# Patient Record
Sex: Female | Born: 1960 | Race: White | Hispanic: No | Marital: Married | Smoking: Former smoker
Health system: Southern US, Community
[De-identification: ages and names within clinical notes are randomized; demographics above are authoritative.]

## PROBLEM LIST (undated history)

## (undated) DIAGNOSIS — K219 Gastro-esophageal reflux disease without esophagitis: Secondary | ICD-10-CM

## (undated) DIAGNOSIS — E785 Hyperlipidemia, unspecified: Secondary | ICD-10-CM

## (undated) DIAGNOSIS — F419 Anxiety disorder, unspecified: Secondary | ICD-10-CM

## (undated) DIAGNOSIS — I10 Essential (primary) hypertension: Secondary | ICD-10-CM

## (undated) DIAGNOSIS — I493 Ventricular premature depolarization: Secondary | ICD-10-CM

## (undated) DIAGNOSIS — Z98891 History of uterine scar from previous surgery: Secondary | ICD-10-CM

## (undated) DIAGNOSIS — R002 Palpitations: Secondary | ICD-10-CM

## (undated) DIAGNOSIS — Z9889 Other specified postprocedural states: Secondary | ICD-10-CM

## (undated) DIAGNOSIS — R112 Nausea with vomiting, unspecified: Secondary | ICD-10-CM

## (undated) DIAGNOSIS — Z8719 Personal history of other diseases of the digestive system: Secondary | ICD-10-CM

## (undated) DIAGNOSIS — I491 Atrial premature depolarization: Secondary | ICD-10-CM

## (undated) DIAGNOSIS — Z9049 Acquired absence of other specified parts of digestive tract: Secondary | ICD-10-CM

## (undated) DIAGNOSIS — M47819 Spondylosis without myelopathy or radiculopathy, site unspecified: Secondary | ICD-10-CM

## (undated) DIAGNOSIS — B1920 Unspecified viral hepatitis C without hepatic coma: Secondary | ICD-10-CM

## (undated) HISTORY — DX: Gastro-esophageal reflux disease without esophagitis: K21.9

## (undated) HISTORY — DX: Spondylosis without myelopathy or radiculopathy, site unspecified: M47.819

## (undated) HISTORY — DX: Hyperlipidemia, unspecified: E78.5

## (undated) HISTORY — DX: Acquired absence of other specified parts of digestive tract: Z90.49

## (undated) HISTORY — DX: Atrial premature depolarization: I49.1

## (undated) HISTORY — DX: Ventricular premature depolarization: I49.3

## (undated) HISTORY — DX: History of uterine scar from previous surgery: Z98.891

## (undated) HISTORY — PX: CHOLECYSTECTOMY: SHX55

---

## 1997-08-06 ENCOUNTER — Other Ambulatory Visit: Admission: RE | Admit: 1997-08-06 | Discharge: 1997-08-06 | Payer: Self-pay | Admitting: Obstetrics and Gynecology

## 1998-08-26 ENCOUNTER — Other Ambulatory Visit: Admission: RE | Admit: 1998-08-26 | Discharge: 1998-08-26 | Payer: Self-pay | Admitting: Obstetrics and Gynecology

## 1998-09-03 ENCOUNTER — Encounter: Payer: Self-pay | Admitting: Emergency Medicine

## 1998-09-03 ENCOUNTER — Emergency Department (HOSPITAL_COMMUNITY): Admission: EM | Admit: 1998-09-03 | Discharge: 1998-09-03 | Payer: Self-pay | Admitting: Emergency Medicine

## 1999-02-17 ENCOUNTER — Encounter: Payer: Self-pay | Admitting: Emergency Medicine

## 1999-02-17 ENCOUNTER — Emergency Department (HOSPITAL_COMMUNITY): Admission: EM | Admit: 1999-02-17 | Discharge: 1999-02-17 | Payer: Self-pay | Admitting: Emergency Medicine

## 1999-11-06 ENCOUNTER — Other Ambulatory Visit: Admission: RE | Admit: 1999-11-06 | Discharge: 1999-11-06 | Payer: Self-pay | Admitting: Obstetrics and Gynecology

## 2000-12-18 ENCOUNTER — Other Ambulatory Visit: Admission: RE | Admit: 2000-12-18 | Discharge: 2000-12-18 | Payer: Self-pay | Admitting: Obstetrics and Gynecology

## 2002-01-06 ENCOUNTER — Other Ambulatory Visit: Admission: RE | Admit: 2002-01-06 | Discharge: 2002-01-06 | Payer: Self-pay | Admitting: Obstetrics and Gynecology

## 2002-04-28 ENCOUNTER — Ambulatory Visit (HOSPITAL_COMMUNITY): Admission: RE | Admit: 2002-04-28 | Discharge: 2002-04-28 | Payer: Self-pay | Admitting: Gastroenterology

## 2002-05-13 ENCOUNTER — Encounter: Payer: Self-pay | Admitting: Gastroenterology

## 2002-05-13 ENCOUNTER — Ambulatory Visit (HOSPITAL_COMMUNITY): Admission: RE | Admit: 2002-05-13 | Discharge: 2002-05-13 | Payer: Self-pay | Admitting: Gastroenterology

## 2002-06-16 ENCOUNTER — Encounter: Payer: Self-pay | Admitting: General Surgery

## 2002-06-18 ENCOUNTER — Encounter (INDEPENDENT_AMBULATORY_CARE_PROVIDER_SITE_OTHER): Payer: Self-pay | Admitting: Specialist

## 2002-06-18 ENCOUNTER — Ambulatory Visit (HOSPITAL_COMMUNITY): Admission: RE | Admit: 2002-06-18 | Discharge: 2002-06-19 | Payer: Self-pay | Admitting: General Surgery

## 2002-06-18 ENCOUNTER — Encounter: Payer: Self-pay | Admitting: General Surgery

## 2002-09-21 ENCOUNTER — Emergency Department (HOSPITAL_COMMUNITY): Admission: EM | Admit: 2002-09-21 | Discharge: 2002-09-22 | Payer: Self-pay | Admitting: Emergency Medicine

## 2002-12-17 ENCOUNTER — Emergency Department (HOSPITAL_COMMUNITY): Admission: EM | Admit: 2002-12-17 | Discharge: 2002-12-18 | Payer: Self-pay | Admitting: *Deleted

## 2002-12-18 ENCOUNTER — Encounter: Payer: Self-pay | Admitting: Emergency Medicine

## 2003-03-30 ENCOUNTER — Other Ambulatory Visit: Admission: RE | Admit: 2003-03-30 | Discharge: 2003-03-30 | Payer: Self-pay | Admitting: Obstetrics and Gynecology

## 2003-08-16 ENCOUNTER — Encounter: Admission: RE | Admit: 2003-08-16 | Discharge: 2003-08-16 | Payer: Self-pay | Admitting: Gastroenterology

## 2004-06-13 ENCOUNTER — Other Ambulatory Visit: Admission: RE | Admit: 2004-06-13 | Discharge: 2004-06-13 | Payer: Self-pay | Admitting: Obstetrics and Gynecology

## 2005-05-29 ENCOUNTER — Encounter: Admission: RE | Admit: 2005-05-29 | Discharge: 2005-05-29 | Payer: Self-pay | Admitting: Gastroenterology

## 2006-06-16 ENCOUNTER — Emergency Department (HOSPITAL_COMMUNITY): Admission: EM | Admit: 2006-06-16 | Discharge: 2006-06-17 | Payer: Self-pay | Admitting: Emergency Medicine

## 2008-03-01 ENCOUNTER — Encounter: Admission: RE | Admit: 2008-03-01 | Discharge: 2008-03-01 | Payer: Self-pay | Admitting: Obstetrics and Gynecology

## 2009-04-14 ENCOUNTER — Encounter: Admission: RE | Admit: 2009-04-14 | Discharge: 2009-05-11 | Payer: Self-pay | Admitting: Orthopaedic Surgery

## 2010-09-01 NOTE — Op Note (Signed)
   NAME:  Caroline Keller, Caroline Keller                            ACCOUNT NO.:  [id number]   MEDICAL RECORD NO.:  [id number]                   PATIENT TYPE:  AMB   LOCATION:  ENDO                                 FACILITY:  [facility]   PHYSICIAN:  Danise Edge, M.D.                DATE OF BIRTH:  [birth year]   DATE OF PROCEDURE:  04/28/2002  DATE OF DISCHARGE:                                 OPERATIVE REPORT   PROCEDURE INDICATION:  The patient is a 50 year old female born December 07, 1960.  The patient has unexplained right upper quadrant abdominal pressure  discomfort.  Her abdominal ultrasound was normal.   ENDOSCOPIST:  Danise Edge, M.D.   PREMEDICATION:  Versed 10 mg, Demerol 50 mg.   ENDOSCOPE:  Olympus gastroscope.   DESCRIPTION OF PROCEDURE:  After obtaining informed consent, the patient was  placed in the left lateral decubitus position.  I administered intravenous  Demerol and intravenous Versed to achieve conscious sedation for the  procedure.  The patient's blood pressure, oxygen saturation and cardiac  rhythm were monitored throughout the procedure and documented in the medical  record.   The Olympus gastroscope was passed through the posterior hypopharynx into  the proximal esophagus without difficulty.  The hypopharynx, larynx and  vocal cords appeared normal.   Esophagoscopy:  The proximal, mid and lower segments of the esophagus appear  normal.   Gastroscopy:  Retroflexed view of the gastric cardia and fundus was normal.  The gastric body, antrum and pylorus appeared normal.   Duodenoscopy:  The duodenal bulb and descending duodenum appeared normal.   Biopsy:  A biopsy was taken from the distal gastric antrum for CLOtest to  rule out Helicobacter pylori antral gastritis.    ASSESSMENT:  Normal esophagogastroduodenoscopy.  CLOtest to rule out  Helicobacter pylori antral gastritis pending.                                                Danise Edge, M.D.    MJ/MEDQ   D:  04/28/2002  T:  04/28/2002  Job:  [id number]   cc:   Dellis Anes. Idell Pickles, M.D.  [address]  [place]  [place] [address]  Fax: [phone]

## 2010-09-01 NOTE — Op Note (Signed)
NAME:  Caroline Keller, Caroline Keller                            ACCOUNT NO.:  [id number]   MEDICAL RECORD NO.:  [id number]                   PATIENT TYPE:  OIB   LOCATION:  5707                                 FACILITY:  [facility]   PHYSICIAN:  Ollen Gross. Vernell Morgans, M.D.              DATE OF BIRTH:  July 22, 1960   DATE OF PROCEDURE:  06/18/2002  DATE OF DISCHARGE:  06/19/2002                                 OPERATIVE REPORT   PREOPERATIVE DIAGNOSIS:  Cholecystitis with cholelithiasis and hepatitis.   POSTOPERATIVE DIAGNOSIS:  Cholecystitis with cholelithiasis and hepatitis.   PROCEDURE:  Laparoscopic cholecystectomy with intraoperative cholangiogram  and liver biopsy.   SURGEON:  Ollen Gross. Carolynne Edouard, M.D.   ASSISTANT:  Velora Heckler, M.D.   ANESTHESIA:  General endotracheal.   DESCRIPTION OF PROCEDURE:  After informed consent was obtained, the patient  was brought to the operating room and placed in the supine position on the  operating room table.  After adequate induction of general anesthesia, the  patient's abdomen was prepped with Betadine and draped in the usual sterile  manner.  The area below the umbilicus was infiltrated with 0.25% Marcaine  and a small incision was made with a 15 blade knife.  This incision was  carried down through the subcutaneous tissue bluntly using the Kelly clamp  and [employer] retractors until the linea alba was identified.  The linea  alba was incised with a 15 blade knife and each side was grasped with Kocher  clamps and elevated anteriorly.  The preperitoneal space was entered bluntly  with a hemostat until the peritoneum was opened and access was gained to the  abdominal cavity.  A 0 Vicryl pursestring stitch was then placed in the  fascia surrounding the opening.  A Hasson cannula was placed through the  opening and anchored in place with the previously-placed Vicryl pursestring  stitch.  The abdomen was then insufflated with carbon dioxide without  difficulty.  The  patient was placed in the head-up position, the laparoscope  was placed through the Hasson cannula, and the dome of the gallbladder and  liver were readily identifiable.  The liver had a normal appearance.  The  epigastric region was then infiltrated with 0.25% Marcaine and a small  incision was made with a 15 blade knife.  A 10 mm port bluntly through this  incision into the abdominal cavity under direct vision.  Sites were then  chosen laterally on the right side of the abdomen with placement of 5 mm  ports.  Each of these areas was infiltrated with 0.25% Marcaine and small  stab incisions were made with a 15 blade knife, and the 5 mm ports were  placed bluntly through these incisions into the abdominal cavity under  direct vision.  A stone-grabbing device was placed through the epigastric  port and grasped the edge of the liver next to the dome  of the gallbladder,  and a small chunk of liver was removed for liver biopsy.  This area was then  coagulated with the electrocautery until it was hemostatic.  A 5 mm port was  then placed bluntly through the most lateral 5 mm port and used to grasp the  dome of the gallbladder and elevate it anteriorly and superiorly.  Another  blunt grasper was placed through the other 5 mm port and used to retract on  the body and neck of the gallbladder.  A dissector was placed through the  epigastric port and using the electrocautery, the peritoneal reflection of  the gallbladder neck was opened and blunt dissection then carried out in  this area until the gallbladder neck-cystic duct junction was readily  identified and a good window was created.  A single clip was placed on the  gallbladder neck.  A 14-gauge Angiocath was placed percutaneously through  the anterior abdominal wall under direct vision.  A Reddick cholangiogram  catheter was then placed through the Angiocath and flushed.  A small  ductotomy was made with the laparoscopic scissors just proximal  to the clip.  The Reddick catheter was placed within the cystic duct and anchored in place  with the clip.  A cholangiogram was obtained that showed no filling defects,  good length on the cystic duct, and rapid emptying into the duodenum.  The  anchoring clip and catheters were then removed from the patient and three  clips were placed proximally in the cystic duct and the duct was divided  between the two sets of clips.  Posterior to this the cystic artery was  identified and again dissected bluntly in a circumferential manner until a  good window was created.  Two clips were placed proximally and one distally  in the artery and the artery was divided between the two.  Next the  laparoscopic hook cautery device was used to separate the gallbladder from  the liver bed prior to completely detaching the gallbladder from the liver  bed.  The liver bed was inspected and several small bleeding points were  coagulated with the electrocautery.  The gallbladder was then detached the  rest of the way from the liver bed with the electrocautery without  difficulty.  An endoscopic bag was placed through the epigastric port and  the gallbladder was placed within the bag, and the bag was sealed.  The  abdomen was then irrigated with copious amounts of saline until the effluent  was clear, and the laparoscope was then moved to the upper midline port and  the gallbladder grasper was placed through the Hasson cannula and grasped  the opening of the bag.  The bag with the gallbladder was removed through  the infraumbilical port without difficulty.  The fascial defect was closed  with the previously-placed Vicryl pursestring stitch.  The rest of the ports  were removed under direct vision.  The gas was allowed to escape.  The skin  incisions were then all closed with interrupted 4-0 Monocryl subcuticular  stitches.  Benzoin and Steri-Strips were applied.  The patient tolerated the procedure well.  At the end  of the case all needle, sponge, and instrument  counts were correct.  The patient was awakened and taken to the recovery  room in stable condition.  Ollen Gross. Vernell Morgans, M.D.    PST/MEDQ  D:  07/01/2002  T:  07/01/2002  Job:  [id number]

## 2011-03-30 ENCOUNTER — Other Ambulatory Visit: Payer: Self-pay | Admitting: Family Medicine

## 2011-03-30 DIAGNOSIS — R1011 Right upper quadrant pain: Secondary | ICD-10-CM

## 2011-04-02 ENCOUNTER — Ambulatory Visit
Admission: RE | Admit: 2011-04-02 | Discharge: 2011-04-02 | Disposition: A | Discharge status: Home / Self Care | Payer: BC Managed Care – PPO | Source: Ambulatory Visit | Attending: Family Medicine | Admitting: Family Medicine

## 2011-04-02 DIAGNOSIS — R1011 Right upper quadrant pain: Secondary | ICD-10-CM

## 2011-04-18 ENCOUNTER — Encounter: Payer: Self-pay | Admitting: *Deleted

## 2011-04-18 ENCOUNTER — Emergency Department (HOSPITAL_COMMUNITY): Payer: BC Managed Care – PPO

## 2011-04-18 ENCOUNTER — Emergency Department (HOSPITAL_COMMUNITY)
Admission: EM | Admit: 2011-04-18 | Discharge: 2011-04-18 | Disposition: A | Discharge status: Home / Self Care | Payer: BC Managed Care – PPO | Attending: Emergency Medicine | Admitting: Emergency Medicine

## 2011-04-18 DIAGNOSIS — I10 Essential (primary) hypertension: Secondary | ICD-10-CM | POA: Insufficient documentation

## 2011-04-18 DIAGNOSIS — R109 Unspecified abdominal pain: Secondary | ICD-10-CM | POA: Insufficient documentation

## 2011-04-18 DIAGNOSIS — Z9089 Acquired absence of other organs: Secondary | ICD-10-CM | POA: Insufficient documentation

## 2011-04-18 DIAGNOSIS — Z8619 Personal history of other infectious and parasitic diseases: Secondary | ICD-10-CM | POA: Insufficient documentation

## 2011-04-18 HISTORY — DX: Essential (primary) hypertension: I10

## 2011-04-18 HISTORY — DX: Unspecified viral hepatitis C without hepatic coma: B19.20

## 2011-04-18 LAB — HEPATIC FUNCTION PANEL
ALT: 24 U/L (ref 0–35)
AST: 21 U/L (ref 0–37)
Albumin: 4.6 g/dL (ref 3.5–5.2)
Total Bilirubin: 0.3 mg/dL (ref 0.3–1.2)

## 2011-04-18 LAB — BASIC METABOLIC PANEL
Calcium: 10.3 mg/dL (ref 8.4–10.5)
Creatinine, Ser: 0.89 mg/dL (ref 0.50–1.10)
GFR calc non Af Amer: 73 mL/min — ABNORMAL LOW (ref >90)
Sodium: 136 mEq/L (ref 135–145)

## 2011-04-18 LAB — CBC
Hemoglobin: 13.6 g/dL (ref 12.0–15.0)
MCHC: 35.2 g/dL (ref 30.0–36.0)
MCV: 87.1 fL (ref 78.0–100.0)
Platelets: 208 10*3/uL (ref 150–400)
RDW: 11.9 % (ref 11.5–15.5)
WBC: 6.4 10*3/uL (ref 4.0–10.5)

## 2011-04-18 LAB — URINALYSIS, ROUTINE W REFLEX MICROSCOPIC
Bilirubin Urine: NEGATIVE
Hgb urine dipstick: NEGATIVE
Leukocytes, UA: NEGATIVE
Nitrite: NEGATIVE
Specific Gravity, Urine: 1.018 (ref 1.005–1.030)

## 2011-04-18 LAB — LIPASE, BLOOD: Lipase: 47 U/L (ref 11–59)

## 2011-04-18 LAB — DIFFERENTIAL
Eosinophils Relative: 0 % (ref 0–5)
Lymphocytes Relative: 21 % (ref 12–46)
Monocytes Relative: 6 % (ref 3–12)
Neutro Abs: 4.6 10*3/uL (ref 1.7–7.7)
Neutrophils Relative %: 73 % (ref 43–77)

## 2011-04-18 MED ORDER — MORPHINE SULFATE 4 MG/ML IJ SOLN
4.0000 mg | Freq: Once | INTRAMUSCULAR | Status: AC
  Administered 2011-04-18: 4 mg via INTRAVENOUS
  Filled 2011-04-18: qty 1

## 2011-04-18 MED ORDER — SODIUM CHLORIDE 0.9 % IV SOLN
Freq: Once | INTRAVENOUS | Status: AC
  Administered 2011-04-18: 15:00:00 via INTRAVENOUS

## 2011-04-18 MED ORDER — ONDANSETRON HCL 4 MG/2ML IJ SOLN
4.0000 mg | Freq: Once | INTRAMUSCULAR | Status: AC
  Administered 2011-04-18: 4 mg via INTRAVENOUS
  Filled 2011-04-18: qty 2

## 2011-04-18 MED ORDER — OXYCODONE-ACETAMINOPHEN 5-325 MG PO TABS: 1.0000 | ORAL_TABLET | Freq: Four times a day (QID) | ORAL | Status: AC | PRN

## 2011-04-18 NOTE — ED Notes (Signed)
Pt states "approx 4 wks ago I saw my doctor & told her my stomach was hurting, I've already had my gallbladder out, she ran blood tests, ordered an [place], , my numbers for my Hep C are up a little bit, I have an appt with Dr. Randa Evens 1/23 but the pain on the right flank is getting worse"

## 2011-04-18 NOTE — ED Notes (Signed)
Pt states has a hx Hep C, does not want any information shared with children if they happen to be in the room.

## 2011-04-18 NOTE — ED Provider Notes (Addendum)
History     CSN: [id number]  Arrival date & time 04/18/11  1137   First MD Initiated Contact with Patient 04/18/11 1359      Chief Complaint  Patient presents with  . Abdominal Pain    (Consider location/radiation/quality/duration/timing/severity/associated sxs/prior treatment) HPI Comments: Four week history of ruq abd pain.  She has a history of lap chole in the past.  No n/v/d.  No fevers or chills.    Patient is a 51 y.o. female presenting with abdominal pain. The history is provided by the patient.  Abdominal Pain The primary symptoms of the illness include abdominal pain. The primary symptoms of the illness do not include fever, fatigue, nausea, vomiting, diarrhea or dysuria. Episode onset: one month ago. The onset of the illness was gradual. The problem has been gradually worsening.  The patient has not had a change in bowel habit. Symptoms associated with the illness do not include chills or constipation.    Past Medical History  Diagnosis Date  . Hepatitis C   . Hypertension     Past Surgical History  Procedure Date  . Cholecystectomy   . Cesarean section     No family history on file.  History  Substance Use Topics  . Smoking status: Never Smoker   . Smokeless tobacco: Not on file  . Alcohol Use: No    OB History    Grav Para Term Preterm Abortions TAB SAB Ect Mult Living                  Review of Systems  Constitutional: Negative for fever, chills and fatigue.  Gastrointestinal: Positive for abdominal pain. Negative for nausea, vomiting, diarrhea and constipation.  Genitourinary: Negative for dysuria.  All other systems reviewed and are negative.    Allergies  Review of patient's allergies indicates no known allergies.  Home Medications   Current Outpatient Rx  Name Route Sig Dispense Refill  . DILTIAZEM HCL ER COATED BEADS 360 MG PO CP24 Oral Take 360 mg by mouth every morning.      Marland Kitchen MICARDIS HCT PO Oral Take 1 tablet by mouth daily. Pt  takes micardis 80mg  /hctz combo medication. Not sure  of dosage for hctz       BP 106/69  Pulse 83  Temp(Src) 97.5 F (36.4 C) (Oral)  Resp 20  Wt 150 lb (68.04 kg)  SpO2 100%  Physical Exam  Nursing note and vitals reviewed. Constitutional: She is oriented to person, place, and time. She appears well-developed and well-nourished. No distress.  HENT:  Head: Normocephalic and atraumatic.  Neck: Normal range of motion. Neck supple.  Cardiovascular: Normal rate and regular rhythm.  Exam reveals no gallop and no friction rub.   No murmur heard. Pulmonary/Chest: Effort normal and breath sounds normal. No respiratory distress. She has no wheezes.  Abdominal: Soft. Bowel sounds are normal. She exhibits no distension.       There is ttp in the ruq and right flank.    Musculoskeletal: Normal range of motion.  Neurological: She is alert and oriented to person, place, and time.  Skin: Skin is warm and dry. She is not diaphoretic.    ED Course  Procedures (including critical care time)  Labs Reviewed  BASIC METABOLIC PANEL - Abnormal; Notable for the following:    GFR calc non Af Amer 73 (*)    GFR calc Af Amer 85 (*)    All other components within normal limits  URINALYSIS, ROUTINE W REFLEX  MICROSCOPIC - Abnormal; Notable for the following:    APPearance CLOUDY (*)    All other components within normal limits  CBC  DIFFERENTIAL  HEPATIC FUNCTION PANEL  LIPASE, BLOOD   Ct Abdomen Pelvis Wo Contrast  04/18/2011  *RADIOLOGY REPORT*  Clinical Data: Right flank pain  CT ABDOMEN AND PELVIS WITHOUT CONTRAST  Technique:  Multidetector CT imaging of the abdomen and pelvis was performed following the standard protocol without intravenous contrast.  Comparison: Ultrasound 04/02/2011  Findings: Lung bases are clear.  No pleural or pericardial fluid. The liver appears normal without contrast.  There is been previous cholecystectomy.  The spleen is normal.  The pancreas is normal. The adrenal  glands are normal.  No evidence of renal stone disease or obstruction.  1 cm cyst in the midportion of the right kidney as seen at the previous ultrasound.  The aorta and IVC are normal.  No retroperitoneal mass or adenopathy.  No free intraperitoneal fluid or air.  Normal appearing appendix.  Uterus and adnexal regions appear unremarkable.  No significant osseous finding.  IMPRESSION: No evidence of urinary tract stone disease.  No cause right-sided pain is identified.  Normal appearing appendix.  Original Report Authenticated By: Thomasenia Sales, M.D.     No diagnosis found.    MDM  The ct does not show an obvious cause of the pain.  The labs look fine and the patient is feeling better with meds.  Will discharge to home with pain meds.  While she was here, the GI clinic called with an available appointment.  She was advised to make and keep this appointment.        Geoffery Lyons, MD 04/18/11 1615  Geoffery Lyons, MD 05/16/11 [phone]

## 2011-04-18 NOTE — ED Notes (Signed)
Morphine 1 mg given at a time, Patient states pain meds make her vomit.

## 2011-04-18 NOTE — ED Notes (Signed)
Patient transported to CT 

## 2011-04-18 NOTE — ED Notes (Signed)
Returned from CT.

## 2011-04-18 NOTE — ED Notes (Signed)
Morphine 2 mg (last 2 of 4) given iv

## 2011-04-19 ENCOUNTER — Other Ambulatory Visit: Payer: Self-pay | Admitting: Gastroenterology

## 2011-04-19 DIAGNOSIS — R1013 Epigastric pain: Secondary | ICD-10-CM

## 2011-04-24 ENCOUNTER — Ambulatory Visit
Admission: RE | Admit: 2011-04-24 | Discharge: 2011-04-24 | Disposition: A | Discharge status: Home / Self Care | Payer: BC Managed Care – PPO | Source: Ambulatory Visit | Attending: Gastroenterology | Admitting: Gastroenterology

## 2011-04-24 DIAGNOSIS — R1013 Epigastric pain: Secondary | ICD-10-CM

## 2011-04-24 MED ORDER — GADOBENATE DIMEGLUMINE 529 MG/ML IV SOLN
15.0000 mL | Freq: Once | INTRAVENOUS | Status: AC | PRN
  Administered 2011-04-24: 15 mL via INTRAVENOUS

## 2011-06-26 ENCOUNTER — Other Ambulatory Visit (HOSPITAL_COMMUNITY): Payer: Self-pay | Admitting: Gastroenterology

## 2011-06-26 DIAGNOSIS — B182 Chronic viral hepatitis C: Secondary | ICD-10-CM

## 2011-07-02 ENCOUNTER — Encounter (HOSPITAL_COMMUNITY): Payer: Self-pay | Admitting: Pharmacy Technician

## 2011-07-02 ENCOUNTER — Other Ambulatory Visit: Payer: Self-pay | Admitting: Radiology

## 2011-07-04 ENCOUNTER — Other Ambulatory Visit: Payer: Self-pay | Admitting: Radiology

## 2011-07-05 ENCOUNTER — Ambulatory Visit (HOSPITAL_COMMUNITY)
Admission: RE | Admit: 2011-07-05 | Discharge: 2011-07-05 | Disposition: A | Discharge status: Home / Self Care | Payer: BC Managed Care – PPO | Source: Ambulatory Visit | Attending: Gastroenterology | Admitting: Gastroenterology

## 2011-07-05 ENCOUNTER — Encounter (HOSPITAL_COMMUNITY): Payer: Self-pay

## 2011-07-05 DIAGNOSIS — B182 Chronic viral hepatitis C: Secondary | ICD-10-CM | POA: Insufficient documentation

## 2011-07-05 DIAGNOSIS — I10 Essential (primary) hypertension: Secondary | ICD-10-CM | POA: Insufficient documentation

## 2011-07-05 LAB — CBC
Hemoglobin: 12.9 g/dL (ref 12.0–15.0)
MCH: 30.7 pg (ref 26.0–34.0)
MCHC: 34.4 g/dL (ref 30.0–36.0)
MCV: 89.3 fL (ref 78.0–100.0)
RBC: 4.2 MIL/uL (ref 3.87–5.11)

## 2011-07-05 MED ORDER — SODIUM CHLORIDE 0.9 % IV SOLN
INTRAVENOUS | Status: AC | PRN
  Administered 2011-07-05: 75 mL/h via INTRAVENOUS

## 2011-07-05 MED ORDER — HYDROCODONE-ACETAMINOPHEN 5-325 MG PO TABS
1.0000 | ORAL_TABLET | Freq: Four times a day (QID) | ORAL | Status: DC | PRN
  Administered 2011-07-05: 1 via ORAL

## 2011-07-05 MED ORDER — FENTANYL CITRATE 0.05 MG/ML IJ SOLN
INTRAMUSCULAR | Status: AC | PRN
  Administered 2011-07-05: 25 ug via INTRAVENOUS
  Administered 2011-07-05 (×2): 50 ug via INTRAVENOUS

## 2011-07-05 MED ORDER — MIDAZOLAM HCL 5 MG/5ML IJ SOLN
INTRAMUSCULAR | Status: AC | PRN
Start: 2011-07-05 — End: 2011-07-05
  Administered 2011-07-05 (×2): 1 mg via INTRAVENOUS
  Administered 2011-07-05: 0.5 mg via INTRAVENOUS

## 2011-07-05 MED ORDER — SODIUM CHLORIDE 0.9 % IV SOLN
Freq: Once | INTRAVENOUS | Status: AC
  Administered 2011-07-05: 09:00:00 via INTRAVENOUS

## 2011-07-05 MED ORDER — MIDAZOLAM HCL 2 MG/2ML IJ SOLN
INTRAMUSCULAR | Status: AC
  Filled 2011-07-05: qty 4

## 2011-07-05 MED ORDER — FENTANYL CITRATE 0.05 MG/ML IJ SOLN
INTRAMUSCULAR | Status: AC
  Filled 2011-07-05: qty 4

## 2011-07-05 MED ORDER — SODIUM CHLORIDE 0.9 % IV SOLN: INTRAVENOUS | Status: DC

## 2011-07-05 NOTE — H&P (Signed)
Caroline Keller is an 51 y.o. female.   Chief Complaint: Hepatitis HPI: Pt with known hx of Hepatitis C scheduled for US liver biopsy.  No new c/o this am.  Past Medical History  Diagnosis Date  . Hepatitis C   . Hypertension     Past Surgical History  Procedure Date  . Cholecystectomy   . Cesarean section     No family history on file. Social History:  reports that she has never smoked. She does not have any smokeless tobacco history on file. She reports that she does not drink alcohol or use illicit drugs.  Allergies: No Known Allergies  Medications Prior to Admission  Medication Sig Dispense Refill  . diltiazem (CARDIZEM CD) 360 MG 24 hr capsule Take 360 mg by mouth every morning.        Marland Kitchen telmisartan-hydrochlorothiazide (MICARDIS HCT) 80-12.5 MG per tablet Take 1 tablet by mouth daily.       Medications Prior to Admission  Medication Dose Route Frequency Provider Last Rate Last Dose  . 0.9 %  sodium chloride infusion   Intravenous Once Robet Leu, PA 20 mL/hr at 07/05/11 0908    . fentaNYL (SUBLIMAZE) 0.05 MG/ML injection           . midazolam (VERSED) 2 MG/2ML injection             Results for orders placed during the hospital encounter of 07/05/11 (from the past 48 hour(s))  APTT     Status: Normal   Collection Time   07/05/11  8:56 AM      Component Value Range Comment   aPTT 27  24 - 37 (seconds)   CBC     Status: Normal   Collection Time   07/05/11  8:56 AM      Component Value Range Comment   WBC 4.1  4.0 - 10.5 (K/uL)    RBC 4.20  3.87 - 5.11 (MIL/uL)    Hemoglobin 12.9  12.0 - 15.0 (g/dL)    HCT 16.1  09.6 - 04.5 (%)    MCV 89.3  78.0 - 100.0 (fL)    MCH 30.7  26.0 - 34.0 (pg)    MCHC 34.4  30.0 - 36.0 (g/dL)    RDW 40.9  81.1 - 91.4 (%)    Platelets 197  150 - 400 (K/uL)   PROTIME-INR     Status: Normal   Collection Time   07/05/11  8:56 AM      Component Value Range Comment   Prothrombin Time 13.6  11.6 - 15.2 (seconds)    INR 1.02  0.00 - 1.49       No results found.  Review of Systems  Constitutional: Negative for fever and chills.  Eyes: Negative for blurred vision.  Respiratory: Negative for cough and shortness of breath.   Cardiovascular: Negative for chest pain and palpitations.  Gastrointestinal: Negative for nausea and abdominal pain.  Neurological: Negative.  Negative for headaches.    Blood pressure 101/66, pulse 64, temperature 97 F (36.1 C), temperature source Oral, resp. rate 20, height 5' 5.5" (1.664 m), weight 160 lb (72.576 kg), SpO2 98.00%. Physical Exam  Constitutional: She is oriented to person, place, and time. She appears well-developed and well-nourished. No distress.  HENT:  Head: Normocephalic and atraumatic.  Cardiovascular: Normal rate, regular rhythm and normal heart sounds.   No murmur heard. Respiratory: Effort normal and breath sounds normal. She has no wheezes.  GI: Soft. Bowel sounds are normal.  She exhibits no distension. There is no tenderness.  Neurological: She is alert and oriented to person, place, and time.     Assessment/Plan Hepatitis C Proceed with US guided liver biopsy. Procedure, including risks, complications has been reviewed. Questions answered. Consent signed and in chart.  Brayton El 07/05/2011, 9:45 AM

## 2011-07-05 NOTE — Discharge Instructions (Addendum)
Hepatitis C Hepatitis C is a viral infection of the liver. Infection may go undetected for months or years because symptoms may be absent or very mild. Chronic liver disease is the main danger of hepatitis C. This may lead to scarring of the liver (cirrhosis), liver failure, and liver cancer. CAUSES  Hepatitis C is caused by the hepatitis C virus (HCV). Formerly, hepatitis C infections were most commonly transmitted through blood transfusions. In the early 1990s, routine testing of donated blood for hepatitis C and exclusion of blood that tests positive for HCV began. Now, HCV is most commonly transmitted from person to person through injection drug use, sharing needles, or sex with an infected person. A caregiver may also get the infection from exposure to the blood of an infected patient by way of a cut or needle stick.  SYMPTOMS  Acute Phase Many cases of acute HCV infection are mild and cause few problems.Some people may not even realize they are sick.Symptoms in others may last a few weeks to several months and include:  Feeling very tired.   Loss of appetite.   Nausea.   Vomiting.   Abdominal pain.   Dark yellow urine.   Yellow skin and eyes (jaundice).   Itching of the skin.  Chronic Phase  Between 50% to 85% of people who get HCV infection become "chronic carriers." They often have no symptoms, but the virus stays in their body.They may spread the virus to others and can get long-term liver disease.   Many people with chronic HCV infection remain healthy for many years. However, up to 1 in 5 chronically infected people may develop severe liver diseases including scarring of the liver (cirrhosis), liver failure, or liver cancer.  DIAGNOSIS  Diagnosis of hepatitis C infection is made by testing blood for the presence of hepatitis C viral particles called RNA. Other tests may also be done to measure the status of current liver function, exclude other liver problems, or assess  liver damage. TREATMENT  Treatment with many antiviral drugs is available and recommended for some patients with chronic HCV infection. Drug treatment is generally considered appropriate for patients who:  Are 53 years of age or older.   Have a positive test for HCV particles in the blood.   Have a liver tissue sample (biopsy) that shows chronic hepatitis and significant scarring (fibrosis).   Do not have signs of liver failure.   Have acceptable blood test results that confirm the wellness of other body organs.   Are willing to be treated and conform to treatment requirements.   Have no other circumstances that would prevent treatment from being recommended (contraindications).  All people who are offered and choose to receive drug treatment must understand that careful medical follow up for many months and even years is crucial in order to make successful care possible. The goal of drug treatment is to eliminate any evidence of HCV in the blood on a long-term basis. This is called a "sustained virologic response" or SVR. Achieving a SVR is associated with a decrease in the chance of life-threatening liver problems, need for a liver transplant, liver cancer rates, and liver-related complications. Successful treatment currently requires taking treatment drugs for at least 24 weeks and up to 72 weeks. An injected drug (interferon) given weekly and an oral antiviral medicine taken daily are usually prescribed. Side effects from these drugs are common and some may be very serious. Your response to treatment must be carefully monitored by both you and  your caregiver throughout the entire treatment period. PREVENTION There is no vaccine for hepatitis C. The only way to prevent the disease is to reduce the risk of exposure to the virus.   Avoid sharing drug needles or personal items like toothbrushes, razors, and nail clippers with an infected person.   Healthcare workers need to avoid injuries and  wear appropriate protective equipment such as gloves, gowns, and face masks when performing invasive medical or nursing procedures.  HOME CARE INSTRUCTIONS  To avoid making your liver disease worse:  Strictly avoid drinking alcohol.   Carefully review all new prescriptions of medicines with your caregiver. Ask your caregiver which drugs you should avoid. The following drugs are toxic to the liver, and your caregiver may tell you to avoid them:   Isoniazid.   Methyldopa.   Acetaminophen.   Anabolic steroids (muscle-building drugs).   Erythromycin.   Oral contraceptives (birth control pills).   Check with your caregiver to make sure medicine you are currently taking will not be harmful.   Periodic blood tests may be required. Follow your caregiver's advice about when you should have blood tests.   Avoid a sexual relationship until advised otherwise by your caregiver.   Avoid activities that could expose other people to your blood. Examples include sharing a toothbrush, nail clippers, razors, and needles.   Bed rest is not necessary, but it may make you feel better. Recovery time is not related to the amount of rest you receive.   This infection is contagious. Follow your caregiver's instructions in order to avoid spread of the infection.  SEEK IMMEDIATE MEDICAL CARE IF:  You have increasing fatigue or weakness.   You have an oral temperature above 102 F (38.9 C), not controlled by medicine.   You develop loss of appetite, nausea, or vomiting.   You develop jaundice.   You develop easy bruising or bleeding.   You develop any severe problems as a result of your treatment.  MAKE SURE YOU:   Understand these instructions.   Will watch your condition.   Will get help right away if you are not doing well or get worse.  Document Released: 03/30/2000 Document Revised: 03/22/2011 Document Reviewed: 08/02/2010 [facility] Patient Information 2012 [place], [place].Biopsy A  biopsy is a procedure in which small samples of tissue are removed from the body. The tissue is examined under a microscope. A biopsy may be done to determine the cause (diagnosis) of a condition or mass (tumor). A biopsy may also be done to determine the best treatment for you. In some instances, a biopsy may be performed on normal tissue to determine if cancer has spread or if a transplanted organ is being rejected. There are 2 ways to obtain samples:  Fine needle biopsy. Samples are removed using a thin needle inserted through the skin.   Open biopsy. Samples are removed after a cut (incision) is made through the skin.  LET YOUR CAREGIVER KNOW ABOUT:  Allergies to food or medicine.   Medicines taken, including vitamins, herbs, eyedrops, over-the-counter medicines, and creams.   Use of steroids (by mouth or creams).   Previous problems with anesthetics or numbing medicines.   History of bleeding problems or blood clots.   Previous surgery.   Other health problems, including diabetes and kidney problems.   Possibility of pregnancy, if this applies.  RISKS AND COMPLICATIONS  Bleeding from the biopsy site. The risk of bleeding is higher if you have a bleeding disorder or are taking any  blood thinning medicines (anticoagulants).   Infection.   Injury to organs or structures near the biopsy site.   Chronic pain at the biopsy site. This is defined as pain that lasts for more than 3 months.   Very rarely, a second biopsy may be required if not enough tissue was collected during the first biopsy.  BEFORE THE PROCEDURE Ask your caregiver what time you need to arrive for your procedure. Ask your caregiver whether you need to stop eating or drinking (fast) before your procedure. Ask your caregiver about changing or stopping your regular medicines. A blood sample may be done to determine your blood clotting time. Medicine may be given to help you relax (sedative). PROCEDURE During a fine  needle biopsy, you will be awake during the procedure. You will be positioned to allow the best possible access to the biopsy site. Let your caregiver know if the position is not comfortable. The biopsy site will be cleaned. A needle is inserted through your skin. You may feel mild discomfort during this procedure. The needle is withdrawn once tissue samples have been removed. Pressure may be applied to the biopsy site to reduce swelling and to ensure that bleeding has stopped. The samples will be sent to be examined. During an open biopsy, you may be given medicine that numbs the area (local anesthetic) or medicine that makes you sleep (general anesthetic). An incision is made through the skin. A tissue sample or the entire mass is removed. The sample or mass will be sent to be examined. Sometimes, the sample or mass may be examined during the procedure. If the sample or mass contains cancer cells, further tissue or structures may be removed. The incision is then closed with stitches (sutures) or skin glue (adhesive). AFTER THE PROCEDURE Your recovery will be assessed and monitored. If there are no problems, you should be able to go home shortly after the procedure (outpatient). You will need to arrange for someone to drive you home if you received a sedative or pain relieving medicine during the procedure. Ask when your test results will be ready. Make sure you get your test results. Document Released: 03/30/2000 Document Revised: 03/22/2011 Document Reviewed: 09/28/2010 [facility] Patient Information 2012 [place], [place].Biopsy Care After Refer to this sheet in the next few weeks. These instructions provide you with information on caring for yourself after your procedure. Your caregiver may also give you more specific instructions. Your treatment has been planned according to current medical practices, but problems sometimes occur. Call your caregiver if you have any problems or questions after your  procedure. If you had a fine needle biopsy, you may have soreness at the biopsy site for 1 to 2 days. If you had an open biopsy, you may have soreness at the biopsy site for 3 to 4 days. HOME CARE INSTRUCTIONS   You may resume normal diet and activities as directed.   Change bandages (dressings) as directed. If your wound was closed with a skin glue (adhesive), it will wear off and begin to peel in 7 days.   Only take over-the-counter or prescription medicines for pain, discomfort, or fever as directed by your caregiver.   Ask your caregiver when you can bathe and get your wound wet.  SEEK IMMEDIATE MEDICAL CARE IF:   You have increased bleeding (more than a small spot) from the biopsy site.   You notice redness, swelling, or increasing pain at the biopsy site.   You have pus coming from the biopsy site.  You have a fever.   You notice a bad smell coming from the biopsy site or dressing.   You have a rash, have difficulty breathing, or have any allergic problems.  MAKE SURE YOU:   Understand these instructions.   Will watch your condition.   Will get help right away if you are not doing well or get worse.  Document Released: 10/20/2004 Document Revised: 03/22/2011 Document Reviewed: 09/28/2010 [employer] Patient Information 2012 [place], [facility]   Liver Biopsy A liver biopsy is done to confirm or prove a suspected problem. The liver is a large organ in the upper right hand of your abdomen. To do the test, the doctor puts a small needle into the right side of your abdomen. A tiny piece of liver tissue is taken and sent for testing. This should not be painful as the skin is injected with a local anesthetic that numbs the area.  HOW A BIOPSY IS PERFORMED This is often performed as a same day surgery. This can be done in a hospital or clinic. Biopsies are often done under local anesthesia which makes the area of biopsy numb. Sometimes sedation is given to help patients relax. If you  are taking blood thinning medications or medications containing aspirin, this must be discussed with your caregiver before the test. This medication may need to be stopped for up to 7 days before the procedure, or the dose may need to be changed. You should review all of your other medications with your caregiver before the test. You must remain in bed for 1 to 2 hours after the test. Having something to read may help pass the time.  LET YOUR CAREGIVERS KNOW ABOUT THE FOLLOWING:  Allergies.   Medications taken including herbs, eye drops, over -the- counter medications, and creams.   Use of steroids (by mouth or creams).   Previous problems with anesthetics or novocaine.   Possibility of pregnancy, if this applies.   History of blood clots (thrombophlebitis).   History of bleeding or blood problems.   Previous surgery.   Other health problems.  BEFORE THE PROCEDURE You should be present 60 minutes prior to your procedure or as directed. Check in at the admissions desk for filling out necessary forms if not pre-registered. There will be consent forms to sign prior to the procedure. There is a waiting area for your family while you are having your biopsy. AFTER THE PROCEDURE  After your biopsy, you will be taken to the recovery area where a nurse will watch and check your progress.   You may have to lie on your right side for 1 to 2 hours.   Your blood pressure and pulse will be checked often.   If you are having pain or feel sick, tell your nurse.   After 1 to 2 hours, if you are going home, you may sit in a chair and get dressed. The nurse will let you know when you can get up.   Once you are doing well, barring other problems, you will be allowed to go home. Once at home, putting an ice pack on your operative site may help with discomfort and keep swelling down.   You may resume a normal diet and activities as directed.   Change dressings as directed.   Only take  over-the-counter or prescription medicines for pain, discomfort, or fever as directed by your caregiver.   Call for your results as instructed by your caregiver. Remember it is your job to be sure  you get the results of your biopsy and any additional tests performed on the sample taken. Do not assume everything is fine if you do not hear from your caregiver.  HOME CARE INSTRUCTIONS   You should rest for one to two days or as instructed.   You will need to have a responsible adult take you home and stay with you overnight.   Do not lift over 5 lbs. or play contact sports for two weeks.   Do not drive for 24 hours.   Do not take medication containing aspirin or drink alcohol for one week after this test.  SEEK MEDICAL CARE IF:   There is increased bleeding (more than a small spot) from the biopsy site.   You have redness, swelling, or increasing pain in the biopsy site.   You develop swelling or pain in the abdomen.   You have an unexplained oral temperature over 102 F (38.9 C).   You notice a foul smell coming from the wound or dressing.  SEEK IMMEDIATE MEDICAL CARE IF:   You develop a rash.   You have difficulty breathing.   You have allergic problems such as itching or swelling or shortness of breath.  Document Released: 06/23/2003 Document Revised: 03/22/2011 Document Reviewed: 11/11/2007 [facility] Patient Information 2012 [place], [place].Marland Kitchen

## 2011-07-05 NOTE — ED Notes (Signed)
O2 d/c'd 

## 2011-07-05 NOTE — ED Notes (Signed)
Bandaid CDI.  Scant blood noted on bandaid that has been present since place post procedure.

## 2011-07-05 NOTE — Procedures (Signed)
Procedure : random liver core biopsy Specimen :  18 gauge x 3 specimens Complications : none immediate  Pathology sent samples.  Patient tolerated well.

## 2011-07-05 NOTE — ED Notes (Signed)
O2 2L/[place] started 

## 2011-07-09 ENCOUNTER — Telehealth (HOSPITAL_COMMUNITY): Payer: Self-pay

## 2013-01-27 DIAGNOSIS — R002 Palpitations: Secondary | ICD-10-CM

## 2013-01-29 ENCOUNTER — Telehealth: Payer: Self-pay | Admitting: *Deleted

## 2013-01-29 MED ORDER — DILTIAZEM HCL ER COATED BEADS 360 MG PO CP24: 360.0000 mg | ORAL_CAPSULE | ORAL | Status: DC

## 2013-01-29 MED ORDER — TELMISARTAN-HCTZ 80-12.5 MG PO TABS: 1.0000 | ORAL_TABLET | Freq: Every day | ORAL | Status: DC

## 2013-01-29 NOTE — Telephone Encounter (Signed)
Refilled

## 2013-02-18 ENCOUNTER — Encounter: Payer: Self-pay | Admitting: Cardiology

## 2013-06-11 ENCOUNTER — Emergency Department (HOSPITAL_BASED_OUTPATIENT_CLINIC_OR_DEPARTMENT_OTHER)
Admission: EM | Admit: 2013-06-11 | Discharge: 2013-06-11 | Disposition: A | Discharge status: Home / Self Care | Payer: BC Managed Care – PPO | Attending: Emergency Medicine | Admitting: Emergency Medicine

## 2013-06-11 ENCOUNTER — Encounter (HOSPITAL_BASED_OUTPATIENT_CLINIC_OR_DEPARTMENT_OTHER): Payer: Self-pay | Admitting: Emergency Medicine

## 2013-06-11 DIAGNOSIS — R109 Unspecified abdominal pain: Secondary | ICD-10-CM | POA: Insufficient documentation

## 2013-06-11 DIAGNOSIS — I10 Essential (primary) hypertension: Secondary | ICD-10-CM | POA: Insufficient documentation

## 2013-06-11 DIAGNOSIS — Z79899 Other long term (current) drug therapy: Secondary | ICD-10-CM | POA: Insufficient documentation

## 2013-06-11 DIAGNOSIS — Z8619 Personal history of other infectious and parasitic diseases: Secondary | ICD-10-CM | POA: Insufficient documentation

## 2013-06-11 DIAGNOSIS — N390 Urinary tract infection, site not specified: Secondary | ICD-10-CM | POA: Insufficient documentation

## 2013-06-11 LAB — URINALYSIS, ROUTINE W REFLEX MICROSCOPIC
Bilirubin Urine: NEGATIVE
Glucose, UA: NEGATIVE mg/dL
KETONES UR: NEGATIVE mg/dL
NITRITE: NEGATIVE
PH: 5 (ref 5.0–8.0)
PROTEIN: NEGATIVE mg/dL
Specific Gravity, Urine: 1.026 (ref 1.005–1.030)
UROBILINOGEN UA: 0.2 mg/dL (ref 0.0–1.0)

## 2013-06-11 LAB — URINE MICROSCOPIC-ADD ON

## 2013-06-11 MED ORDER — PHENAZOPYRIDINE HCL 100 MG PO TABS
95.0000 mg | ORAL_TABLET | Freq: Once | ORAL | Status: AC
  Administered 2013-06-11: 100 mg via ORAL
  Filled 2013-06-11: qty 1

## 2013-06-11 MED ORDER — SULFAMETHOXAZOLE-TMP DS 800-160 MG PO TABS
1.0000 | ORAL_TABLET | Freq: Once | ORAL | Status: AC
  Administered 2013-06-11: 1 via ORAL
  Filled 2013-06-11: qty 1

## 2013-06-11 MED ORDER — SULFAMETHOXAZOLE-TRIMETHOPRIM 800-160 MG PO TABS: 1.0000 | ORAL_TABLET | Freq: Two times a day (BID) | ORAL | Status: DC

## 2013-06-11 MED ORDER — PHENAZOPYRIDINE HCL 100 MG PO TABS: 100.0000 mg | ORAL_TABLET | Freq: Three times a day (TID) | ORAL | Status: DC

## 2013-06-11 NOTE — ED Notes (Signed)
I explained Pt why she needed to be in a gown, however Pt refused to get in the gown.  I informed the Pts RN Fannie KneeSue.

## 2013-06-11 NOTE — Discharge Instructions (Signed)
Urinary Tract Infection  Urinary tract infections (UTIs) can develop anywhere along your urinary tract. Your urinary tract is your body's drainage system for removing wastes and extra water. Your urinary tract includes two kidneys, two ureters, a bladder, and a urethra. Your kidneys are a pair of bean-shaped organs. Each kidney is about the size of your fist. They are located below your ribs, one on each side of your spine.  CAUSES  Infections are caused by microbes, which are microscopic organisms, including fungi, viruses, and bacteria. These organisms are so small that they can only be seen through a microscope. Bacteria are the microbes that most commonly cause UTIs.  SYMPTOMS   Symptoms of UTIs may vary by age and gender of the patient and by the location of the infection. Symptoms in young women typically include a frequent and intense urge to urinate and a painful, burning feeling in the bladder or urethra during urination. Older women and men are more likely to be tired, shaky, and weak and have muscle aches and abdominal pain. A fever may mean the infection is in your kidneys. Other symptoms of a kidney infection include pain in your back or sides below the ribs, nausea, and vomiting.  DIAGNOSIS  To diagnose a UTI, your caregiver will ask you about your symptoms. Your caregiver also will ask to provide a urine sample. The urine sample will be tested for bacteria and white blood cells. White blood cells are made by your body to help fight infection.  TREATMENT   Typically, UTIs can be treated with medication. Because most UTIs are caused by a bacterial infection, they usually can be treated with the use of antibiotics. The choice of antibiotic and length of treatment depend on your symptoms and the type of bacteria causing your infection.  HOME CARE INSTRUCTIONS   If you were prescribed antibiotics, take them exactly as your caregiver instructs you. Finish the medication even if you feel better after you  have only taken some of the medication.   Drink enough water and fluids to keep your urine clear or pale yellow.   Avoid caffeine, tea, and carbonated beverages. They tend to irritate your bladder.   Empty your bladder often. Avoid holding urine for long periods of time.   Empty your bladder before and after sexual intercourse.   After a bowel movement, women should cleanse from front to back. Use each tissue only once.  SEEK MEDICAL CARE IF:    You have back pain.   You develop a fever.   Your symptoms do not begin to resolve within 3 days.  SEEK IMMEDIATE MEDICAL CARE IF:    You have severe back pain or lower abdominal pain.   You develop chills.   You have nausea or vomiting.   You have continued burning or discomfort with urination.  MAKE SURE YOU:    Understand these instructions.   Will watch your condition.   Will get help right away if you are not doing well or get worse.  Document Released: 01/10/2005 Document Revised: 10/02/2011 Document Reviewed: 05/11/2011  [employer] Patient Information 2014 [employer].

## 2013-06-11 NOTE — ED Provider Notes (Signed)
Medical screening examination/treatment/procedure(s) were performed by non-physician practitioner and as supervising physician I was immediately available for consultation/collaboration.  EKG Interpretation   None         David H Yao, MD 06/11/13 2323 

## 2013-06-11 NOTE — ED Provider Notes (Signed)
CSN: [id number]     Arrival date & time 06/11/13  1927 History   First MD Initiated Contact with Patient 06/11/13 2018     Chief Complaint  Patient presents with  . Urinary Tract Infection     (Consider location/radiation/quality/duration/timing/severity/associated sxs/prior Treatment) HPI Comments: Having urgency, frequency and hesitancy  Patient is a 53 y.o. female presenting with urinary tract infection. The history is provided by the patient. No language interpreter was used.  Urinary Tract Infection This is a new problem. The current episode started today. The problem occurs constantly. The problem has been unchanged. Associated symptoms include abdominal pain. Pertinent negatives include no fever. Nothing aggravates the symptoms. She has tried nothing for the symptoms.    Past Medical History  Diagnosis Date  . Hepatitis C   . Hypertension    Past Surgical History  Procedure Laterality Date  . Cholecystectomy    . Cesarean section     History reviewed. No pertinent family history. History  Substance Use Topics  . Smoking status: Never Smoker   . Smokeless tobacco: Not on file  . Alcohol Use: No   OB History   Grav Para Term Preterm Abortions TAB SAB Ect Mult Living                 Review of Systems  Constitutional: Negative for fever.  Respiratory: Negative.   Cardiovascular: Negative.   Gastrointestinal: Positive for abdominal pain.      Allergies  Review of patient's allergies indicates no known allergies.  Home Medications   Current Outpatient Rx  Name  Route  Sig  Dispense  Refill  . calcium carbonate 1250 MG capsule   Oral   Take 1,250 mg by mouth 2 (two) times daily with a meal.         . diltiazem (CARDIZEM CD) 360 MG 24 hr capsule   Oral   Take 1 capsule (360 mg total) by mouth every morning.   90 capsule   3   . omeprazole (PRILOSEC) 10 MG capsule   Oral   Take 10 mg by mouth daily.         Marland Kitchen telmisartan-hydrochlorothiazide  (MICARDIS HCT) 80-12.5 MG per tablet   Oral   Take 1 tablet by mouth daily.   90 tablet   3    BP 128/76  Pulse 76  Temp(Src) 98 F (36.7 C) (Oral)  Resp 18  Ht 5\' 5"  (1.651 m)  Wt 160 lb (72.576 kg)  BMI 26.63 kg/m2  SpO2 100% Physical Exam  Nursing note and vitals reviewed. Constitutional: She is oriented to person, place, and time. She appears well-developed and well-nourished.  Cardiovascular: Normal rate and regular rhythm.   Pulmonary/Chest: Effort normal and breath sounds normal.  Abdominal: Soft. Bowel sounds are normal.  Suprapubic tenderness  Musculoskeletal: Normal range of motion.  Neurological: She is alert and oriented to person, place, and time.    ED Course  Procedures (including critical care time) Labs Review Labs Reviewed  URINALYSIS, ROUTINE W REFLEX MICROSCOPIC - Abnormal; Notable for the following:    APPearance CLOUDY (*)    Hgb urine dipstick LARGE (*)    Leukocytes, UA MODERATE (*)    All other components within normal limits  URINE MICROSCOPIC-ADD ON - Abnormal; Notable for the following:    Squamous Epithelial / LPF FEW (*)    Bacteria, UA FEW (*)    All other components within normal limits  URINE CULTURE   Imaging Review No results  found.  EKG Interpretation   None       MDM   Final diagnoses:  UTI (lower urinary tract infection)    Will treat with bactrim and pyridium. For uncomplicated uti    Teressa LowerVrinda Joan Herschberger, NP 06/11/13 2059

## 2013-06-11 NOTE — ED Notes (Signed)
Refuses to put on gown  States she has a UTI and there is no reason to put a gown on

## 2013-06-11 NOTE — ED Notes (Signed)
Pt reports difficulty voiding, when she does void it is in small amounts, denies odor to urine

## 2013-06-13 LAB — URINE CULTURE: Colony Count: 2000

## 2013-09-09 ENCOUNTER — Other Ambulatory Visit: Payer: BC Managed Care – PPO

## 2013-09-13 ENCOUNTER — Emergency Department (HOSPITAL_COMMUNITY)
Admission: EM | Admit: 2013-09-13 | Discharge: 2013-09-13 | Disposition: A | Discharge status: Home / Self Care | Payer: BC Managed Care – PPO | Attending: Emergency Medicine | Admitting: Emergency Medicine

## 2013-09-13 ENCOUNTER — Encounter (HOSPITAL_COMMUNITY): Payer: Self-pay | Admitting: Emergency Medicine

## 2013-09-13 DIAGNOSIS — H9190 Unspecified hearing loss, unspecified ear: Secondary | ICD-10-CM | POA: Insufficient documentation

## 2013-09-13 DIAGNOSIS — Z79899 Other long term (current) drug therapy: Secondary | ICD-10-CM | POA: Insufficient documentation

## 2013-09-13 DIAGNOSIS — IMO0002 Reserved for concepts with insufficient information to code with codable children: Secondary | ICD-10-CM | POA: Insufficient documentation

## 2013-09-13 DIAGNOSIS — I10 Essential (primary) hypertension: Secondary | ICD-10-CM | POA: Insufficient documentation

## 2013-09-13 DIAGNOSIS — H6590 Unspecified nonsuppurative otitis media, unspecified ear: Secondary | ICD-10-CM | POA: Insufficient documentation

## 2013-09-13 LAB — RAPID STREP SCREEN (MED CTR MEBANE ONLY): STREPTOCOCCUS, GROUP A SCREEN (DIRECT): NEGATIVE

## 2013-09-13 MED ORDER — OXYCODONE HCL 5 MG PO TABS
5.0000 mg | ORAL_TABLET | Freq: Once | ORAL | Status: AC
  Administered 2013-09-13: 5 mg via ORAL
  Filled 2013-09-13: qty 1

## 2013-09-13 MED ORDER — AMOXICILLIN-POT CLAVULANATE 875-125 MG PO TABS
1.0000 | ORAL_TABLET | Freq: Once | ORAL | Status: AC
  Administered 2013-09-13: 1 via ORAL
  Filled 2013-09-13: qty 1

## 2013-09-13 MED ORDER — PREDNISONE 20 MG PO TABS: 40.0000 mg | ORAL_TABLET | Freq: Every day | ORAL | Status: DC

## 2013-09-13 MED ORDER — OXYCODONE HCL 5 MG PO TABS: 5.0000 mg | ORAL_TABLET | Freq: Four times a day (QID) | ORAL | Status: DC | PRN

## 2013-09-13 MED ORDER — PREDNISONE 20 MG PO TABS
60.0000 mg | ORAL_TABLET | Freq: Once | ORAL | Status: AC
  Administered 2013-09-13: 60 mg via ORAL
  Filled 2013-09-13: qty 3

## 2013-09-13 MED ORDER — AMOXICILLIN-POT CLAVULANATE 875-125 MG PO TABS: 1.0000 | ORAL_TABLET | Freq: Two times a day (BID) | ORAL | Status: DC

## 2013-09-13 NOTE — ED Provider Notes (Signed)
CSN: [id number]     Arrival date & time 09/13/13  2052 History   None    This chart was scribed for non-physician practitioner, Earley Favor, FNP working with Dagmar Hait, MD by Arlan Organ, ED Scribe. This patient was seen in room TR09C/TR09C and the patient's care was started at 9:23 PM.   Chief Complaint  Patient presents with  . Otalgia   The history is provided by the patient. No language interpreter was used.    HPI Comments: Caroline Keller is a 53 y.o. female with a PMHx of Hepatitis C and HTN who presents to the Emergency Department complaining of constant, severe L sided otalgia onset 4 days that is progressively worsening. Pt states she is unable to hear out of her L ear at this time. She states she had an ear infection at the beginning of the year and was treated with antibiotics. However, she states she feels the fluid never completely resolved from her ear. At this time she denies any fever or chills. She admits to known allergies to Vicodin but is able to tolerate small amounts of Percocet. She has no other pertinent past medical history. No other concerns this visit.   Past Medical History  Diagnosis Date  . Hepatitis C   . Hypertension    Past Surgical History  Procedure Laterality Date  . Cholecystectomy    . Cesarean section     No family history on file. History  Substance Use Topics  . Smoking status: Never Smoker   . Smokeless tobacco: Not on file  . Alcohol Use: No   OB History   Grav Para Term Preterm Abortions TAB SAB Ect Mult Living                 Review of Systems  Constitutional: Negative for fever and chills.  HENT: Positive for ear pain (L ear) and hearing loss Huel Cote). Negative for congestion, dental problem, ear discharge, facial swelling, sore throat and trouble swallowing.   Skin: Negative for rash.  Psychiatric/Behavioral: Negative for confusion.      Allergies  Review of patient's allergies indicates no known allergies.  Home  Medications   Prior to Admission medications   Medication Sig Start Date End Date Taking? Authorizing Provider  amoxicillin-clavulanate (AUGMENTIN) 875-125 MG per tablet Take 1 tablet by mouth 2 (two) times daily. 09/13/13   Arman Filter, NP  calcium carbonate 1250 MG capsule Take 1,250 mg by mouth 2 (two) times daily with a meal.    Historical Provider, MD  diltiazem (CARDIZEM CD) 360 MG 24 hr capsule Take 1 capsule (360 mg total) by mouth every morning. 01/29/13   Quintella Reichert, MD  omeprazole (PRILOSEC) 10 MG capsule Take 10 mg by mouth daily.    Historical Provider, MD  oxyCODONE (OXY IR/ROXICODONE) 5 MG immediate release tablet Take 1 tablet (5 mg total) by mouth every 6 (six) hours as needed for severe pain. 09/13/13   Arman Filter, NP  phenazopyridine (PYRIDIUM) 100 MG tablet Take 1 tablet (100 mg total) by mouth 3 (three) times daily. 06/11/13   Teressa Lower, NP  predniSONE (DELTASONE) 20 MG tablet Take 2 tablets (40 mg total) by mouth daily with breakfast. 09/13/13   Arman Filter, NP  sulfamethoxazole-trimethoprim (SEPTRA DS) 800-160 MG per tablet Take 1 tablet by mouth every 12 (twelve) hours. 06/11/13   Teressa Lower, NP  telmisartan-hydrochlorothiazide (MICARDIS HCT) 80-12.5 MG per tablet Take 1 tablet by mouth daily. 01/29/13  Quintella Reichertraci R Turner, MD   Triage Vitals: BP 135/77  Pulse 100  Temp(Src) 98.7 F (37.1 C) (Oral)  Resp 18  SpO2 97%   Physical Exam  Nursing note and vitals reviewed. Constitutional: She is oriented to person, place, and time. She appears well-developed and well-nourished.  HENT:  Head: Normocephalic.  Left Ear: No lacerations. There is tenderness. No drainage or swelling. No mastoid tenderness. Tympanic membrane is bulging. A middle ear effusion is present. Decreased hearing is noted.  Eyes: EOM are normal.  Neck: Normal range of motion.  Pulmonary/Chest: Effort normal.  Abdominal: She exhibits no distension.  Musculoskeletal: Normal range of  motion.  Lymphadenopathy:    She has no cervical adenopathy.  Neurological: She is alert and oriented to person, place, and time.  Psychiatric: She has a normal mood and affect.    ED Course  Procedures (including critical care time)  DIAGNOSTIC STUDIES: Oxygen Saturation is 97% on RA, Normal by my interpretation.    COORDINATION OF CARE: 9:48 PM-Discussed treatment plan with pt at bedside and pt agreed to plan.     Labs Review Labs Reviewed  RAPID STREP SCREEN  CULTURE, GROUP A STREP    Imaging Review No results found.   EKG Interpretation None      MDM  Will start patient on prednisone, Augmentin, and Percocet for pain control.  I will refer her to Dr. Jearld FentonByers, ENT to be seen in the next one to 2 days Final diagnoses:  Serous otitis media  Hearing loss       I personally performed the services described in this documentation, which was scribed in my presence. The recorded information has been reviewed and is accurate.    Arman FilterGail K Kaelin Holford, NP 09/13/13 [phone]

## 2013-09-13 NOTE — ED Notes (Signed)
Pt c/o left ear pain onset today approx 5pm.  St's can't hear out of her left ear, also c/o sore throat

## 2013-09-15 LAB — CULTURE, GROUP A STREP

## 2013-09-15 NOTE — ED Provider Notes (Signed)
Medical screening examination/treatment/procedure(s) were performed by non-physician practitioner and as supervising physician I was immediately available for consultation/collaboration.   EKG Interpretation None        William Panayiotis Rainville, MD 09/15/13 0724 

## 2013-09-17 ENCOUNTER — Other Ambulatory Visit (INDEPENDENT_AMBULATORY_CARE_PROVIDER_SITE_OTHER): Payer: BC Managed Care – PPO

## 2013-09-17 DIAGNOSIS — B182 Chronic viral hepatitis C: Secondary | ICD-10-CM

## 2013-09-18 LAB — HCV RNA QUANT RFLX ULTRA OR GENOTYP
HCV Quantitative Log: 6.3 {Log} — ABNORMAL HIGH (ref <1.18)
HCV Quantitative: 1983024 IU/mL — ABNORMAL HIGH (ref <15)

## 2013-09-22 LAB — HEPATITIS C GENOTYPE

## 2013-11-02 ENCOUNTER — Encounter: Payer: Self-pay | Admitting: Internal Medicine

## 2013-11-02 ENCOUNTER — Ambulatory Visit (INDEPENDENT_AMBULATORY_CARE_PROVIDER_SITE_OTHER): Payer: BC Managed Care – PPO | Admitting: Internal Medicine

## 2013-11-02 VITALS — BP 103/68 | HR 71 | Temp 98.2°F | Wt 170.0 lb

## 2013-11-02 DIAGNOSIS — K219 Gastro-esophageal reflux disease without esophagitis: Secondary | ICD-10-CM | POA: Insufficient documentation

## 2013-11-02 DIAGNOSIS — B182 Chronic viral hepatitis C: Secondary | ICD-10-CM

## 2013-11-02 LAB — COMPLETE METABOLIC PANEL WITH GFR
ALT: 23 U/L (ref 0–35)
AST: 25 U/L (ref 0–37)
Albumin: 4.1 g/dL (ref 3.5–5.2)
Alkaline Phosphatase: 64 U/L (ref 39–117)
BUN: 20 mg/dL (ref 6–23)
CO2: 26 meq/L (ref 19–32)
Calcium: 9.2 mg/dL (ref 8.4–10.5)
Chloride: 102 mEq/L (ref 96–112)
Creat: 0.83 mg/dL (ref 0.50–1.10)
GFR, Est African American: >89 mL/min
GFR, Est Non African American: 80 mL/min
Glucose, Bld: 85 mg/dL (ref 70–99)
Potassium: 4.7 mEq/L (ref 3.5–5.3)
SODIUM: 136 meq/L (ref 135–145)
TOTAL PROTEIN: 7 g/dL (ref 6.0–8.3)
Total Bilirubin: 0.5 mg/dL (ref 0.2–1.2)

## 2013-11-02 LAB — HEPATITIS B SURFACE ANTIBODY,QUALITATIVE: Hep B S Ab: NEGATIVE

## 2013-11-02 LAB — HEPATITIS B SURFACE ANTIGEN: HEP B S AG: NEGATIVE

## 2013-11-02 LAB — HEPATITIS A ANTIBODY, TOTAL: HEP A TOTAL AB: REACTIVE — AB

## 2013-11-02 LAB — HIV ANTIBODY (ROUTINE TESTING W REFLEX): HIV: NONREACTIVE

## 2013-11-02 NOTE — Progress Notes (Signed)
Patient ID: Caroline Keller, female   DOB: 1960-12-22, 53 y.o.   MRN: [id number] +Caroline Keller is a 53 y.o. female who presents for evaluation and management of chronic hepatitis C genotype 1a with VL of 4,098,119 in 2015 . She was originally diagnosed when she was 53yo when she found out that her husband was also HCV+ (he was treated with interferon but now recurring, and looking for 2nd round of treatment). Hepatitis C risk factors present are: partner who is HCV +, and remote hx of IVDU as a young adult. Patient denies any current IVUD. Patient has had liver biopsy in march 2013 which showed mild activity, grade 2, fibrosis stage 0 at that time. Results: abd ultrasound in 2013. Patient has not had prior treatment for Hepatitis C. Patient does not have a past history of liver disease.no EV, no pHTN, no SBP, no episodes of jaundiced. Patient does not have a family history of liver disease. It is unclear if she had immunization to hep a or b since she does not have documented immunity to Hepatitis A or Hepatitis B.  Does not recall being immunized.  Review of Systems Constitutional: positive for fatigue. Negative for fever, chills, diaphoresis, activity change, appetite change, fatigue and unexpected weight change.  HENT: Negative for congestion, sore throat, rhinorrhea, sneezing, trouble swallowing and sinus pressure.  Eyes: Negative for photophobia and visual disturbance.  Respiratory: Negative for cough, chest tightness, shortness of breath, wheezing and stridor.  Cardiovascular: Negative for chest pain, palpitations and leg swelling.  Gastrointestinal: Negative for nausea, vomiting, abdominal pain, diarrhea, constipation, blood in stool, abdominal distention and anal bleeding.  Genitourinary: Negative for dysuria, hematuria, flank pain and difficulty urinating.  Musculoskeletal: Negative for myalgias, back pain, joint swelling, arthralgias and gait problem.  Skin: Negative for color change, pallor, rash and  wound.  Neurological: Negative for dizziness, tremors, weakness and light-headedness.  Hematological: Negative for adenopathy. Does not bruise/bleed easily.  Psychiatric/Behavioral: Negative for behavioral problems, confusion, sleep disturbance, dysphoric mood, decreased concentration and agitation.     Past Medical History  Diagnosis Date  . Hepatitis C   . Hypertension   - GERD - s/p cholecystectomy - s/p csection x 2 - palpitations - degenerative disc disease  No Known Allergies   Current Outpatient Prescriptions on File Prior to Visit  Medication Sig Dispense Refill  . calcium carbonate 1250 MG capsule Take 1,250 mg by mouth 2 (two) times daily with a meal.      . diltiazem (CARDIZEM CD) 360 MG 24 hr capsule Take 1 capsule (360 mg total) by mouth every morning.  90 capsule  3  . omeprazole (PRILOSEC) 10 MG capsule Take 10 mg by mouth daily.      Marland Kitchen telmisartan-hydrochlorothiazide (MICARDIS HCT) 80-12.5 MG per tablet Take 1 tablet by mouth daily.  90 tablet  3  . amoxicillin-clavulanate (AUGMENTIN) 875-125 MG per tablet Take 1 tablet by mouth 2 (two) times daily.  28 tablet  0  . oxyCODONE (OXY IR/ROXICODONE) 5 MG immediate release tablet Take 1 tablet (5 mg total) by mouth every 6 (six) hours as needed for severe pain.  18 tablet  0  . phenazopyridine (PYRIDIUM) 100 MG tablet Take 1 tablet (100 mg total) by mouth 3 (three) times daily.  6 tablet  0  . predniSONE (DELTASONE) 20 MG tablet Take 2 tablets (40 mg total) by mouth daily with breakfast.  12 tablet  0  . sulfamethoxazole-trimethoprim (SEPTRA DS) 800-160 MG per tablet Take 1 tablet by  mouth every 12 (twelve) hours.  10 tablet  0   No current facility-administered medications on file prior to visit.    History  Substance Use Topics  . Smoking status: Never Smoker   . Smokeless tobacco: Not on file  . Alcohol Use: No  - works in [employer], 30hr/wk.  3529 and 55 yo sons. married  Family hx: lung cancer - mother,  breast cancer - sister, cad- father   Objective:   Filed Vitals:   11/02/13 0905  BP: 103/68  Pulse: 71  Temp: 98.2 F (36.8 C)   Physical Exam  Constitutional:  oriented to person, place, and time. appears well-developed and well-nourished. No distress.  HENT:  Mouth/Throat: Oropharynx is clear and moist. No oropharyngeal exudate.  Cardiovascular: Normal rate, regular rhythm and normal heart sounds. Exam reveals no gallop and no friction rub.  No murmur heard.  Pulmonary/Chest: Effort normal and breath sounds normal. No respiratory distress.  has no wheezes.  Abdominal: Soft. Bowel sounds are normal.  exhibits no distension. There is no tenderness. No HSM. No stigmata for cirrhosis Lymphadenopathy: no cervical adenopathy.  Neurological: alert and oriented to person, place, and time. No asterisix Skin: Skin is warm and dry. No rash noted. No erythema.  Psychiatric: a normal mood and affect.  behavior is normal.    Laboratory Genotype:  Lab Results  Component Value Date   HCVGENOTYPE 1a 09/17/2013   HCV viral load: 0,865,7841,983,024 CBC    Component Value Date/Time   WBC 4.1 07/05/2011 0856   RBC 4.20 07/05/2011 0856   HGB 12.9 07/05/2011 0856   HCT 37.5 07/05/2011 0856   PLT 197 07/05/2011 0856   MCV 89.3 07/05/2011 0856   MCH 30.7 07/05/2011 0856   MCHC 34.4 07/05/2011 0856   RDW 12.6 07/05/2011 0856   LYMPHSABS 1.4 04/18/2011 1505   MONOABS 0.4 04/18/2011 1505   EOSABS 0.0 04/18/2011 1505   BASOSABS 0.0 04/18/2011 1505   BMET    Component Value Date/Time   NA 136 11/02/2013 0956   K 4.7 11/02/2013 0956   CL 102 11/02/2013 0956   CO2 26 11/02/2013 0956   GLUCOSE 85 11/02/2013 0956   BUN 20 11/02/2013 0956   CREATININE 0.83 11/02/2013 0956   CREATININE 0.89 04/18/2011 1505   CALCIUM 9.2 11/02/2013 0956   GFRNONAA 80 11/02/2013 0956   GFRNONAA 73* 04/18/2011 1505   GFRAA >89 11/02/2013 0956   GFRAA 85* 04/18/2011 1505    Lab Results  Component Value Date   HEPBSAB NEG 11/02/2013   Lab  Results  Component Value Date   HEPBSAG NEGATIVE 11/02/2013  Hepatitis A total ab POSITIVIE 11/02/2013   Radiology No recent ultrasound  Assessment: Chronic hepatitis genotype 1, in 2013 had fibrosis stage 0 but unclear if she has had progression of disease. Evaluation for initiation of Hepatitis C treatment  Plan:  1) Patient counseled extensively on limiting acetaminophen to no more than 2 grams daily, avoidance of alcohol. 2) Transmission discussed with patient including sexual transmission, sharing razors and toothbrush.   3) Will need referral to gastroenterology: no 4) Will need referral for substance abuse counseling: no 5) based upon results of ultrasound-elastography to decide if she has fibrosis 1 or more, then will prescribe Harvoni for 12 weeks 6) Follow up 2 weeks after starting medication

## 2013-11-15 ENCOUNTER — Other Ambulatory Visit: Payer: Self-pay | Admitting: Internal Medicine

## 2013-11-15 DIAGNOSIS — B182 Chronic viral hepatitis C: Secondary | ICD-10-CM

## 2013-11-16 ENCOUNTER — Telehealth: Payer: Self-pay | Admitting: *Deleted

## 2013-11-16 NOTE — Telephone Encounter (Signed)
Per Dr Drue SecondSnider called and scheduled the patient an appt for Liver Scan. Was given 11/24/13 at 9 am. Called the patient with this information and she advised she works 2 jobs and that is not a good time for her. Gave the patient the number to radiology so that she can schedule for a better time for her.

## 2013-11-24 ENCOUNTER — Ambulatory Visit (HOSPITAL_COMMUNITY)
Admission: RE | Admit: 2013-11-24 | Discharge: 2013-11-24 | Disposition: A | Discharge status: Home / Self Care | Payer: BC Managed Care – PPO | Source: Ambulatory Visit | Attending: Internal Medicine | Admitting: Internal Medicine

## 2013-11-24 ENCOUNTER — Encounter (INDEPENDENT_AMBULATORY_CARE_PROVIDER_SITE_OTHER): Payer: Self-pay

## 2013-11-24 DIAGNOSIS — B182 Chronic viral hepatitis C: Secondary | ICD-10-CM | POA: Insufficient documentation

## 2013-12-08 ENCOUNTER — Ambulatory Visit (INDEPENDENT_AMBULATORY_CARE_PROVIDER_SITE_OTHER): Payer: BC Managed Care – PPO | Admitting: Internal Medicine

## 2013-12-08 VITALS — Wt 167.0 lb

## 2013-12-08 DIAGNOSIS — B1920 Unspecified viral hepatitis C without hepatic coma: Secondary | ICD-10-CM

## 2013-12-08 DIAGNOSIS — Z23 Encounter for immunization: Secondary | ICD-10-CM | POA: Diagnosis not present

## 2013-12-08 NOTE — Progress Notes (Signed)
   Subjective:    Patient ID: Caroline Keller, female    DOB: 10-13-1960, 53 y.o.   MRN: [id number]  HPI Chronic hepatitis genotype 1, in 2013 had fibrosis stage 0 but unclear if she has had progression of disease. Evaluation for initiation of Hepatitis C treatment. Immune for hep A but non immune for hep B. She had elastography which showed F0 staging. She received a letter from [employer] stating that her imaging would not be covered. Wondering if we could provide letter of support on her behalf  She noticed nasal congestion over the last few days concern for ear infection, although does not have any ear pain or fullness sensation at this time.  Current Outpatient Prescriptions on File Prior to Visit  Medication Sig Dispense Refill  . calcium carbonate 1250 MG capsule Take 1,250 mg by mouth 2 (two) times daily with a meal.      . diltiazem (CARDIZEM CD) 360 MG 24 hr capsule Take 1 capsule (360 mg total) by mouth every morning.  90 capsule  3  . omeprazole (PRILOSEC) 10 MG capsule Take 10 mg by mouth daily.      Marland Kitchen telmisartan-hydrochlorothiazide (MICARDIS HCT) 80-12.5 MG per tablet Take 1 tablet by mouth daily.  90 tablet  3  . amoxicillin-clavulanate (AUGMENTIN) 875-125 MG per tablet Take 1 tablet by mouth 2 (two) times daily.  28 tablet  0  . oxyCODONE (OXY IR/ROXICODONE) 5 MG immediate release tablet Take 1 tablet (5 mg total) by mouth every 6 (six) hours as needed for severe pain.  18 tablet  0  . phenazopyridine (PYRIDIUM) 100 MG tablet Take 1 tablet (100 mg total) by mouth 3 (three) times daily.  6 tablet  0  . predniSONE (DELTASONE) 20 MG tablet Take 2 tablets (40 mg total) by mouth daily with breakfast.  12 tablet  0  . sulfamethoxazole-trimethoprim (SEPTRA DS) 800-160 MG per tablet Take 1 tablet by mouth every 12 (twelve) hours.  10 tablet  0   No current facility-administered medications on file prior to visit.   Active Ambulatory Problems    Diagnosis Date Noted  . GERD  (gastroesophageal reflux disease)   . Hepatitis C 12/08/2013   Resolved Ambulatory Problems    Diagnosis Date Noted  . No Resolved Ambulatory Problems   Past Medical History  Diagnosis Date  . Hypertension   . History of cholecystectomy   . History of C-section   . Arthritis of low back          Review of Systems 10 point ros is negative except what is mentioned in hpi    Objective:   Physical Exam Wt 167 lb (75.751 kg) Physical Exam  Constitutional:  oriented to person, place, and time. appears well-developed and well-nourished. No distress.  HENT: TM clear Mouth/Throat: Oropharynx is clear and moist. No oropharyngeal exudate.        Assessment & Plan:  Chronic hep C without hepatic coma = currently her fibrosis score is F0, unlikely to get approval from insurance to support treatment at this time. Appears stable. Will have her come back in 1 year for follow up. will give vaccination for hepatitis b series, first inj today, then will schedule the remaining shots. Also recommend to give pneumococcal and flu vaccination at next visit with pcp. Deferred vaccine for flu today due to uri symptoms  Uri = recommend to use mucinex and supportive care  Health maintenance = will give hep b series

## 2014-01-07 ENCOUNTER — Ambulatory Visit (INDEPENDENT_AMBULATORY_CARE_PROVIDER_SITE_OTHER): Payer: BC Managed Care – PPO | Admitting: Licensed Clinical Social Worker

## 2014-01-07 DIAGNOSIS — Z23 Encounter for immunization: Secondary | ICD-10-CM

## 2014-02-10 ENCOUNTER — Other Ambulatory Visit: Payer: Self-pay | Admitting: *Deleted

## 2014-02-10 MED ORDER — TELMISARTAN-HCTZ 80-12.5 MG PO TABS: 1.0000 | ORAL_TABLET | Freq: Every day | ORAL | Status: DC

## 2014-02-10 MED ORDER — DILTIAZEM HCL ER COATED BEADS 360 MG PO CP24: 360.0000 mg | ORAL_CAPSULE | ORAL | Status: DC

## 2014-02-25 ENCOUNTER — Ambulatory Visit: Payer: BC Managed Care – PPO | Admitting: Cardiology

## 2014-03-05 ENCOUNTER — Emergency Department (HOSPITAL_COMMUNITY)
Admission: EM | Admit: 2014-03-05 | Discharge: 2014-03-05 | Disposition: A | Discharge status: Home / Self Care | Payer: BC Managed Care – PPO | Attending: Emergency Medicine | Admitting: Emergency Medicine

## 2014-03-05 ENCOUNTER — Encounter (HOSPITAL_COMMUNITY): Payer: Self-pay | Admitting: *Deleted

## 2014-03-05 DIAGNOSIS — R350 Frequency of micturition: Secondary | ICD-10-CM | POA: Insufficient documentation

## 2014-03-05 DIAGNOSIS — I10 Essential (primary) hypertension: Secondary | ICD-10-CM | POA: Diagnosis not present

## 2014-03-05 DIAGNOSIS — K219 Gastro-esophageal reflux disease without esophagitis: Secondary | ICD-10-CM | POA: Diagnosis not present

## 2014-03-05 DIAGNOSIS — R5383 Other fatigue: Secondary | ICD-10-CM | POA: Diagnosis not present

## 2014-03-05 DIAGNOSIS — Z87440 Personal history of urinary (tract) infections: Secondary | ICD-10-CM | POA: Diagnosis not present

## 2014-03-05 DIAGNOSIS — E860 Dehydration: Secondary | ICD-10-CM | POA: Insufficient documentation

## 2014-03-05 DIAGNOSIS — N179 Acute kidney failure, unspecified: Secondary | ICD-10-CM | POA: Diagnosis not present

## 2014-03-05 DIAGNOSIS — Z8739 Personal history of other diseases of the musculoskeletal system and connective tissue: Secondary | ICD-10-CM | POA: Insufficient documentation

## 2014-03-05 DIAGNOSIS — Z7952 Long term (current) use of systemic steroids: Secondary | ICD-10-CM | POA: Insufficient documentation

## 2014-03-05 DIAGNOSIS — Z79899 Other long term (current) drug therapy: Secondary | ICD-10-CM | POA: Insufficient documentation

## 2014-03-05 DIAGNOSIS — R3915 Urgency of urination: Secondary | ICD-10-CM | POA: Insufficient documentation

## 2014-03-05 DIAGNOSIS — R197 Diarrhea, unspecified: Secondary | ICD-10-CM | POA: Insufficient documentation

## 2014-03-05 DIAGNOSIS — Z8619 Personal history of other infectious and parasitic diseases: Secondary | ICD-10-CM | POA: Diagnosis not present

## 2014-03-05 LAB — CBC WITH DIFFERENTIAL/PLATELET
BASOS ABS: 0 10*3/uL (ref 0.0–0.1)
BASOS PCT: 0 % (ref 0–1)
Eosinophils Absolute: 0.1 10*3/uL (ref 0.0–0.7)
Eosinophils Relative: 1 % (ref 0–5)
HEMATOCRIT: 36.3 % (ref 36.0–46.0)
Hemoglobin: 12.5 g/dL (ref 12.0–15.0)
LYMPHS PCT: 35 % (ref 12–46)
Lymphs Abs: 1.9 10*3/uL (ref 0.7–4.0)
MCH: 31.6 pg (ref 26.0–34.0)
MCHC: 34.4 g/dL (ref 30.0–36.0)
MCV: 91.9 fL (ref 78.0–100.0)
MONO ABS: 0.3 10*3/uL (ref 0.1–1.0)
Monocytes Relative: 6 % (ref 3–12)
NEUTROS ABS: 3.1 10*3/uL (ref 1.7–7.7)
NEUTROS PCT: 58 % (ref 43–77)
PLATELETS: 267 10*3/uL (ref 150–400)
RBC: 3.95 MIL/uL (ref 3.87–5.11)
RDW: 12.5 % (ref 11.5–15.5)
WBC: 5.3 10*3/uL (ref 4.0–10.5)

## 2014-03-05 LAB — COMPREHENSIVE METABOLIC PANEL
ALT: 26 U/L (ref 0–35)
ANION GAP: 13 (ref 5–15)
AST: 27 U/L (ref 0–37)
Albumin: 4.2 g/dL (ref 3.5–5.2)
Alkaline Phosphatase: 90 U/L (ref 39–117)
BILIRUBIN TOTAL: 0.4 mg/dL (ref 0.3–1.2)
BUN: 33 mg/dL — AB (ref 6–23)
CO2: 22 meq/L (ref 19–32)
CREATININE: 1.25 mg/dL — AB (ref 0.50–1.10)
Calcium: 9.6 mg/dL (ref 8.4–10.5)
Chloride: 97 mEq/L (ref 96–112)
GFR, EST AFRICAN AMERICAN: 55 mL/min — AB (ref >90)
GFR, EST NON AFRICAN AMERICAN: 48 mL/min — AB (ref >90)
Glucose, Bld: 113 mg/dL — ABNORMAL HIGH (ref 70–99)
Potassium: 4.4 mEq/L (ref 3.7–5.3)
Sodium: 132 mEq/L — ABNORMAL LOW (ref 137–147)
Total Protein: 7.9 g/dL (ref 6.0–8.3)

## 2014-03-05 LAB — URINALYSIS, ROUTINE W REFLEX MICROSCOPIC
BILIRUBIN URINE: NEGATIVE
Glucose, UA: NEGATIVE mg/dL
Hgb urine dipstick: NEGATIVE
Ketones, ur: NEGATIVE mg/dL
LEUKOCYTES UA: NEGATIVE
NITRITE: NEGATIVE
Protein, ur: NEGATIVE mg/dL
SPECIFIC GRAVITY, URINE: 1.012 (ref 1.005–1.030)
Urobilinogen, UA: 0.2 mg/dL (ref 0.0–1.0)
pH: 5 (ref 5.0–8.0)

## 2014-03-05 MED ORDER — PHENAZOPYRIDINE HCL 200 MG PO TABS: 200.0000 mg | ORAL_TABLET | Freq: Three times a day (TID) | ORAL | Status: DC

## 2014-03-05 NOTE — ED Provider Notes (Signed)
CSN: [id number]     Arrival date & time 03/05/14  1713 History   First MD Initiated Contact with Patient 03/05/14 1816     Chief Complaint  Patient presents with  . Urinary Tract Infection     (Consider location/radiation/quality/duration/timing/severity/associated sxs/prior Treatment) Patient is a 53 y.o. female presenting with frequency.  Urinary Frequency This is a new problem. The current episode started 1 to 4 weeks ago. The problem occurs constantly. The problem has been waxing and waning. Associated symptoms include fatigue and urinary symptoms. Pertinent negatives include no abdominal pain, chest pain, congestion, coughing, fever, headaches, joint swelling, numbness, rash, vertigo or vomiting. Nothing aggravates the symptoms. She has tried nothing for the symptoms.    Past Medical History  Diagnosis Date  . Hepatitis C   . Hypertension   . GERD (gastroesophageal reflux disease)   . History of cholecystectomy   . History of C-section   . Arthritis of low back    Past Surgical History  Procedure Laterality Date  . Cholecystectomy    . Cesarean section     History reviewed. No pertinent family history. History  Substance Use Topics  . Smoking status: Never Smoker   . Smokeless tobacco: Not on file  . Alcohol Use: No   OB History    No data available     Review of Systems  Constitutional: Positive for fatigue. Negative for fever and activity change.  HENT: Negative for congestion and facial swelling.   Eyes: Negative for discharge and redness.  Respiratory: Negative for cough and shortness of breath.   Cardiovascular: Negative for chest pain and palpitations.  Gastrointestinal: Positive for diarrhea. Negative for vomiting, abdominal pain and abdominal distention.  Endocrine: Negative for polydipsia and polyuria.  Genitourinary: Positive for urgency and frequency. Negative for dysuria and menstrual problem.  Musculoskeletal: Negative for back pain and joint  swelling.  Skin: Negative for color change, rash and wound.  Neurological: Negative for dizziness, vertigo, light-headedness, numbness and headaches.      Allergies  Review of patient's allergies indicates no known allergies.  Home Medications   Prior to Admission medications   Medication Sig Start Date End Date Taking? Authorizing Provider  amoxicillin-clavulanate (AUGMENTIN) 875-125 MG per tablet Take 1 tablet by mouth 2 (two) times daily. 09/13/13   Arman FilterGail K Schulz, NP  calcium carbonate 1250 MG capsule Take 1,250 mg by mouth 2 (two) times daily with a meal.    Historical Provider, MD  diltiazem (CARDIZEM CD) 360 MG 24 hr capsule Take 1 capsule (360 mg total) by mouth every morning. 02/10/14   Quintella Reichertraci R Turner, MD  omeprazole (PRILOSEC) 10 MG capsule Take 10 mg by mouth daily.    Historical Provider, MD  oxyCODONE (OXY IR/ROXICODONE) 5 MG immediate release tablet Take 1 tablet (5 mg total) by mouth every 6 (six) hours as needed for severe pain. 09/13/13   Arman FilterGail K Schulz, NP  phenazopyridine (PYRIDIUM) 100 MG tablet Take 1 tablet (100 mg total) by mouth 3 (three) times daily. 06/11/13   Teressa LowerVrinda Pickering, NP  predniSONE (DELTASONE) 20 MG tablet Take 2 tablets (40 mg total) by mouth daily with breakfast. 09/13/13   Arman FilterGail K Schulz, NP  sulfamethoxazole-trimethoprim (SEPTRA DS) 800-160 MG per tablet Take 1 tablet by mouth every 12 (twelve) hours. 06/11/13   Teressa LowerVrinda Pickering, NP  telmisartan-hydrochlorothiazide (MICARDIS HCT) 80-12.5 MG per tablet Take 1 tablet by mouth daily. 02/10/14   Quintella Reichertraci R Turner, MD   BP 118/79 mmHg  Pulse 87  Temp(Src) 98 F (36.7 C) (Oral)  Resp 18  SpO2 100% Physical Exam  Constitutional: She is oriented to person, place, and time. She appears well-developed and well-nourished.  HENT:  Head: Normocephalic and atraumatic.  Eyes: Conjunctivae and EOM are normal. Right eye exhibits no discharge. Left eye exhibits no discharge.  Cardiovascular: Normal rate and regular  rhythm.   Pulmonary/Chest: Effort normal and breath sounds normal. No respiratory distress.  Abdominal: Soft. She exhibits no distension. There is no tenderness. There is no rebound.  Musculoskeletal: Normal range of motion. She exhibits no edema or tenderness.  Neurological: She is alert and oriented to person, place, and time.  Skin: Skin is warm and dry.  Nursing note and vitals reviewed.   ED Course  Procedures (including critical care time) Labs Review Labs Reviewed  COMPREHENSIVE METABOLIC PANEL - Abnormal; Notable for the following:    Sodium 132 (*)    Glucose, Bld 113 (*)    BUN 33 (*)    Creatinine, Ser 1.25 (*)    GFR calc non Af Amer 48 (*)    GFR calc Af Amer 55 (*)    All other components within normal limits  CBC WITH DIFFERENTIAL  URINALYSIS, ROUTINE W REFLEX MICROSCOPIC    Imaging Review No results found.   EKG Interpretation None      MDM   Final diagnoses:  None    53 yo F w/ recent treatmetn for UTI, still with symptoms. Also with a couple loose stools over last two days. Some suprapubic ttp. UA normal, no infection. Doubt Cdiff without more persistent diarrhea. Doubt prolapse as cause of symptoms. Likely residual from UTI. Will try pyridium and FU w/ PCP.     Marily MemosJason Shawnika Pepin, MD 03/06/14 2113  Enid SkeensJoshua M Zavitz, MD 03/07/14 [phone]

## 2014-03-05 NOTE — ED Notes (Addendum)
Pt reports having UTI x 2 week. Has been to pcp and been on antibiotics, they have been changed once already. Now having diarrhea and fatigue, still has urinary frequency and pain with urination. Pt has hep C and does not want family to know about it.

## 2014-03-23 ENCOUNTER — Encounter: Payer: Self-pay | Admitting: Cardiology

## 2014-03-23 ENCOUNTER — Ambulatory Visit (INDEPENDENT_AMBULATORY_CARE_PROVIDER_SITE_OTHER): Payer: BC Managed Care – PPO | Admitting: Cardiology

## 2014-03-23 VITALS — BP 124/74 | HR 73 | Ht 65.0 in | Wt 167.0 lb

## 2014-03-23 DIAGNOSIS — I10 Essential (primary) hypertension: Secondary | ICD-10-CM

## 2014-03-23 DIAGNOSIS — I493 Ventricular premature depolarization: Secondary | ICD-10-CM

## 2014-03-23 DIAGNOSIS — I491 Atrial premature depolarization: Secondary | ICD-10-CM | POA: Insufficient documentation

## 2014-03-23 NOTE — Patient Instructions (Signed)
Your physician wants you to follow-up in: 1 year with Dr. Turner. You will receive a reminder letter in the mail two months in advance. If you don't receive a letter, please call our office to schedule the follow-up appointment.  

## 2014-03-23 NOTE — Progress Notes (Signed)
  [address] [place], [place]  [address] Phone: [phone] Fax:  [phone]  Date:  03/23/2014   ID:  Caroline RifeJana Keller, DOB 1960/07/21, MRN [id number]  PCP:  Gaye AlkenBARNES,ELIZABETH STEWART, MD  Cardiologist:  Armanda Magicraci Jhett Fretwell, MD    History of Present Illness: This is a 53yo female with a history of HTN, hep C and GERD who presents today for followup of her.  She is doing well.  She denies any chest pain, SOB, DOE, LE edema, dizziness or syncope.  Occasionally she will have a skipped heart beat but she says they do not bother her.  Wt Readings from Last 3 Encounters:  03/23/14 167 lb (75.751 kg)  12/08/13 167 lb (75.751 kg)  11/02/13 170 lb (77.111 kg)     Past Medical History  Diagnosis Date  . Hepatitis C   . Hypertension   . GERD (gastroesophageal reflux disease)   . History of cholecystectomy   . History of C-section   . Arthritis of low back     Current Outpatient Prescriptions  Medication Sig Dispense Refill  . calcium carbonate 1250 MG capsule Take 1,250 mg by mouth 2 (two) times daily with a meal.    . diltiazem (CARDIZEM CD) 360 MG 24 hr capsule Take 1 capsule (360 mg total) by mouth every morning. 90 capsule 0  . omeprazole (PRILOSEC) 10 MG capsule Take 10 mg by mouth daily.    Marland Kitchen. telmisartan-hydrochlorothiazide (MICARDIS HCT) 80-12.5 MG per tablet Take 1 tablet by mouth daily. 90 tablet 0  . amoxicillin-clavulanate (AUGMENTIN) 875-125 MG per tablet Take 1 tablet by mouth 2 (two) times daily. (Patient not taking: Reported on 03/23/2014) 28 tablet 0   No current facility-administered medications for this visit.    Allergies:   No Known Allergies  Social History:  The patient  reports that she has never smoked. She does not have any smokeless tobacco history on file. She reports that she does not drink alcohol or use illicit drugs.   Family History:  The patient's family history is not on file.   ROS:  Please see the history of present illness.      All other  systems reviewed and negative.   PHYSICAL EXAM: VS:  BP 124/74 mmHg  Pulse 73  Ht 5\' 5"  (1.651 m)  Wt 167 lb (75.751 kg)  BMI 27.79 kg/m2 Well nourished, well developed, in no acute distress HEENT: normal Neck: no JVD Cardiac:  normal S1, S2; RRR; no murmur Lungs:  clear to auscultation bilaterally, no wheezing, rhonchi or rales Abd: soft, nontender, no hepatomegaly Ext: no edema Skin: warm and dry Neuro:  CNs 2-12 intact, no focal abnormalities noted  EKG:  NSR with no ST changes     ASSESSMENT AND PLAN:  1. HTN well controlled on Cardizem and Micardis 2. GERD 3. PAC's and PVC's - controlled on Cardizem  Followup with me in 1 year  Signed, Armanda Magicraci Shams Fill, MD [facility] 03/23/2014 3:30 PM

## 2014-04-15 ENCOUNTER — Other Ambulatory Visit: Payer: Self-pay | Admitting: Obstetrics and Gynecology

## 2014-04-19 LAB — CYTOLOGY - PAP

## 2014-05-11 ENCOUNTER — Other Ambulatory Visit: Payer: Self-pay | Admitting: *Deleted

## 2014-05-11 MED ORDER — DILTIAZEM HCL ER COATED BEADS 360 MG PO CP24: 360.0000 mg | ORAL_CAPSULE | ORAL | Status: DC

## 2014-05-11 MED ORDER — TELMISARTAN-HCTZ 80-12.5 MG PO TABS: 1.0000 | ORAL_TABLET | Freq: Every day | ORAL | Status: DC

## 2014-06-10 ENCOUNTER — Ambulatory Visit (INDEPENDENT_AMBULATORY_CARE_PROVIDER_SITE_OTHER): Payer: BLUE CROSS/BLUE SHIELD | Admitting: *Deleted

## 2014-06-10 DIAGNOSIS — B182 Chronic viral hepatitis C: Secondary | ICD-10-CM | POA: Diagnosis not present

## 2014-06-10 DIAGNOSIS — Z23 Encounter for immunization: Secondary | ICD-10-CM | POA: Diagnosis not present

## 2014-12-29 ENCOUNTER — Encounter: Payer: Self-pay | Admitting: Cardiology

## 2015-01-03 ENCOUNTER — Encounter (HOSPITAL_BASED_OUTPATIENT_CLINIC_OR_DEPARTMENT_OTHER): Payer: Self-pay

## 2015-01-03 ENCOUNTER — Emergency Department (HOSPITAL_BASED_OUTPATIENT_CLINIC_OR_DEPARTMENT_OTHER): Payer: BLUE CROSS/BLUE SHIELD

## 2015-01-03 ENCOUNTER — Observation Stay (HOSPITAL_BASED_OUTPATIENT_CLINIC_OR_DEPARTMENT_OTHER)
Admission: EM | Admit: 2015-01-03 | Discharge: 2015-01-04 | Disposition: A | Discharge status: Home / Self Care | Payer: BLUE CROSS/BLUE SHIELD | Attending: Internal Medicine | Admitting: Internal Medicine

## 2015-01-03 ENCOUNTER — Encounter (HOSPITAL_COMMUNITY): Payer: Self-pay | Admitting: Internal Medicine

## 2015-01-03 ENCOUNTER — Telehealth: Payer: Self-pay | Admitting: Internal Medicine

## 2015-01-03 ENCOUNTER — Encounter: Payer: Self-pay | Admitting: Internal Medicine

## 2015-01-03 DIAGNOSIS — I491 Atrial premature depolarization: Secondary | ICD-10-CM | POA: Diagnosis present

## 2015-01-03 DIAGNOSIS — I493 Ventricular premature depolarization: Secondary | ICD-10-CM | POA: Diagnosis present

## 2015-01-03 DIAGNOSIS — K219 Gastro-esophageal reflux disease without esophagitis: Secondary | ICD-10-CM | POA: Diagnosis not present

## 2015-01-03 DIAGNOSIS — F418 Other specified anxiety disorders: Secondary | ICD-10-CM

## 2015-01-03 DIAGNOSIS — R072 Precordial pain: Secondary | ICD-10-CM | POA: Diagnosis not present

## 2015-01-03 DIAGNOSIS — I10 Essential (primary) hypertension: Secondary | ICD-10-CM | POA: Diagnosis not present

## 2015-01-03 DIAGNOSIS — R079 Chest pain, unspecified: Secondary | ICD-10-CM | POA: Diagnosis present

## 2015-01-03 HISTORY — DX: Personal history of other diseases of the digestive system: Z87.19

## 2015-01-03 HISTORY — DX: Anxiety disorder, unspecified: F41.9

## 2015-01-03 HISTORY — DX: Palpitations: R00.2

## 2015-01-03 LAB — CBC
HCT: 38.1 % (ref 36.0–46.0)
HCT: 35.6 % — ABNORMAL LOW (ref 36.0–46.0)
HEMOGLOBIN: 12.9 g/dL (ref 12.0–15.0)
Hemoglobin: 12.1 g/dL (ref 12.0–15.0)
MCH: 30.9 pg (ref 26.0–34.0)
MCH: 30.6 pg (ref 26.0–34.0)
MCHC: 33.9 g/dL (ref 30.0–36.0)
MCHC: 34 g/dL (ref 30.0–36.0)
MCV: 91.4 fL (ref 78.0–100.0)
MCV: 89.9 fL (ref 78.0–100.0)
PLATELETS: 190 10*3/uL (ref 150–400)
PLATELETS: 171 10*3/uL (ref 150–400)
RBC: 4.17 MIL/uL (ref 3.87–5.11)
RBC: 3.96 MIL/uL (ref 3.87–5.11)
RDW: 12.2 % (ref 11.5–15.5)
RDW: 12.6 % (ref 11.5–15.5)
WBC: 5.5 10*3/uL (ref 4.0–10.5)
WBC: 4.9 10*3/uL (ref 4.0–10.5)

## 2015-01-03 LAB — BASIC METABOLIC PANEL
ANION GAP: 7 (ref 5–15)
BUN: 14 mg/dL (ref 6–20)
CHLORIDE: 105 mmol/L (ref 101–111)
CO2: 27 mmol/L (ref 22–32)
Calcium: 9.5 mg/dL (ref 8.9–10.3)
Creatinine, Ser: 0.87 mg/dL (ref 0.44–1.00)
Glucose, Bld: 145 mg/dL — ABNORMAL HIGH (ref 65–99)
POTASSIUM: 3.9 mmol/L (ref 3.5–5.1)
SODIUM: 139 mmol/L (ref 135–145)

## 2015-01-03 LAB — CREATININE, SERUM
CREATININE: 0.91 mg/dL (ref 0.44–1.00)
GFR calc non Af Amer: >60 mL/min (ref >60)

## 2015-01-03 LAB — D-DIMER, QUANTITATIVE (NOT AT ARMC)

## 2015-01-03 LAB — TROPONIN I: Troponin I: <0.03 ng/mL (ref <0.031)

## 2015-01-03 MED ORDER — TELMISARTAN-HCTZ 80-12.5 MG PO TABS: 1.0000 | ORAL_TABLET | Freq: Every day | ORAL | Status: DC

## 2015-01-03 MED ORDER — DILTIAZEM HCL ER COATED BEADS 180 MG PO CP24
360.0000 mg | ORAL_CAPSULE | ORAL | Status: DC
  Administered 2015-01-04: 360 mg via ORAL
  Filled 2015-01-03: qty 2

## 2015-01-03 MED ORDER — NITROGLYCERIN 0.4 MG SL SUBL: 0.4000 mg | SUBLINGUAL_TABLET | SUBLINGUAL | Status: DC | PRN

## 2015-01-03 MED ORDER — ENOXAPARIN SODIUM 40 MG/0.4ML ~~LOC~~ SOLN
40.0000 mg | SUBCUTANEOUS | Status: DC
Start: 2015-01-03 — End: 2015-01-04
  Administered 2015-01-03: 40 mg via SUBCUTANEOUS
  Filled 2015-01-03: qty 0.4

## 2015-01-03 MED ORDER — ASPIRIN EC 81 MG PO TBEC: 81.0000 mg | DELAYED_RELEASE_TABLET | Freq: Every day | ORAL | Status: DC

## 2015-01-03 MED ORDER — ACETAMINOPHEN 325 MG PO TABS
650.0000 mg | ORAL_TABLET | ORAL | Status: DC | PRN
  Administered 2015-01-04: 650 mg via ORAL

## 2015-01-03 MED ORDER — ASPIRIN 325 MG PO TABS
325.0000 mg | ORAL_TABLET | Freq: Once | ORAL | Status: AC
  Administered 2015-01-03: 325 mg via ORAL
  Filled 2015-01-03: qty 1

## 2015-01-03 MED ORDER — PANTOPRAZOLE SODIUM 40 MG PO TBEC
40.0000 mg | DELAYED_RELEASE_TABLET | Freq: Every day | ORAL | Status: DC
  Administered 2015-01-03 – 2015-01-04 (×2): 40 mg via ORAL
  Filled 2015-01-03 (×2): qty 1

## 2015-01-03 MED ORDER — HYDROCHLOROTHIAZIDE 12.5 MG PO CAPS
12.5000 mg | ORAL_CAPSULE | Freq: Every day | ORAL | Status: DC
  Administered 2015-01-04: 12.5 mg via ORAL
  Filled 2015-01-03: qty 1

## 2015-01-03 MED ORDER — IRBESARTAN 150 MG PO TABS
300.0000 mg | ORAL_TABLET | Freq: Every day | ORAL | Status: DC
  Administered 2015-01-04: 300 mg via ORAL
  Filled 2015-01-03: qty 2

## 2015-01-03 MED ORDER — CALCIUM CARBONATE 1250 (500 CA) MG PO TABS: 1.0000 | ORAL_TABLET | Freq: Two times a day (BID) | ORAL | Status: DC

## 2015-01-03 MED ORDER — LORAZEPAM 1 MG PO TABS
1.0000 mg | ORAL_TABLET | Freq: Once | ORAL | Status: AC
  Administered 2015-01-03: 1 mg via ORAL
  Filled 2015-01-03: qty 1

## 2015-01-03 MED ORDER — ONDANSETRON HCL 4 MG/2ML IJ SOLN: 4.0000 mg | Freq: Four times a day (QID) | INTRAMUSCULAR | Status: DC | PRN

## 2015-01-03 MED ORDER — CALCIUM CARBONATE 1250 (500 CA) MG PO CAPS: 1250.0000 mg | ORAL_CAPSULE | Freq: Two times a day (BID) | ORAL | Status: DC

## 2015-01-03 MED ORDER — ASPIRIN EC 81 MG PO TBEC
81.0000 mg | DELAYED_RELEASE_TABLET | Freq: Every day | ORAL | Status: DC
  Administered 2015-01-04: 81 mg via ORAL
  Filled 2015-01-03: qty 1

## 2015-01-03 NOTE — ED Notes (Signed)
Pt given room assignment. Agrees to admission

## 2015-01-03 NOTE — H&P (Signed)
ADMISSION HISTORY & PHYSICAL   Chief Complaint:  Chest heaviness  Cardiologist: Dr. Radford Pax  Primary Care Physician: Gerrit Heck, MD  HPI:  This is a 54 y.o. female with a past medical history significant for hypertension, hepatitis C, GERD and palpitations in the past. She is also suffering from significant anxiety and previously was on an older aunt had depression to cause some liver function abnormalities therefore she was taken off of it. She is since refused to be back on anti-depressive medicine other recently has been struggling with poor sleep, significant stress and working 2 jobs which is caused her what appears to be physical exhaustion. She reports 2 days of chest heaviness that came on all of a sudden. This is a constant pressure which is not necessarily worse with exertion or relieved by rest. It does not radiate. It was not improved with aspirin or any medications. Workup at [facility] included EKG which showed normal sinus rhythm without ischemic changes. She reports that her palpitations have recurred recently but they feel different somehow. Initial troponin was negative. Blood work was unremarkable. D-dimer was negative and chest x-ray shows no acute disease.   PMHx:  Past Medical History  Diagnosis Date  . Hepatitis C   . Hypertension   . GERD (gastroesophageal reflux disease)   . History of cholecystectomy   . History of C-section   . Arthritis of low back   . Palpitations   . Anxiety     Past Surgical History  Procedure Laterality Date  . Cholecystectomy    . Cesarean section      FAMHx:  Family History  Problem Relation Age of Onset  . Lung cancer Mother   . Breast cancer Sister   . Coronary artery disease Father     SOCHx:   reports that she has never smoked. She does not have any smokeless tobacco history on file. She reports that she does not drink alcohol or use illicit drugs.  ALLERGIES:  No Known  Allergies  ROS: A comprehensive review of systems was negative except for: Constitutional: positive for fatigue Cardiovascular: positive for chest pressure/discomfort Behavioral/Psych: positive for anxiety, sleep disturbance and Depressed mood  HOME MEDS: Medications Prior to Admission  Medication Sig Dispense Refill  . calcium carbonate 1250 MG capsule Take 1,250 mg by mouth 2 (two) times daily with a meal.    . diltiazem (CARDIZEM CD) 360 MG 24 hr capsule Take 1 capsule (360 mg total) by mouth every morning. 90 capsule 3  . omeprazole (PRILOSEC) 10 MG capsule Take 10 mg by mouth daily.    Marland Kitchen telmisartan-hydrochlorothiazide (MICARDIS HCT) 80-12.5 MG per tablet Take 1 tablet by mouth daily. 90 tablet 3    LABS/IMAGING: Results for orders placed or performed during the hospital encounter of 01/03/15 (from the past 48 hour(s))  CBC     Status: None   Collection Time: 01/03/15  2:30 PM  Result Value Ref Range   WBC 5.5 4.0 - 10.5 K/uL   RBC 4.17 3.87 - 5.11 MIL/uL   Hemoglobin 12.9 12.0 - 15.0 g/dL   HCT 38.1 36.0 - 46.0 %   MCV 91.4 78.0 - 100.0 fL   MCH 30.9 26.0 - 34.0 pg   MCHC 33.9 30.0 - 36.0 g/dL   RDW 12.2 11.5 - 15.5 %   Platelets 190 150 - 400 K/uL  Basic metabolic panel     Status: Abnormal   Collection Time: 01/03/15  2:30 PM  Result Value Ref Range   Sodium 139 135 - 145 mmol/L   Potassium 3.9 3.5 - 5.1 mmol/L   Chloride 105 101 - 111 mmol/L   CO2 27 22 - 32 mmol/L   Glucose, Bld 145 (H) 65 - 99 mg/dL   BUN 14 6 - 20 mg/dL   Creatinine, Ser 0.87 0.44 - 1.00 mg/dL   Calcium 9.5 8.9 - 10.3 mg/dL   GFR calc non Af Amer >60 >60 mL/min   GFR calc Af Amer >60 >60 mL/min    Comment: (NOTE) The eGFR has been calculated using the CKD EPI equation. This calculation has not been validated in all clinical situations. eGFR's persistently <60 mL/min signify possible Chronic Kidney Disease.    Anion gap 7 5 - 15  Troponin I     Status: None   Collection Time: 01/03/15   2:30 PM  Result Value Ref Range   Troponin I <0.03 <0.031 ng/mL    Comment:        NO INDICATION OF MYOCARDIAL INJURY.   D-dimer, quantitative (not at [facility])     Status: None   Collection Time: 01/03/15  2:30 PM  Result Value Ref Range   D-Dimer, Quant <0.27 0.00 - 0.48 ug/mL-FEU    Comment:        AT THE INHOUSE ESTABLISHED CUTOFF VALUE OF 0.48 ug/mL FEU, THIS ASSAY HAS BEEN DOCUMENTED IN THE LITERATURE TO HAVE A SENSITIVITY AND NEGATIVE PREDICTIVE VALUE OF AT LEAST 98 TO 99%.  THE TEST RESULT SHOULD BE CORRELATED WITH AN ASSESSMENT OF THE CLINICAL PROBABILITY OF DVT / VTE.    Dg Chest 2 View  01/03/2015   CLINICAL DATA:  Chest pain, heaviness started yesterday while cleaning lb. History of hepatitis-C, hypertension.  EXAM: CHEST  2 VIEW  COMPARISON:  06/16/2006  FINDINGS: The heart size and mediastinal contours are within normal limits. Both lungs are clear. The visualized skeletal structures are unremarkable.  IMPRESSION: No active cardiopulmonary disease.   Electronically Signed   By: Rolm Baptise M.D.   On: 01/03/2015 14:55    VITALS: Filed Vitals:   01/03/15 1858  BP: 119/77  Pulse: 80  Temp: 99.5 F (37.5 C)  Resp: 18    EXAM: General appearance: alert and Tearful Neck: no carotid bruit and no JVD Lungs: clear to auscultation bilaterally Heart: regular rate and rhythm, S1, S2 normal, no murmur, click, rub or gallop Abdomen: soft, non-tender; bowel sounds normal; no masses,  no organomegaly Extremities: extremities normal, atraumatic, no cyanosis or edema Pulses: 2+ and symmetric Skin: Skin color, texture, turgor normal. No rashes or lesions Neurologic: Grossly normal Psych: Mildly anxious, tearful, appears exhausted  IMPRESSION: Principal Problem:   Substernal chest pain Active Problems:   GERD (gastroesophageal reflux disease)   PVC's (premature ventricular contractions)   PAC (premature atrial contraction)   Benign essential HTN   PLAN: 1. Caroline Keller presents with 2 days of constant substernal chest heaviness with an initially negative evaluation for ischemia. EKG shows no acute ischemic changes. She does have a history of PVCs and PACs, however this was not noted in the emergency room although she has felt more significant palpitations recently. She also appears physically exhausted, very anxious and potentially depressed and sad. Think this is likely contributing to her chest discomfort. Plan to follow serial troponins overnight with a stress test in the morning. I would recommend holding her diltiazem in the a.m. to allow for an exercise Myoview. If this is negative, she  may need to follow-up with her primary care Heber Hoog and evaluate for possible treatment of anxiety/depression.  Pixie Casino, MD, [facility] Attending Cardiologist [facility] 01/03/2015, 7:23 PM

## 2015-01-03 NOTE — ED Notes (Signed)
While discussing admission process with pt she verbalized that she thought she was going to the ED to be seen by the cardiologist, not a hospital room. States that she would rather go home and come back to hospital if she felt worse. States she does not want to stay overnight in the hospital. Dr. Fredderick Phenix at bedside to speak to patient

## 2015-01-03 NOTE — ED Notes (Signed)
Patient transported to X-ray 

## 2015-01-03 NOTE — ED Notes (Signed)
Attempt x 1 to call report to [facility]. RN will call back when able to take report

## 2015-01-03 NOTE — ED Notes (Signed)
CP started yesterday while cleaning house

## 2015-01-03 NOTE — Telephone Encounter (Addendum)
Called about Caroline Keller being in [employer] with chest pain. Work-up was negative. ER requesting a follow-up appointment in 1-2 weeks.  Chrystie Nose, MD, [facility] Attending Cardiologist [facility]  ADDENDUM: Called back as patient continues to experience chest pain. ER is now requesting admission for rule-out. I have accepted her on our service at [facility] for rule-out and additional testing if necessary.  Chrystie Nose, MD, [facility] Attending Cardiologist [facility]

## 2015-01-03 NOTE — ED Notes (Signed)
Hess, EDPA at bedside to discuss admission

## 2015-01-03 NOTE — ED Notes (Signed)
Patient transported to x-ray. ?

## 2015-01-03 NOTE — ED Provider Notes (Signed)
CSN: [id number]     Arrival date & time 01/03/15  1409 History   First MD Initiated Contact with Patient 01/03/15 1417     Chief Complaint  Patient presents with  . Chest Pain     (Consider location/radiation/quality/duration/timing/severity/associated sxs/prior Treatment) HPI Comments: 54 year old female complaining of gradual onset substernal chest discomfort 2 days. Discomfort is described as a constant pressure that she notices more when she is not doing anything. After cleaning her house yesterday, she sat down and couldn't really noticed the pressure in her chest, and had a sensation that she needed to take a deep breath which increased the pressure with deep inspiration. Denies shortness of breath. Denies fever, wheezing, cough, nausea, vomiting or diaphoresis. Former smoker over 20 years ago. No personal history of MI or PE. Had an episode of palpitations on her arrival to the ED. No recent long travel, surgery or trauma. Has a history of heart palpitations and sees Dr. Mayford Knife.  Patient is a 54 y.o. female presenting with chest pain. The history is provided by the patient.  Chest Pain Pain location:  Substernal area Pain quality: pressure   Pain radiates to:  Does not radiate Pain radiates to the back: no   Pain severity:  Moderate Onset quality:  Gradual Duration:  2 days Timing:  Constant Progression:  Waxing and waning Chronicity:  New Relieved by:  None tried Ineffective treatments:  None tried Associated symptoms: palpitations     Past Medical History  Diagnosis Date  . Hepatitis C   . Hypertension   . GERD (gastroesophageal reflux disease)   . History of cholecystectomy   . History of C-section   . Arthritis of low back   . Palpitations   . Anxiety    Past Surgical History  Procedure Laterality Date  . Cholecystectomy    . Cesarean section     No family history on file. Social History  Substance Use Topics  . Smoking status: Never Smoker   . Smokeless  tobacco: None  . Alcohol Use: No   OB History    No data available     Review of Systems  Cardiovascular: Positive for chest pain and palpitations.  All other systems reviewed and are negative.     Allergies  Review of patient's allergies indicates no known allergies.  Home Medications   Prior to Admission medications   Medication Sig Start Date End Date Taking? Authorizing Provider  calcium carbonate 1250 MG capsule Take 1,250 mg by mouth 2 (two) times daily with a meal.    Historical Provider, MD  diltiazem (CARDIZEM CD) 360 MG 24 hr capsule Take 1 capsule (360 mg total) by mouth every morning. 05/11/14   Quintella Reichert, MD  omeprazole (PRILOSEC) 10 MG capsule Take 10 mg by mouth daily.    Historical Provider, MD  telmisartan-hydrochlorothiazide (MICARDIS HCT) 80-12.5 MG per tablet Take 1 tablet by mouth daily. 05/11/14   Quintella Reichert, MD   BP 107/63 mmHg  Pulse 83  Temp(Src) 98.1 F (36.7 C) (Oral)  Resp 21  Ht  (1.626 m)  Wt 165 lb (74.844 kg)  BMI 28.31 kg/m2  SpO2 99% Physical Exam  Constitutional: She is oriented to person, place, and time. She appears well-developed and well-nourished. No distress.  HENT:  Head: Normocephalic and atraumatic.  Mouth/Throat: Oropharynx is clear and moist.  Eyes: Conjunctivae and EOM are normal. Pupils are equal, round, and reactive to light.  Neck: Normal range of motion. Neck supple.  No JVD present.  Cardiovascular: Normal rate, regular rhythm, normal heart sounds and intact distal pulses.   No extremity edema.  Pulmonary/Chest: Effort normal and breath sounds normal. No respiratory distress. She exhibits no tenderness.  Abdominal: Soft. Bowel sounds are normal. There is no tenderness.  Musculoskeletal: Normal range of motion. She exhibits no edema.  Neurological: She is alert and oriented to person, place, and time. She has normal strength. No sensory deficit.  Speech fluent, goal oriented. Moves extremities without  ataxia. Equal grip strength bilateral.  Skin: Skin is warm and dry. She is not diaphoretic.  Psychiatric: Her behavior is normal. Her mood appears anxious.  Nursing note and vitals reviewed.   ED Course  Procedures (including critical care time) Labs Review Labs Reviewed  BASIC METABOLIC PANEL - Abnormal; Notable for the following:    Glucose, Bld 145 (*)    All other components within normal limits  CBC  TROPONIN I  D-DIMER, QUANTITATIVE (NOT AT [facility])    Imaging Review Dg Chest 2 View  01/03/2015   CLINICAL DATA:  Chest pain, heaviness started yesterday while cleaning lb. History of hepatitis-C, hypertension.  EXAM: CHEST  2 VIEW  COMPARISON:  06/16/2006  FINDINGS: The heart size and mediastinal contours are within normal limits. Both lungs are clear. The visualized skeletal structures are unremarkable.  IMPRESSION: No active cardiopulmonary disease.   Electronically Signed   By: Charlett Nose M.D.   On: 01/03/2015 14:55   I have personally reviewed and evaluated these images and lab results as part of my medical decision-making.   EKG Interpretation   Date/Time:  Monday January 03 2015 14:17:39 EDT Ventricular Rate:  84 PR Interval:  166 QRS Duration: 92 QT Interval:  356 QTC Calculation: 420 R Axis:   71 Text Interpretation:  Normal sinus rhythm Normal ECG Confirmed by  Rubin Payor  MD, Harrold Donath [phone]) on 01/03/2015 2:20:23 PM      MDM   Final diagnoses:  Substernal chest pain   Non-toxic appearing, NAD. AFVSS. Workup negative. HEART score 3. Doubt PE, D dimer WNL. Pt given ASA and ativan as she is anxious appearing with hx of anxiety. No change in symptoms. I spoke with Dr. Rennis Golden, on call for cardiology, who states the pt can be admitted for observation given continued symptoms. Will admit for obs. Pt stable for transfer.  Discussed with attending Dr. Rubin Payor who agrees with plan of care.  Kathrynn Speed, PA-C 01/03/15 1630  Benjiman Core, MD 01/04/15 [phone]

## 2015-01-04 ENCOUNTER — Observation Stay (HOSPITAL_COMMUNITY): Payer: BLUE CROSS/BLUE SHIELD

## 2015-01-04 ENCOUNTER — Other Ambulatory Visit (HOSPITAL_COMMUNITY): Payer: BLUE CROSS/BLUE SHIELD

## 2015-01-04 ENCOUNTER — Encounter (HOSPITAL_COMMUNITY): Payer: Self-pay | Admitting: General Practice

## 2015-01-04 DIAGNOSIS — R079 Chest pain, unspecified: Secondary | ICD-10-CM | POA: Diagnosis not present

## 2015-01-04 DIAGNOSIS — R072 Precordial pain: Secondary | ICD-10-CM | POA: Diagnosis not present

## 2015-01-04 HISTORY — PX: CARDIOVASCULAR STRESS TEST: SHX262

## 2015-01-04 LAB — LIPID PANEL
CHOL/HDL RATIO: 3.3 ratio
Cholesterol: 184 mg/dL (ref 0–200)
HDL: 55 mg/dL (ref >40)
LDL CALC: 122 mg/dL — AB (ref 0–99)
TRIGLYCERIDES: 34 mg/dL (ref <150)
VLDL: 7 mg/dL (ref 0–40)

## 2015-01-04 LAB — TROPONIN I: Troponin I: <0.03 ng/mL (ref <0.031)

## 2015-01-04 LAB — BASIC METABOLIC PANEL
ANION GAP: 7 (ref 5–15)
BUN: 14 mg/dL (ref 6–20)
CHLORIDE: 104 mmol/L (ref 101–111)
CO2: 28 mmol/L (ref 22–32)
Calcium: 9.4 mg/dL (ref 8.9–10.3)
Creatinine, Ser: 0.97 mg/dL (ref 0.44–1.00)
GFR calc non Af Amer: >60 mL/min (ref >60)
GLUCOSE: 94 mg/dL (ref 65–99)
POTASSIUM: 4 mmol/L (ref 3.5–5.1)
Sodium: 139 mmol/L (ref 135–145)

## 2015-01-04 LAB — TSH: TSH: 1.747 u[IU]/mL (ref 0.350–4.500)

## 2015-01-04 MED ORDER — TECHNETIUM TC 99M SESTAMIBI GENERIC - CARDIOLITE
10.0000 | Freq: Once | INTRAVENOUS | Status: AC | PRN
  Administered 2015-01-04: 10 via INTRAVENOUS

## 2015-01-04 MED ORDER — GI COCKTAIL ~~LOC~~
30.0000 mL | Freq: Once | ORAL | Status: AC
  Administered 2015-01-04: 30 mL via ORAL
  Filled 2015-01-04 (×2): qty 30

## 2015-01-04 MED ORDER — OMEPRAZOLE 40 MG PO CPDR: 40.0000 mg | DELAYED_RELEASE_CAPSULE | Freq: Two times a day (BID) | ORAL | Status: DC

## 2015-01-04 MED ORDER — TECHNETIUM TC 99M SESTAMIBI GENERIC - CARDIOLITE
30.0000 | Freq: Once | INTRAVENOUS | Status: AC | PRN
  Administered 2015-01-04: 30 via INTRAVENOUS

## 2015-01-04 MED ORDER — ACETAMINOPHEN 325 MG PO TABS
ORAL_TABLET | ORAL | Status: AC
  Filled 2015-01-04: qty 2

## 2015-01-04 NOTE — Progress Notes (Signed)
GXT MV performed, [place] to read. 1 day study.  Theodore Demark, Cordelia Poche 01/04/2015 10:18 AM Beeper [phone]

## 2015-01-04 NOTE — Progress Notes (Signed)
Patient Name: Caroline Keller Date of Encounter: 01/04/2015  Principal Problem:   Substernal chest pain Active Problems:   GERD (gastroesophageal reflux disease)   PVC's (premature ventricular contractions)   PAC (premature atrial contraction)   Benign essential HTN   Chest pain   Primary Cardiologist: Dr Mayford Knife  Patient Profile: 54 yo female w/ hx HTN, Hep C, GERD, hx palpitations. Admitted 09/19 w/ 2 days of chest heaviness.  SUBJECTIVE: Still with chest heaviness at a 5/10, worst was 7/10. Has not been a zero in > 48 hr  OBJECTIVE Filed Vitals:   01/03/15 2327 01/04/15 0504 01/04/15 0737 01/04/15 0954  BP: 106/53 93/63 100/65 126/77  Pulse: 72 76 62 73  Temp: 97.6 F (36.4 C) 97.8 F (36.6 C) 98.1 F (36.7 C)   TempSrc: Oral Oral Oral   Resp: 14 18    Height:      Weight:      SpO2: 98% 97% 98%     Intake/Output Summary (Last 24 hours) at 01/04/15 1008 Last data filed at 01/03/15 2300  Gross per 24 hour  Intake    480 ml  Output    200 ml  Net    280 ml   Filed Weights   01/03/15 1415 01/03/15 1858  Weight: 165 lb (74.844 kg) 169 lb 8.5 oz (76.9 kg)    PHYSICAL EXAM General: Well developed, well nourished, female in no acute distress. Head: Normocephalic, atraumatic.  Neck: Supple without bruits, JVD not elevated. Lungs:  Resp regular and unlabored, CTA. Heart: RRR, S1, S2, no S3, S4, or murmur; no rub. Abdomen: Soft, non-tender, non-distended, BS + x 4.  Extremities: No clubbing, cyanosis, edema.  Neuro: Alert and oriented X 3. Moves all extremities spontaneously. Psych: Normal affect.  LABS: CBC: Recent Labs  01/03/15 1430 01/03/15 2043  WBC 5.5 4.9  HGB 12.9 12.1  HCT 38.1 35.6*  MCV 91.4 89.9  PLT 190 171   Basic Metabolic Panel: Recent Labs  01/03/15 1430 01/03/15 2043  NA 139  --   K 3.9  --   CL 105  --   CO2 27  --   GLUCOSE 145*  --   BUN 14  --   CREATININE 0.87 0.91  CALCIUM 9.5  --    Cardiac Enzymes: Recent Labs  01/03/15 1430 01/03/15 2043 01/04/15 0211  TROPONINI <0.03 <0.03 <0.03   D-dimer: Recent Labs  01/03/15 1430  DDIMER <0.27   Fasting Lipid Panel: Recent Labs  01/04/15 0211  CHOL 184  HDL 55  LDLCALC 122*  TRIG 34  CHOLHDL 3.3   Thyroid Function Tests: Recent Labs  01/04/15 0211  TSH 1.747   TELE:  SR, seen in nuc med      Radiology/Studies: Dg Chest 2 View 01/03/2015   CLINICAL DATA:  Chest pain, heaviness started yesterday while cleaning lb. History of hepatitis-C, hypertension.  EXAM: CHEST  2 VIEW  COMPARISON:  06/16/2006  FINDINGS: The heart size and mediastinal contours are within normal limits. Both lungs are clear. The visualized skeletal structures are unremarkable.  IMPRESSION: No active cardiopulmonary disease.   Electronically Signed   By: Charlett Nose M.D.   On: 01/03/2015 14:55   Current Medications:  . aspirin EC  81 mg Oral Daily  . calcium carbonate  1 tablet Oral BID WC  . [START ON 01/05/2015] diltiazem  360 mg Oral [id number]  . enoxaparin (LOVENOX) injection  40 mg Subcutaneous Q24H  . irbesartan  300  mg Oral Daily   And  . hydrochlorothiazide  12.5 mg Oral Daily  . pantoprazole  40 mg Oral Daily      ASSESSMENT AND PLAN: Principal Problem:   Substernal chest pain - ez neg MI, ECG not acute - for MV today, GXT, to assess for ischemia - no further eval if low-risk and EF normal  Otherwise, continue home rx. Will try GI cocktail for pain, if this does not help, consider NSAIDS. Active Problems:   GERD (gastroesophageal reflux disease)   PVC's (premature ventricular contractions)   PAC (premature atrial contraction)   Benign essential HTN   Chest pain   Signed, Theodore Demark , PA-C 10:08 AM 01/04/2015  Patient seen, examined. Available data reviewed. Agree with findings, assessment, and plan as outlined by Theodore Demark, PA-C. The patient is independently interviewed and examined. She has just returned from nuclear medicine. She  exercised on the treadmill with no change in her constant substernal chest discomfort. She appears comfortable on exam. Heart is regular rate and rhythm without murmur. There is no peripheral edema. Lungs are clear. Agree with plans as outlined above. As long as her nuclear scan is normal/low risk, I would not anticipate any further evaluation. If patient's pain appears noncardiac, would increase her PPI to either omeprazole 40 mg or Protonix 40 mg daily  Tonny Bollman, M.D. 01/04/2015 11:57 AM ]

## 2015-01-04 NOTE — Discharge Summary (Signed)
CARDIOLOGY DISCHARGE SUMMARY   Patient ID: Caroline Keller MRN: [id number] DOB/AGE: Mar 23, 1961 54 y.o.  Admit date: 01/03/2015 Discharge date: 01/04/2015  PCP: Gaye Alken, MD Primary Cardiologist: Dr Mayford Knife  Primary Discharge Diagnosis:  Substernal chest pain - ez neg MI and MV neg scar or ischemia  Secondary Discharge Diagnosis:  Active Problems:   GERD (gastroesophageal reflux disease)   PVC's (premature ventricular contractions)   PAC (premature atrial contraction)   Benign essential HTN   Chest pain  Procedures: GXT Myoview  Hospital Course: Caroline Keller is a 54 y.o. female with a history of HTN, Hep C, GERD, and palpitations. She developed chest heaviness and came to the hospital where she was admitted for further evaluation and treatment.  Her cardiac enzymes were negative for MI. She had a GXT Myoview on 09/20, results are below. It was a low risk study with no scar or ischemia and normal EF.   Her PPI had been increased and there were no critical abnormalities in her labs. No further inpatient workup was indicated and she is considered stable for discharge, to follow up as an outpatient.  Labs:   Lab Results  Component Value Date   WBC 4.9 01/03/2015   HGB 12.1 01/03/2015   HCT 35.6* 01/03/2015   MCV 89.9 01/03/2015   PLT 171 01/03/2015     Recent Labs Lab 01/04/15 1211  NA 139  K 4.0  CL 104  CO2 28  BUN 14  CREATININE 0.97  CALCIUM 9.4  GLUCOSE 94    Recent Labs  01/03/15 2043 01/04/15 0211 01/04/15 1211  TROPONINI <0.03 <0.03 <0.03   Lipid Panel     Component Value Date/Time   CHOL 184 01/04/2015 0211   TRIG 34 01/04/2015 0211   HDL 55 01/04/2015 0211   CHOLHDL 3.3 01/04/2015 0211   VLDL 7 01/04/2015 0211   LDLCALC 122* 01/04/2015 0211     Radiology: Dg Chest 2 View 01/03/2015   CLINICAL DATA:  Chest pain, heaviness started yesterday while cleaning lb. History of hepatitis-C, hypertension.  EXAM: CHEST  2 VIEW  COMPARISON:   06/16/2006  FINDINGS: The heart size and mediastinal contours are within normal limits. Both lungs are clear. The visualized skeletal structures are unremarkable.  IMPRESSION: No active cardiopulmonary disease.   Electronically Signed   By: Charlett Nose M.D.   On: 01/03/2015 14:55   Nm Myocar Multi W/spect W/wall Motion / Ef 01/04/2015   CLINICAL DATA:  Chest pain and heaviness, palpitations. Hypertension.  EXAM: MYOCARDIAL IMAGING WITH SPECT (REST AND EXERCISE)  GATED LEFT VENTRICULAR WALL MOTION STUDY  LEFT VENTRICULAR EJECTION FRACTION  TECHNIQUE: Standard myocardial SPECT imaging was performed after resting intravenous injection of 10 mCi Tc-35m sestamibi. Subsequently, exercise tolerance test was performed by the patient under the supervision of the Cardiology staff. At peak-stress, 30 mCi Tc-30m sestamibi was injected intravenously and standard myocardial SPECT imaging was performed. Quantitative gated imaging was also performed to evaluate left ventricular wall motion, and estimate left ventricular ejection fraction.  COMPARISON:  None.  FINDINGS: Perfusion: Allowing for inferior and apical attenuation artifact on the rest imaging, No significant decreased activity in the left ventricle on stress imaging to suggest reversible ischemia or infarction.  Wall Motion: Normal left ventricular wall motion. No left ventricular dilation.  Left Ventricular Ejection Fraction: 59 %  End diastolic volume 86 ml  End systolic volume 35 ml  IMPRESSION: 1. No reversible ischemia or infarction.  2. Normal left ventricular wall motion.  3. Left ventricular ejection fraction 59%  4. Low-risk stress test findings*.  *2012 Appropriate Use Criteria for Coronary Revascularization Focused Update: J Am Coll Cardiol. 2012;59(9):857-881. http://content.dementiazones.com.aspx?articleid=1201161   Electronically Signed   By: Judie Petit.  Shick M.D.   On: 01/04/2015 13:49   EKG: 01/04/2015 SR, no acute changes Vent. rate 84 BPM PR  interval 166 ms QRS duration 92 ms QT/QTc 356/420 ms P-R-T axes 72 71 55  FOLLOW UP PLANS AND APPOINTMENTS No Known Allergies   Medication List    TAKE these medications        calcium carbonate 1250 MG capsule  Take 1,250 mg by mouth 2 (two) times daily with a meal.     diltiazem 360 MG 24 hr capsule  Commonly known as:  CARDIZEM CD  Take 1 capsule (360 mg total) by mouth every morning.     omeprazole 40 MG capsule  Commonly known as:  PRILOSEC  Take 1 capsule (40 mg total) by mouth 2 (two) times daily before a meal.     oxyCODONE-acetaminophen 5-325 MG per tablet  Commonly known as:  PERCOCET/ROXICET  Take 1 tablet by mouth every 6 (six) hours as needed.     telmisartan-hydrochlorothiazide 80-12.5 MG per tablet  Commonly known as:  MICARDIS HCT  Take 1 tablet by mouth daily.        Discharge Instructions    Diet - low sodium heart healthy    Complete by:  As directed      Increase activity slowly    Complete by:  As directed           Follow-up Information    Schedule an appointment as soon as possible for a visit with Gaye Alken, MD.   Specialty:  Family Medicine   Contact information:   [address] [place] [address] [phone]       Follow up with Quintella Reichert, MD.   Specialty:  Cardiology   Why:  As needed   Contact information:   [address] [place] [address] [phone]       BRING ALL MEDICATIONS WITH YOU TO FOLLOW UP APPOINTMENTS  Time spent with patient to include physician time: 44 min Signed: Theodore Demark, PA-C 01/04/2015, 3:37 PM Co-Sign MD

## 2015-01-05 ENCOUNTER — Telehealth: Payer: Self-pay | Admitting: Internal Medicine

## 2015-01-05 NOTE — Telephone Encounter (Signed)
TCM call placed.  LMTC 1:24pm

## 2015-01-05 NOTE — Telephone Encounter (Signed)
D/C phone call .Marland Kitchen Appt is on 01/17/15 At 12:45pm w/ Herma Carson at the [facility] office   Thanks

## 2015-01-10 NOTE — Telephone Encounter (Signed)
TCM call to patient no answer.LMTC. 

## 2015-01-11 NOTE — Telephone Encounter (Signed)
Patient has changed her appointment  Understands to bring in any medications she is on to the appointment  Has no questions or concerns post dc

## 2015-01-17 ENCOUNTER — Encounter: Payer: BLUE CROSS/BLUE SHIELD | Admitting: Physician Assistant

## 2015-02-07 NOTE — Progress Notes (Signed)
Cardiology Office Note   Date:  02/08/2015   ID:  Caroline Keller, DOB [birth year], MRN [id number]  PCP:  Gaye Alken, MD    Chief Complaint  Patient presents with  . Chest Pain  . Hypertension      History of Present Illness: This is a 54yo female with a history of HTN, hep C, PAC's, PVC's and GERD who presents today for followup of her. She was recently admitted to [facility] for chest pain and ruled out for MI and underwent stress myoview showing no scar or ischemia and normal LVF.  Her PPI was increased.  She now is here for folllowup.  She is doing well. Since her hospitalization she denies any chest pain, SOB, DOE, LE edema, dizziness or syncope. Occasionally she will have a skipped heart beat and has had a few days where they have been more frequent. She notices them more when her anxiety flares.      Past Medical History  Diagnosis Date  . Hepatitis C   . Hypertension   . GERD (gastroesophageal reflux disease)   . History of cholecystectomy   . History of C-section   . Arthritis of low back   . Palpitations   . Anxiety   . History of hiatal hernia   . PVC (premature ventricular contraction)   . PAC (premature atrial contraction)     Past Surgical History  Procedure Laterality Date  . Cholecystectomy    . Cesarean section    . Cardiovascular stress test  01/04/2015     Current Outpatient Prescriptions  Medication Sig Dispense Refill  . calcium carbonate 1250 MG capsule Take 1,250 mg by mouth 2 (two) times daily with a meal.    . diltiazem (CARDIZEM CD) 360 MG 24 hr capsule Take 1 capsule (360 mg total) by mouth every morning. 90 capsule 3  . omeprazole (PRILOSEC) 40 MG capsule Take 1 capsule (40 mg total) by mouth 2 (two) times daily before a meal. 60 capsule 0  . oxyCODONE-acetaminophen (PERCOCET/ROXICET) 5-325 MG per tablet Take 1 tablet by mouth every 6 (six) hours as needed.  0  . telmisartan-hydrochlorothiazide (MICARDIS HCT)  80-12.5 MG per tablet Take 1 tablet by mouth daily. 90 tablet 3   No current facility-administered medications for this visit.    Allergies:   Review of patient's allergies indicates no known allergies.    Social History:  The patient  reports that she quit smoking about 33 years ago. She has never used smokeless tobacco. She reports that she does not drink alcohol or use illicit drugs.   Family History:  The patient's family history includes Breast cancer in her sister; Coronary artery disease in her father; Lung cancer in her mother.    ROS:  Please see the history of present illness.   Otherwise, review of systems are positive for none.   All other systems are reviewed and negative.    PHYSICAL EXAM: VS:  BP 122/78 mmHg  Pulse 84  Ht  (1.626 m)  Wt 175 lb 1.9 oz (79.434 kg)  BMI 30.04 kg/m2  SpO2 98% , BMI Body mass index is 30.04 kg/(m^2). GEN: Well nourished, well developed, in no acute distress HEENT: normal Neck: no JVD, carotid bruits, or masses Cardiac: RRR; no murmurs, rubs, or gallops,no edema  Respiratory:  clear to auscultation bilaterally, normal work of breathing GI: soft, nontender, nondistended, + BS MS:  no deformity or atrophy Skin: warm and dry, no rash Neuro:  Strength and sensation are intact Psych: euthymic mood, full affect   EKG:  EKG is not ordered today.    Recent Labs: 03/05/2014: ALT 26 01/03/2015: Hemoglobin 12.1; Platelets 171 01/04/2015: BUN 14; Creatinine, Ser 0.97; Potassium 4.0; Sodium 139; TSH 1.747    Lipid Panel    Component Value Date/Time   CHOL 184 01/04/2015 0211   TRIG 34 01/04/2015 0211   HDL 55 01/04/2015 0211   CHOLHDL 3.3 01/04/2015 0211   VLDL 7 01/04/2015 0211   LDLCALC 122* 01/04/2015 0211      Wt Readings from Last 3 Encounters:  02/08/15 175 lb 1.9 oz (79.434 kg)  01/03/15 169 lb 8.5 oz (76.9 kg)  03/23/14 167 lb (75.751 kg)        ASSESSMENT AND PLAN:  1.  Atypical noncardiac chest pain with  normal LVF and no ischemic by stress testing.   2.  GERD - I suspect that her recent CP may have been due to the hiatal hernia and GERD.  She is going to make an appt with her GI MD.   3.  PVC's/PAC's - controlled on CCB.  She will let me know if she wants to try a BB for further suppression during breakthrough.   4.  HTN with controlled BP on Cardizem/Micardis   Current medicines are reviewed at length with the patient today.  The patient does not have concerns regarding medicines.  The following changes have been made:  no change  Labs/ tests ordered today: See above Assessment and Plan No orders of the defined types were placed in this encounter.     Disposition:   FU with me in 1 year  Signed, Quintella ReichertURNER,TRACI R, MD  02/08/2015 8:35 AM    [facility] [address], [place], [place]  [address] Phone: [phone]; Fax: [phone]

## 2015-02-08 ENCOUNTER — Ambulatory Visit (INDEPENDENT_AMBULATORY_CARE_PROVIDER_SITE_OTHER): Payer: BLUE CROSS/BLUE SHIELD | Admitting: Cardiology

## 2015-02-08 ENCOUNTER — Encounter: Payer: Self-pay | Admitting: Cardiology

## 2015-02-08 VITALS — BP 122/78 | HR 84 | Ht 64.0 in | Wt 175.1 lb

## 2015-02-08 DIAGNOSIS — I493 Ventricular premature depolarization: Secondary | ICD-10-CM | POA: Diagnosis not present

## 2015-02-08 DIAGNOSIS — I491 Atrial premature depolarization: Secondary | ICD-10-CM | POA: Diagnosis not present

## 2015-02-08 DIAGNOSIS — R079 Chest pain, unspecified: Secondary | ICD-10-CM | POA: Diagnosis not present

## 2015-02-08 DIAGNOSIS — K219 Gastro-esophageal reflux disease without esophagitis: Secondary | ICD-10-CM

## 2015-02-08 DIAGNOSIS — I10 Essential (primary) hypertension: Secondary | ICD-10-CM | POA: Diagnosis not present

## 2015-02-08 MED ORDER — TELMISARTAN-HCTZ 80-12.5 MG PO TABS: 1.0000 | ORAL_TABLET | Freq: Every day | ORAL | Status: DC

## 2015-02-08 MED ORDER — DILTIAZEM HCL ER COATED BEADS 360 MG PO CP24: 360.0000 mg | ORAL_CAPSULE | ORAL | Status: DC

## 2015-02-08 NOTE — Patient Instructions (Signed)

## 2016-01-06 ENCOUNTER — Telehealth: Payer: Self-pay | Admitting: Cardiology

## 2016-01-06 MED ORDER — TELMISARTAN-HCTZ 80-12.5 MG PO TABS: 1.0000 | ORAL_TABLET | Freq: Every day | ORAL | 0 refills | Status: DC

## 2016-01-06 NOTE — Telephone Encounter (Signed)
CALLED PT BACK TO GET MEDS TO REFILL.

## 2016-01-06 NOTE — Telephone Encounter (Signed)
New Message  Pt call requesting to speka with RN about refills for her medication. Pt wants to know since she does mail prescriptions how would she be able to get the refills up until the day of her appt. Please call back to discuss

## 2016-02-23 NOTE — Progress Notes (Signed)
Cardiology Office Note    Date:  02/24/2016   ID:  Caroline Keller, DOB Jul 18, 1960, MRN [id number]  PCP:  Jarrett SohoWharton, Courtney, PA-C  Cardiologist:  Armanda Magicraci Turner, MD   Chief Complaint  Patient presents with  . Follow-up    PVCs, PACs, HTN    History of Present Illness:  Caroline Keller is a 55 y.o. female with a history of HTN, hep C, PAC's, PVC's and GERD who presents today for followup.  She is doing well. She denies any chest pain, SOB, DOE, LE edema, dizziness or syncope. Occasionally she will have a skipped heart beat. She notices them more when her anxiety flares.       Past Medical History:  Diagnosis Date  . Anxiety   . Arthritis of low back   . GERD (gastroesophageal reflux disease)   . Hepatitis C   . History of C-section   . History of cholecystectomy   . History of hiatal hernia   . Hypertension   . PAC (premature atrial contraction)   . Palpitations   . PVC (premature ventricular contraction)     Past Surgical History:  Procedure Laterality Date  . CARDIOVASCULAR STRESS TEST  01/04/2015  . CESAREAN SECTION    . CHOLECYSTECTOMY      Current Medications: Outpatient Medications Prior to Visit  Medication Sig Dispense Refill  . calcium carbonate 1250 MG capsule Take 1,250 mg by mouth 2 (two) times daily with a meal.    . diltiazem (CARDIZEM CD) 360 MG 24 hr capsule Take 1 capsule (360 mg total) by mouth every morning. 90 capsule 3  . omeprazole (PRILOSEC) 40 MG capsule Take 1 capsule (40 mg total) by mouth 2 (two) times daily before a meal. 60 capsule 0  . telmisartan-hydrochlorothiazide (MICARDIS HCT) 80-12.5 MG tablet Take 1 tablet by mouth daily. 30 tablet 0  . oxyCODONE-acetaminophen (PERCOCET/ROXICET) 5-325 MG per tablet Take 1 tablet by mouth every 6 (six) hours as needed.  0   No facility-administered medications prior to visit.      Allergies:   Patient has no known allergies.   Social History   Social History  . Marital status: Married   Spouse name: N/A  . Number of children: N/A  . Years of education: N/A   Social History Main Topics  . Smoking status: Former Smoker    Quit date: 04/16/1981  . Smokeless tobacco: Never Used  . Alcohol use No  . Drug use: No  . Sexual activity: Not Asked   Other Topics Concern  . None   Social History Narrative  . None     Family History:  The patient's family history includes Breast cancer in her sister; Coronary artery disease in her father; Lung cancer in her mother.   ROS:   Please see the history of present illness.    ROS All other systems reviewed and are negative.  No flowsheet data found.     PHYSICAL EXAM:   VS:  BP 102/72   Pulse 65   Ht 5\' 4"  (1.626 m)   Wt 181 lb (82.1 kg)   BMI 31.07 kg/m    GEN: Well nourished, well developed, in no acute distress  HEENT: normal  Neck: no JVD, carotid bruits, or masses Cardiac: RRR; no murmurs, rubs, or gallops,no edema.  Intact distal pulses bilaterally.  Respiratory:  clear to auscultation bilaterally, normal work of breathing GI: soft, nontender, nondistended, + BS MS: no deformity or atrophy  Skin: warm and dry,  no rash Neuro:  Alert and Oriented x 3, Strength and sensation are intact Psych: euthymic mood, full affect  Wt Readings from Last 3 Encounters:  02/24/16 181 lb (82.1 kg)  02/08/15 175 lb 1.9 oz (79.4 kg)  01/03/15 169 lb 8.5 oz (76.9 kg)      Studies/Labs Reviewed:   EKG:  EKG is ordered today.  The ekg ordered today demonstrates NSR at 65bpm with no ST changes  Recent Labs: No results found for requested labs within last 8760 hours.   Lipid Panel    Component Value Date/Time   CHOL 184 01/04/2015 0211   TRIG 34 01/04/2015 0211   HDL 55 01/04/2015 0211   CHOLHDL 3.3 01/04/2015 0211   VLDL 7 01/04/2015 0211   LDLCALC 122 (H) 01/04/2015 0211    Additional studies/ records that were reviewed today include:  none    ASSESSMENT:    1. PVC's (premature ventricular contractions)   2.  PAC (premature atrial contraction)   3. Benign essential HTN      PLAN:  In order of problems listed above:  1. PVC's well controlled on CCB.  Continue. 2. PACs - controlled on CCB.  Continue at current dose.   3. HTN - Bp controlled.  Continue CCB/ARB and diuretic.    Medication Adjustments/Labs and Tests Ordered: Current medicines are reviewed at length with the patient today.  Concerns regarding medicines are outlined above.  Medication changes, Labs and Tests ordered today are listed in the Patient Instructions below.  There are no Patient Instructions on file for this visit.   Signed, Armanda Magicraci Turner, MD  02/24/2016 8:33 AM    [facility] [address], [place], [place]  [address] Phone: [phone]; Fax: [phone]

## 2016-02-24 ENCOUNTER — Ambulatory Visit (INDEPENDENT_AMBULATORY_CARE_PROVIDER_SITE_OTHER): Payer: BLUE CROSS/BLUE SHIELD | Admitting: Cardiology

## 2016-02-24 ENCOUNTER — Encounter: Payer: Self-pay | Admitting: Cardiology

## 2016-02-24 VITALS — BP 102/72 | HR 65 | Ht 64.0 in | Wt 181.0 lb

## 2016-02-24 DIAGNOSIS — I493 Ventricular premature depolarization: Secondary | ICD-10-CM | POA: Diagnosis not present

## 2016-02-24 DIAGNOSIS — I10 Essential (primary) hypertension: Secondary | ICD-10-CM

## 2016-02-24 DIAGNOSIS — I491 Atrial premature depolarization: Secondary | ICD-10-CM

## 2016-02-24 LAB — BASIC METABOLIC PANEL
BUN: 24 mg/dL (ref 7–25)
CALCIUM: 9.2 mg/dL (ref 8.6–10.4)
CO2: 25 mmol/L (ref 20–31)
Chloride: 102 mmol/L (ref 98–110)
Creat: 0.8 mg/dL (ref 0.50–1.05)
GLUCOSE: 87 mg/dL (ref 65–99)
Potassium: 4 mmol/L (ref 3.5–5.3)
SODIUM: 136 mmol/L (ref 135–146)

## 2016-02-24 MED ORDER — DILTIAZEM HCL ER COATED BEADS 360 MG PO CP24: 360.0000 mg | ORAL_CAPSULE | ORAL | 3 refills | Status: DC

## 2016-02-24 MED ORDER — TELMISARTAN-HCTZ 80-12.5 MG PO TABS: 1.0000 | ORAL_TABLET | Freq: Every day | ORAL | 11 refills | Status: DC

## 2016-02-24 NOTE — Patient Instructions (Signed)
Medication Instructions:  Your physician recommends that you continue on your current medications as directed. Please refer to the Current Medication list given to you today.   Labwork: TODAY: BMET  Testing/Procedures: None  Follow-Up: Your physician wants you to follow-up in: 1 year with Dr. Turner. You will receive a reminder letter in the mail two months in advance. If you don't receive a letter, please call our office to schedule the follow-up appointment.   Any Other Special Instructions Will Be Listed Below (If Applicable).     If you need a refill on your cardiac medications before your next appointment, please call your pharmacy.   

## 2016-06-04 ENCOUNTER — Other Ambulatory Visit: Payer: Self-pay | Admitting: Gastroenterology

## 2016-06-04 DIAGNOSIS — B182 Chronic viral hepatitis C: Secondary | ICD-10-CM

## 2016-06-07 ENCOUNTER — Other Ambulatory Visit: Payer: BLUE CROSS/BLUE SHIELD

## 2016-06-13 ENCOUNTER — Ambulatory Visit
Admission: RE | Admit: 2016-06-13 | Discharge: 2016-06-13 | Disposition: A | Discharge status: Home / Self Care | Payer: BLUE CROSS/BLUE SHIELD | Source: Ambulatory Visit | Attending: Gastroenterology | Admitting: Gastroenterology

## 2016-06-13 DIAGNOSIS — B182 Chronic viral hepatitis C: Secondary | ICD-10-CM

## 2016-08-30 ENCOUNTER — Ambulatory Visit (INDEPENDENT_AMBULATORY_CARE_PROVIDER_SITE_OTHER): Payer: BLUE CROSS/BLUE SHIELD | Admitting: Orthopaedic Surgery

## 2016-08-30 DIAGNOSIS — M79642 Pain in left hand: Secondary | ICD-10-CM

## 2016-08-30 DIAGNOSIS — M5442 Lumbago with sciatica, left side: Secondary | ICD-10-CM | POA: Diagnosis not present

## 2016-08-30 DIAGNOSIS — M79641 Pain in right hand: Secondary | ICD-10-CM

## 2016-08-30 MED ORDER — METHYLPREDNISOLONE 4 MG PO TABS: ORAL_TABLET | ORAL | 0 refills | Status: DC

## 2016-08-30 MED ORDER — OXYCODONE-ACETAMINOPHEN 5-325 MG PO TABS: 1.0000 | ORAL_TABLET | Freq: Three times a day (TID) | ORAL | 0 refills | Status: DC | PRN

## 2016-08-30 MED ORDER — TIZANIDINE HCL 4 MG PO TABS: 4.0000 mg | ORAL_TABLET | Freq: Two times a day (BID) | ORAL | 0 refills | Status: DC | PRN

## 2016-08-30 NOTE — Progress Notes (Signed)
Office Visit Note   Patient: Caroline Keller           Date of Birth: 04-09-1961           MRN: [id number] Visit Date: 08/30/2016              Requested by: Jarrett Soho, PA-C [address] [place], [place] [address] PCP: Jarrett Soho, PA-C   Assessment & Plan: Visit Diagnoses:  1. Acute left-sided low back pain with left-sided sciatica   2. Bilateral hand pain     Plan: I'm going to put her on a six-day steroid taper as well as a muscle relaxant and a pain medicine. We do need to obtain an MRI of her lumbar spine due to the new findings of severe left-sided sciatica. She is requesting an intervention to Dr. Alvester Morin with an epidural steroid injection but would need to see where the pathology is in terms of her left side because this is new. As far as her hands go at some point we may want to inject over the A1 pulley on both sides of the first ray see if the steroid taper an over-the-counter topical anti-inflammatory as well as the other medications helps.  Follow-Up Instructions: Return in about 2 weeks (around 09/13/2016).   Orders:  No orders of the defined types were placed in this encounter.  No orders of the defined types were placed in this encounter.     Procedures: No procedures performed   Clinical Data: No additional findings.   Subjective: Chief Complaint  Patient presents with  . Lower Back - Pain  . Left Hand - Pain  . Right Hand - Pain  The patient is well-known to me but I haven't seen her in a while. She comes in with chief complaint of sciatica going down her left side. She had right sided problems before and actually in 2015 had a right transforaminal L3 injection by Dr. Alvester Morin that helped greatly. However symptoms are now her left side and is been slowly getting worse. 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 She'll see look at all these areas today. She's not taking really anti-inflammatories right now that is occasional over-the-counter anti-inflammatories. She has no other problems is released from chief complaint of low back pain with left-sided sciatica as well as bilateral hand pain. It is started detrimental effect direct is daily living, her quality of life, and her mobility.  HPI  Review of Systems She currently denies any headache, chest pain, short of breath, fever, chills, nausea, vomiting.  Objective: Vital Signs: There were no vitals taken for this visit.  Physical Exam She is alert and oriented 3 and in no acute distress Ortho Exam Examination of her low back does show pain in the paraspinal muscles to the left side. She has a positive straight leg raise a left side. She has numbness that goes all the down her foot and seems to be more L4 and L5. She doesn't have any weakness in her foot though.  Examination of both hands show pain over the A1 pulley over both thumbs but no active triggering. It is very painful in these areas. She does not hurt at the [facility] and there is a negative grind test. She  has a negative Finkelstein test bilaterally as well. Specialty Comments:  No specialty comments available.  Imaging: No results found.   PMFS History: Patient Active Problem List   Diagnosis Date Noted  . Substernal chest pain 01/03/2015  . PVC's (premature ventricular contractions) 03/23/2014  . PAC (premature atrial contraction) 03/23/2014  . Benign essential HTN 03/23/2014  . Hepatitis C 12/08/2013  . GERD (gastroesophageal reflux disease)    Past Medical History:  Diagnosis Date  . Anxiety   . Arthritis of low back   . GERD (gastroesophageal reflux disease)   . Hepatitis C   . History of C-section   . History of cholecystectomy   . History of hiatal hernia   . Hypertension   . PAC (premature  atrial contraction)   . Palpitations   . PVC (premature ventricular contraction)     Family History  Problem Relation Age of Onset  . Lung cancer Mother   . Coronary artery disease Father   . Breast cancer Sister     Past Surgical History:  Procedure Laterality Date  . CARDIOVASCULAR STRESS TEST  01/04/2015  . CESAREAN SECTION    . CHOLECYSTECTOMY     Social History   Occupational History  . Not on file.   Social History Main Topics  . Smoking status: Former Smoker    Quit date: 04/16/1981  . Smokeless tobacco: Never Used  . Alcohol use No  . Drug use: No  . Sexual activity: Not on file

## 2016-08-30 NOTE — Addendum Note (Signed)
Addended by: Albertina ParrGARCIA, Ryli Standlee on: 08/30/2016 03:53 PM   Modules accepted: Orders

## 2016-09-09 ENCOUNTER — Ambulatory Visit
Admission: RE | Admit: 2016-09-09 | Discharge: 2016-09-09 | Disposition: A | Discharge status: Home / Self Care | Payer: BLUE CROSS/BLUE SHIELD | Source: Ambulatory Visit | Attending: Orthopaedic Surgery | Admitting: Orthopaedic Surgery

## 2016-09-09 DIAGNOSIS — M5442 Lumbago with sciatica, left side: Secondary | ICD-10-CM

## 2016-09-13 ENCOUNTER — Ambulatory Visit (INDEPENDENT_AMBULATORY_CARE_PROVIDER_SITE_OTHER): Payer: BLUE CROSS/BLUE SHIELD | Admitting: Orthopaedic Surgery

## 2016-09-13 DIAGNOSIS — M65312 Trigger thumb, left thumb: Secondary | ICD-10-CM | POA: Diagnosis not present

## 2016-09-13 DIAGNOSIS — G8929 Other chronic pain: Secondary | ICD-10-CM | POA: Diagnosis not present

## 2016-09-13 DIAGNOSIS — M65311 Trigger thumb, right thumb: Secondary | ICD-10-CM

## 2016-09-13 DIAGNOSIS — M5442 Lumbago with sciatica, left side: Secondary | ICD-10-CM | POA: Diagnosis not present

## 2016-09-13 MED ORDER — METHYLPREDNISOLONE ACETATE 40 MG/ML IJ SUSP
40.0000 mg | INTRAMUSCULAR | Status: AC | PRN
  Administered 2016-09-13: 40 mg

## 2016-09-13 MED ORDER — LIDOCAINE HCL 1 % IJ SOLN
1.0000 mL | INTRAMUSCULAR | Status: AC | PRN
  Administered 2016-09-13: 1 mL

## 2016-09-13 NOTE — Progress Notes (Signed)
Office Visit Note   Patient: Caroline Keller [place]           Date of Birth: [birth year]           MRN: [id number] Visit Date: 09/13/2016              Requested by: Jarrett SohoWharton, Courtney, PA-C [address] [place], [place] [address] PCP: Jarrett SohoWharton, Courtney, PA-C   Assessment & Plan: Visit Diagnoses:  1. Chronic left-sided low back pain with left-sided sciatica   2. Trigger thumb, left thumb   3. Trigger thumb, right thumb     Plan: I went over her MRI with her in detail. Her symptoms are consistent with the MRI findings in terms of the radicular symptoms she is having going down her left leg into her left foot. She would like an appointment with Dr. Alvester MorinNewton to be considered for an intervention injection at the left L5-S1 level. She is remotely had an injection by Dr. Alvester MorinNewton before and did well. She certainly understands that this may give her some relief but she may need surgical evaluation down the road depending on her response the injection. She also requested injections in her thumb on both right and left side at the A1 pulley due to pain and triggering. She understands risks and benefits of these injections and I provided a injection in each thumb A1 pulley without difficulty. She understands this needs pain medicine the near future she can give us a call.  Follow-Up Instructions: Return in about 4 weeks (around 10/11/2016).   Orders:  Orders Placed This Encounter  Procedures  . Hand/Upper Extremity Injection/Arthrocentesis  . Hand/Upper Extremity Injection/Arthrocentesis   No orders of the defined types were placed in this encounter.     Procedures: Hand/UE Inj Date/Time: 09/13/2016 3:44 PM Performed by: Kathryne HitchBLACKMAN, Kairee Kozma Y Authorized by: Kathryne HitchBLACKMAN, Kerrion Kemppainen Y   Condition: trigger finger   Location:  Thumb Site:  R thumb A1 Medications:  1 mL lidocaine 1 %; 40 mg methylPREDNISolone acetate 40 MG/ML Hand/UE Inj Date/Time: 09/13/2016 3:44 PM Performed by: Kathryne HitchBLACKMAN, Treyven Lafauci  Y Authorized by: Kathryne HitchBLACKMAN, Auston Halfmann Y   Condition: trigger finger   Location:  Thumb Site:  L thumb A1 Medications:  1 mL lidocaine 1 %; 40 mg methylPREDNISolone acetate 40 MG/ML     Clinical Data: No additional findings.   Subjective: No chief complaint on file. The patient is following up today after having an MRI of her lumbar spine due to significant pain and radicular symptoms in her low back radiating down her left leg. She also wants me to reevaluate triggering and pain in both of her thumbs.  HPI  Review of Systems She still denies any change in bowel or bladder function. She denies any headache, chest pain, shortness of breath, fever, chills, nausea, vomiting.  Objective: Vital Signs: There were no vitals taken for this visit.  Physical Exam She is alert and oriented 3 and in no acute distress Ortho Exam She has significant pain in her back with flexion extension but more so with extension. She has a positive straight leg raise on the left side. She has subjective decreased sensation at L5 and on the bottom of her foot. Her reflexes though are normal. There was not significant weakness in her left leg comparing her left and right sides. Specialty Comments:  No specialty comments available.  Imaging: No results found. The MRI was reviewed independently and reviewed with her and of concern it does show a pars defect which does  show edema at L5 to left side. There is also a disc protrusion at the L5-S1 level they can be potentially affecting the nerves at this level which seem to correlate with her clinical exam findings.  PMFS History: Patient Active Problem List   Diagnosis Date Noted  . Chronic left-sided low back pain with left-sided sciatica 09/13/2016  . Trigger thumb, left thumb 09/13/2016  . Trigger thumb, right thumb 09/13/2016  . Substernal chest pain 01/03/2015  . PVC's (premature ventricular contractions) 03/23/2014  . PAC (premature atrial  contraction) 03/23/2014  . Benign essential HTN 03/23/2014  . Hepatitis C 12/08/2013  . GERD (gastroesophageal reflux disease)    Past Medical History:  Diagnosis Date  . Anxiety   . Arthritis of low back   . GERD (gastroesophageal reflux disease)   . Hepatitis C   . History of C-section   . History of cholecystectomy   . History of hiatal hernia   . Hypertension   . PAC (premature atrial contraction)   . Palpitations   . PVC (premature ventricular contraction)     Family History  Problem Relation Age of Onset  . Lung cancer Mother   . Coronary artery disease Father   . Breast cancer Sister     Past Surgical History:  Procedure Laterality Date  . CARDIOVASCULAR STRESS TEST  01/04/2015  . CESAREAN SECTION    . CHOLECYSTECTOMY     Social History   Occupational History  . Not on file.   Social History Main Topics  . Smoking status: Former Smoker    Quit date: 04/16/1981  . Smokeless tobacco: Never Used  . Alcohol use No  . Drug use: No  . Sexual activity: Not on file

## 2016-09-14 ENCOUNTER — Other Ambulatory Visit (INDEPENDENT_AMBULATORY_CARE_PROVIDER_SITE_OTHER): Payer: Self-pay

## 2016-09-14 DIAGNOSIS — M5442 Lumbago with sciatica, left side: Principal | ICD-10-CM

## 2016-09-14 DIAGNOSIS — G8929 Other chronic pain: Secondary | ICD-10-CM

## 2016-09-21 ENCOUNTER — Telehealth (INDEPENDENT_AMBULATORY_CARE_PROVIDER_SITE_OTHER): Payer: Self-pay

## 2016-09-21 ENCOUNTER — Other Ambulatory Visit (INDEPENDENT_AMBULATORY_CARE_PROVIDER_SITE_OTHER): Payer: Self-pay | Admitting: Family

## 2016-09-21 MED ORDER — OXYCODONE-ACETAMINOPHEN 5-325 MG PO TABS: 1.0000 | ORAL_TABLET | Freq: Two times a day (BID) | ORAL | 0 refills | Status: DC | PRN

## 2016-09-21 NOTE — Telephone Encounter (Signed)
[facility]? See below

## 2016-09-21 NOTE — Telephone Encounter (Signed)
LMOM for patient letting her know Rx ready at front desk  

## 2016-09-21 NOTE — Telephone Encounter (Signed)
Patient would like a Rx refill on Oxycodone.  Stated that injection is scheduled for June 18th.  CB# is [phone].

## 2016-09-21 NOTE — Telephone Encounter (Signed)
Please advise I can try to get someone to write for us if you agree

## 2016-09-21 NOTE — Telephone Encounter (Signed)
I'm ok with Percocet 5/325, but only 30# pills, with 1-2 twice daily as needed if someone can do it

## 2016-10-01 ENCOUNTER — Encounter (INDEPENDENT_AMBULATORY_CARE_PROVIDER_SITE_OTHER): Payer: Self-pay | Admitting: Physical Medicine and Rehabilitation

## 2016-10-01 ENCOUNTER — Ambulatory Visit (INDEPENDENT_AMBULATORY_CARE_PROVIDER_SITE_OTHER): Payer: BLUE CROSS/BLUE SHIELD

## 2016-10-01 ENCOUNTER — Ambulatory Visit (INDEPENDENT_AMBULATORY_CARE_PROVIDER_SITE_OTHER): Payer: BLUE CROSS/BLUE SHIELD | Admitting: Physical Medicine and Rehabilitation

## 2016-10-01 VITALS — BP 109/68 | HR 63

## 2016-10-01 DIAGNOSIS — M5416 Radiculopathy, lumbar region: Secondary | ICD-10-CM | POA: Diagnosis not present

## 2016-10-01 DIAGNOSIS — M5116 Intervertebral disc disorders with radiculopathy, lumbar region: Secondary | ICD-10-CM

## 2016-10-01 MED ORDER — METHYLPREDNISOLONE ACETATE 80 MG/ML IJ SUSP
80.0000 mg | Freq: Once | INTRAMUSCULAR | Status: AC
  Administered 2016-10-01: 80 mg

## 2016-10-01 MED ORDER — LIDOCAINE HCL (PF) 1 % IJ SOLN
2.0000 mL | Freq: Once | INTRAMUSCULAR | Status: AC
  Administered 2016-10-01: 2 mL

## 2016-10-01 NOTE — Patient Instructions (Signed)

## 2016-10-01 NOTE — Progress Notes (Deleted)
Left side low back pain radiating down leg to foot. Not constant. Pain gets worse with increased activity and lifting. Occasional foot numbness.

## 2016-10-01 NOTE — Procedures (Signed)
Lumbosacral Transforaminal Epidural Steroid Injection - Infraneural Approach with Fluoroscopic Guidance  Patient: Caroline Keller      Date of Birth: [birth year] MRN: [id number] PCP: Jarrett SohoWharton, Courtney, PA-C      Visit Date: 10/01/2016   Caroline Keller is a 56 year old female followed by Dr. Magnus IvanBlackman who I have seen in the remote past for lumbar spine issues. 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 Either one of these can be looking at an L5 nerve root or S1 nerve root. Her symptoms are classic L5 with some S1 distribution. She has failed conservative care otherwise. This is a diagnostic and hopefully therapeutic left L5 and S1 transforaminal epidural steroid injection. She has not had any focal weakness and there is no weakness on exam.  Universal Protocol:     Consent Given By: the patient  Position: PRONE   Additional Comments: Vital signs were monitored before and after the procedure. Patient was prepped and draped in the usual sterile fashion. The correct patient, procedure, and site was verified.   Injection Procedure Details:  Procedure Site One Meds Administered:  Meds ordered this encounter  Medications  . lidocaine (PF) (XYLOCAINE) 1 % injection 2 mL  . methylPREDNISolone acetate (DEPO-MEDROL) injection 80 mg      Laterality: Left  Location/Site:  L5-S1 S1-2  Needle size: 22 G  Needle type: Spinal  Needle Placement: Transforaminal  Findings:  -Contrast Used: 1 mL iohexol 180 mg iodine/mL   -Comments: Excellent flow of contrast along the nerve and into the epidural space.  Procedure Details: After squaring off the end-plates of the desired vertebral level to get a true AP view, the C-arm was obliqued to the painful side so that the superior articulating process is positioned about 1/3 the length of the inferior endplate.  The needle was aimed toward the  junction of the superior articular process and the transverse process of the inferior vertebrae. The needle's initial entry is in the lower third of the foramen through Kambin's triangle. The soft tissues overlying this target were infiltrated with 2-3 ml. of 1% Lidocaine without Epinephrine.  The spinal needle was then inserted and advanced toward the target using a "trajectory" view along the fluoroscope beam.  Under AP and lateral visualization, the needle was advanced so it did not puncture dura and did not traverse medially beyond the 6 o'clock position of the pedicle. Bi-planar projections were used to confirm position. Aspiration was confirmed to be negative for CSF and/or blood. A 1-2 ml. volume of Isovue-250 was injected and flow of contrast was noted at each level. Radiographs were obtained for documentation purposes.   After attaining the desired flow of contrast documented above, a 0.5 to 1.0 ml test dose of 0.25% Marcaine was injected into each respective transforaminal space.  The patient was observed for 90 seconds post injection.  After no sensory deficits were reported, and normal lower extremity motor function was noted,   the above injectate was administered so that equal amounts of the injectate were placed at each foramen (level) into the transforaminal epidural space.   Additional Comments:  The patient tolerated the procedure well Dressing: Band-Aid    Post-procedure details: Patient was observed during the procedure. Post-procedure instructions were reviewed.  Patient left the clinic in stable condition.

## 2016-10-15 ENCOUNTER — Ambulatory Visit (INDEPENDENT_AMBULATORY_CARE_PROVIDER_SITE_OTHER): Payer: BLUE CROSS/BLUE SHIELD | Admitting: Orthopaedic Surgery

## 2016-12-13 ENCOUNTER — Telehealth (INDEPENDENT_AMBULATORY_CARE_PROVIDER_SITE_OTHER): Payer: Self-pay

## 2016-12-13 ENCOUNTER — Other Ambulatory Visit (INDEPENDENT_AMBULATORY_CARE_PROVIDER_SITE_OTHER): Payer: Self-pay | Admitting: Orthopaedic Surgery

## 2016-12-13 MED ORDER — OXYCODONE-ACETAMINOPHEN 5-325 MG PO TABS: 1.0000 | ORAL_TABLET | Freq: Two times a day (BID) | ORAL | 0 refills | Status: DC | PRN

## 2016-12-13 NOTE — Telephone Encounter (Signed)
Patient would like a Rx refill for Percocet.  Cb# is [phone].  Please advise.  Thank You.

## 2016-12-13 NOTE — Telephone Encounter (Signed)
Please advise 

## 2016-12-13 NOTE — Telephone Encounter (Signed)
She can come and pick up her prescription

## 2016-12-13 NOTE — Telephone Encounter (Signed)
LMOM for patient that Rx ready at front desk  

## 2017-01-14 ENCOUNTER — Telehealth (INDEPENDENT_AMBULATORY_CARE_PROVIDER_SITE_OTHER): Payer: Self-pay | Admitting: Radiology

## 2017-01-14 NOTE — Telephone Encounter (Signed)
Please advise 

## 2017-01-14 NOTE — Telephone Encounter (Signed)
Patient called and requests another Rx oxycodone.  Please call her to advise when ready.

## 2017-01-15 ENCOUNTER — Other Ambulatory Visit (INDEPENDENT_AMBULATORY_CARE_PROVIDER_SITE_OTHER): Payer: Self-pay | Admitting: Orthopaedic Surgery

## 2017-01-15 MED ORDER — OXYCODONE-ACETAMINOPHEN 5-325 MG PO TABS: 1.0000 | ORAL_TABLET | Freq: Two times a day (BID) | ORAL | 0 refills | Status: DC | PRN

## 2017-01-15 NOTE — Telephone Encounter (Signed)
Patient aware of the below message  

## 2017-01-15 NOTE — Telephone Encounter (Signed)
She can come and pick up one more prescription but let her know I have to limit the amount due to the narcotics restrictions and laws that are out there

## 2017-02-08 ENCOUNTER — Other Ambulatory Visit: Payer: Self-pay | Admitting: Cardiology

## 2017-02-25 ENCOUNTER — Ambulatory Visit: Payer: PRIVATE HEALTH INSURANCE | Admitting: Physician Assistant

## 2017-02-25 ENCOUNTER — Encounter: Payer: Self-pay | Admitting: Physician Assistant

## 2017-02-25 VITALS — BP 126/92 | HR 83 | Ht 65.0 in | Wt 196.5 lb

## 2017-02-25 DIAGNOSIS — I493 Ventricular premature depolarization: Secondary | ICD-10-CM

## 2017-02-25 DIAGNOSIS — I491 Atrial premature depolarization: Secondary | ICD-10-CM

## 2017-02-25 DIAGNOSIS — E782 Mixed hyperlipidemia: Secondary | ICD-10-CM | POA: Diagnosis not present

## 2017-02-25 DIAGNOSIS — I10 Essential (primary) hypertension: Secondary | ICD-10-CM

## 2017-02-25 DIAGNOSIS — E785 Hyperlipidemia, unspecified: Secondary | ICD-10-CM | POA: Insufficient documentation

## 2017-02-25 MED ORDER — DILTIAZEM HCL ER COATED BEADS 360 MG PO CP24: 360.0000 mg | ORAL_CAPSULE | ORAL | 3 refills | Status: DC

## 2017-02-25 MED ORDER — TELMISARTAN-HCTZ 80-12.5 MG PO TABS: ORAL_TABLET | ORAL | 11 refills | Status: DC

## 2017-02-25 NOTE — Patient Instructions (Signed)
Medication Instructions: Your physician recommends that you continue on your current medications as directed. Please refer to the Current Medication list given to you today.  Labwork: None Ordered  Procedures/Testing: None Ordered  Follow-Up: Your physician wants you to follow-up in: 1 YEAR with Dr. Turner. You will receive a reminder letter in the mail two months in advance. If you don't receive a letter, please call our office to schedule the follow-up appointment.   If you need a refill on your cardiac medications before your next appointment, please call your pharmacy.   

## 2017-02-25 NOTE — Progress Notes (Signed)
Cardiology Office Note    Date:  02/25/2017   ID:  Caroline Keller, DOB [birth year], MRN [id number]  PCP:  Jarrett SohoWharton, Courtney, PA-C  Cardiologist: Dr. Armanda Magicraci Turner  Chief Complaint  Patient presents with  . Follow-up    History of Present Illness:  Caroline RifeJana Keller is a 56 y.o. female with history of hypertension, PACs, PVCs, hepatitis C and GERD.  Last saw Dr. Mayford Knifeurner 02/2016 and only had palpitations with stress or anxiety.  Well controlled on calcium channel blocker.  Patient comes in today for yearly follow-up.  She has occasional palpitations but they do not bother her anymore.  No fast heart rates.  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  Denies any chest pain, palpitations, dyspnea, dyspnea on exertion, dizziness or presyncope.  Her cholesterol was 252 LDL 175 in June and she was placed on red yeast by primary care.  She is supposed to have it rechecked next month.    Past Medical History:  Diagnosis Date  . Anxiety   . Arthritis of low back (HCC)   . GERD (gastroesophageal reflux disease)   . Hepatitis C   . History of C-section   . History of cholecystectomy   . History of hiatal hernia   . Hypertension   . PAC (premature atrial contraction)   . Palpitations   . PVC (premature ventricular contraction)     Past Surgical History:  Procedure Laterality Date  . CARDIOVASCULAR STRESS TEST  01/04/2015  . CESAREAN SECTION    . CHOLECYSTECTOMY      Current Medications: Current Meds  Medication Sig  . calcium carbonate 1250 MG capsule Take 1,250 mg by mouth 2 (two) times daily with a meal.  . clonazePAM (KLONOPIN) 1 MG tablet Take 1 tablet at bedtime by mouth. AS NEEDED FOR SLEEP  . diltiazem (CARDIZEM CD) 360 MG 24 hr capsule Take 1 capsule (360 mg total) every morning by mouth.  . escitalopram (LEXAPRO) 20 MG tablet Take 20 mg by mouth daily.  Marland Kitchen. omeprazole (PRILOSEC) 40 MG capsule Take 1 capsule (40 mg total) by mouth 2 (two) times  daily before a meal.  . telmisartan-hydrochlorothiazide (MICARDIS HCT) 80-12.5 MG tablet TAKE 1 TABLET BY MOUTH DAILY. GENERIC EQUIVALENT FOR MICARDIS HCT  . [DISCONTINUED] diltiazem (CARDIZEM CD) 360 MG 24 hr capsule Take 1 capsule (360 mg total) by mouth every morning.  . [DISCONTINUED] telmisartan-hydrochlorothiazide (MICARDIS HCT) 80-12.5 MG tablet TAKE 1 TABLET BY MOUTH DAILY. GENERIC EQUIVALENT FOR MICARDIS HCT     Allergies:   Patient has no known allergies.   Social History   Socioeconomic History  . Marital status: Married    Spouse name: None  . Number of children: None  . Years of education: None  . Highest education level: None  Social Needs  . Financial resource strain: None  . Food insecurity - worry: None  . Food insecurity - inability: None  . Transportation needs - medical: None  . Transportation needs - non-medical: None  Occupational History  . None  Tobacco Use  . Smoking status: Former Smoker    Last attempt to quit: 04/16/1981    Years since quitting: 35.8  . Smokeless tobacco: Never Used  Substance and Sexual Activity  . Alcohol use: No  . Drug use: No  . Sexual activity: None  Other Topics Concern  . None  Social History Narrative  . None     Family History:  The patient's family  history includes Breast cancer in her sister; Coronary artery disease in her father; Lung cancer in her mother.   ROS:   Please see the history of present illness.    Review of Systems  Constitution: Positive for weight gain.  HENT: Negative.   Eyes: Negative.   Cardiovascular: Negative.   Respiratory: Negative.   Hematologic/Lymphatic: Negative.   Musculoskeletal: Negative.  Negative for joint pain.  Gastrointestinal: Negative.   Genitourinary: Negative.        Menopause  Neurological: Negative.    All other systems reviewed and are negative.   PHYSICAL EXAM:   VS:  BP (!) 126/92 Comment: has not taken meds, w/headache today  Pulse 83   Ht 5\' 5"  (1.651 m)    Wt 196 lb 8 oz (89.1 kg)   SpO2 95%   BMI 32.70 kg/m   Physical Exam  GEN: Well nourished, well developed, in no acute distress  Neck: no JVD, carotid bruits, or masses Cardiac:RRR; no murmurs, rubs, or gallops  Respiratory:  clear to auscultation bilaterally, normal work of breathing GI: soft, nontender, nondistended, + BS Ext: without cyanosis, clubbing, or edema, Good distal pulses bilaterally Neuro:  Alert and Oriented x 3 Psych: euthymic mood, full affect  Wt Readings from Last 3 Encounters:  02/25/17 196 lb 8 oz (89.1 kg)  02/24/16 181 lb (82.1 kg)  02/08/15 175 lb 1.9 oz (79.4 kg)      Studies/Labs Reviewed:   EKG:  EKG is ordered today.  The ekg ordered today demonstrates normal sinus rhythm with PACs  Recent Labs: No results found for requested labs within last 8760 hours.   Lipid Panel    Component Value Date/Time   CHOL 184 01/04/2015 0211   TRIG 34 01/04/2015 0211   HDL 55 01/04/2015 0211   CHOLHDL 3.3 01/04/2015 0211   VLDL 7 01/04/2015 0211   LDLCALC 122 (H) 01/04/2015 0211    Additional studies/ records that were reviewed today include:  Nuclear stress test 9/20/16IMPRESSION: 1. No reversible ischemia or infarction.   2. Normal left ventricular wall motion.   3. Left ventricular ejection fraction 59%   4. Low-risk stress test findings*.       ASSESSMENT:    1. Benign essential HTN   2. PAC (premature atrial contraction)   3. PVC's (premature ventricular contractions)   4. Mixed hyperlipidemia      PLAN:  In order of problems listed above:  Essential hypertension blood pressure up a little today but she does have a headache and did not sleep well.  She says it is always normal.  I have asked her to recheck this at home and call us if it remains elevated.  PAC controlled on diltiazem  PVCs controlled on diltiazem  Hyperlipidemia cholesterol 252 and LDL 175 being managed by primary care on red yeast.  She is having it repeated next  month.  I have asked her to send us a copy.    Medication Adjustments/Labs and Tests Ordered: Current medicines are reviewed at length with the patient today.  Concerns regarding medicines are outlined above.  Medication changes, Labs and Tests ordered today are listed in the Patient Instructions below. Patient Instructions  Medication Instructions: Your physician recommends that you continue on your current medications as directed. Please refer to the Current Medication list given to you today.  Labwork: None Ordered  Procedures/Testing: None Ordered  Follow-Up: Your physician wants you to follow-up in: 1 YEAR with Dr. Mayford Knifeurner. You will receive a  reminder letter in the mail two months in advance. If you don't receive a letter, please call our office to schedule the follow-up appointment.   If you need a refill on your cardiac medications before your next appointment, please call your pharmacy.      Elson Clan, PA-C  02/25/2017 12:27 PM    [facility] [address] [place], [place], [place]  [address] Phone: [phone]; Fax: [phone]

## 2017-03-23 ENCOUNTER — Other Ambulatory Visit: Payer: Self-pay | Admitting: Cardiology

## 2017-05-28 ENCOUNTER — Encounter (INDEPENDENT_AMBULATORY_CARE_PROVIDER_SITE_OTHER): Payer: Self-pay | Admitting: Orthopaedic Surgery

## 2017-05-28 ENCOUNTER — Ambulatory Visit (INDEPENDENT_AMBULATORY_CARE_PROVIDER_SITE_OTHER): Payer: PRIVATE HEALTH INSURANCE | Admitting: Orthopaedic Surgery

## 2017-05-28 ENCOUNTER — Ambulatory Visit (INDEPENDENT_AMBULATORY_CARE_PROVIDER_SITE_OTHER): Payer: PRIVATE HEALTH INSURANCE

## 2017-05-28 DIAGNOSIS — M542 Cervicalgia: Secondary | ICD-10-CM

## 2017-05-28 DIAGNOSIS — M65311 Trigger thumb, right thumb: Secondary | ICD-10-CM

## 2017-05-28 DIAGNOSIS — M65312 Trigger thumb, left thumb: Secondary | ICD-10-CM | POA: Diagnosis not present

## 2017-05-28 MED ORDER — LIDOCAINE HCL 1 % IJ SOLN
1.0000 mL | INTRAMUSCULAR | Status: AC | PRN
  Administered 2017-05-28: 1 mL

## 2017-05-28 MED ORDER — METHYLPREDNISOLONE ACETATE 40 MG/ML IJ SUSP
40.0000 mg | INTRAMUSCULAR | Status: AC | PRN
  Administered 2017-05-28: 40 mg

## 2017-05-28 MED ORDER — TIZANIDINE HCL 4 MG PO TABS: 4.0000 mg | ORAL_TABLET | Freq: Three times a day (TID) | ORAL | 0 refills | Status: DC | PRN

## 2017-05-28 MED ORDER — METHYLPREDNISOLONE 4 MG PO TABS
ORAL_TABLET | ORAL | 0 refills | Status: DC
Start: 2017-05-28 — End: 2018-03-11

## 2017-05-28 NOTE — Progress Notes (Signed)
Office Visit Note   Patient: Caroline Keller           Date of Birth: 18-Sep-1960           MRN: [id number] Visit Date: 05/28/2017              Requested by: Jarrett Soho, PA-C [address] [place], [place] [address] PCP: Jarrett Soho, PA-C   Assessment & Plan: Visit Diagnoses:  1. Neck pain   2. Trigger thumb, right thumb   3. Trigger thumb, left thumb     Plan: I am encouraged that her neck symptoms are improving however her x-rays show significant loss of her cervical lordosis and almost going into a kyphosis between the upper and mid cervical spine.  I would like to put her on 6-day steroid taper as well as muscle relaxers.  She will continue her diclofenac as well.  We did inject over the A1 pulley on both thumbs.  At this point she will follow-up as needed however if her neck pain does not resolve my next step would be physical therapy and potentially an MRI.  All questions concerns were answered and addressed.  Follow-Up Instructions: Return if symptoms worsen or fail to improve.   Orders:  Orders Placed This Encounter  Procedures  . Hand/UE Inj  . Hand/UE Inj  . XR Cervical Spine 2 or 3 views   Meds ordered this encounter  Medications  . methylPREDNISolone (MEDROL) 4 MG tablet    Sig: Medrol dose pack. Take as instructed    Dispense:  21 tablet    Refill:  0  . tiZANidine (ZANAFLEX) 4 MG tablet    Sig: Take 1 tablet (4 mg total) by mouth every 8 (eight) hours as needed for muscle spasms.    Dispense:  60 tablet    Refill:  0      Procedures: Hand/UE Inj: R thumb A1 for trigger finger on 05/28/2017 8:34 AM Medications: 1 mL lidocaine 1 %; 40 mg methylPREDNISolone acetate 40 MG/ML  Hand/UE Inj: L thumb A1 for trigger finger on 05/28/2017 8:35 AM Medications: 1 mL lidocaine 1 %; 40 mg methylPREDNISolone acetate 40 MG/ML      Clinical Data: No additional findings.   Subjective: Chief Complaint  Patient presents with  . Neck - Pain  The patient  comes in today with 2 different issues.  First issue is neck pain is been going on for about a week now after picking up her dog.  She felt like she may be pulled muscles in her neck and is gotten a little bit better on the right side of the left side is so and painful and radiates pain going down her left arm.  She denies any numbness and tingling in her hands.  She does have a history of bilateral trigger thumb and pain over the A1 pulley and she like to have injections in her thumb today.  She also wants further evaluation and treatment of her neck pain.  She denies any weakness in her hands.  She states it does feel just slightly better than it did last week when she made the appointment but is still painful in the neck.  HPI  Review of Systems She currently denies any headache, chest pain, shortness of breath, fever, chills, nausea, vomiting.  Objective: Vital Signs: There were no vitals taken for this visit.  Physical Exam She is alert and oriented x3 in no acute distress Ortho Exam Examination of the cervical spine  shows good range of motion overall but definitely pain in the paraspinal muscles on both sides of her neck.  She has excellent strength in the bilateral upper extremities today.  She does have pain over the A1 pulley of both thumbs but no active triggering Specialty Comments:  No specialty comments available.  Imaging: Xr Cervical Spine 2 Or 3 Views  Result Date: 05/28/2017 2 views of the cervical spine show well-maintained disc heights at a loss of cervical lordosis.    PMFS History: Patient Active Problem List   Diagnosis Date Noted  . Hyperlipidemia 02/25/2017  . Chronic left-sided low back pain with left-sided sciatica 09/13/2016  . Trigger thumb, left thumb 09/13/2016  . Trigger thumb, right thumb 09/13/2016  . Substernal chest pain 01/03/2015  . PVC's (premature ventricular contractions) 03/23/2014  . PAC (premature atrial contraction) 03/23/2014  . Benign  essential HTN 03/23/2014  . Hepatitis C 12/08/2013  . GERD (gastroesophageal reflux disease)    Past Medical History:  Diagnosis Date  . Anxiety   . Arthritis of low back (HCC)   . GERD (gastroesophageal reflux disease)   . Hepatitis C   . History of C-section   . History of cholecystectomy   . History of hiatal hernia   . Hypertension   . PAC (premature atrial contraction)   . Palpitations   . PVC (premature ventricular contraction)     Family History  Problem Relation Age of Onset  . Lung cancer Mother   . Coronary artery disease Father   . Breast cancer Sister     Past Surgical History:  Procedure Laterality Date  . CARDIOVASCULAR STRESS TEST  01/04/2015  . CESAREAN SECTION    . CHOLECYSTECTOMY     Social History   Occupational History  . Not on file  Tobacco Use  . Smoking status: Former Smoker    Last attempt to quit: 04/16/1981    Years since quitting: 36.1  . Smokeless tobacco: Never Used  Substance and Sexual Activity  . Alcohol use: No  . Drug use: No  . Sexual activity: Not on file

## 2017-12-27 ENCOUNTER — Ambulatory Visit (INDEPENDENT_AMBULATORY_CARE_PROVIDER_SITE_OTHER): Payer: PRIVATE HEALTH INSURANCE | Admitting: Family Medicine

## 2017-12-27 ENCOUNTER — Encounter (INDEPENDENT_AMBULATORY_CARE_PROVIDER_SITE_OTHER): Payer: Self-pay | Admitting: Family Medicine

## 2017-12-27 ENCOUNTER — Ambulatory Visit (INDEPENDENT_AMBULATORY_CARE_PROVIDER_SITE_OTHER): Payer: PRIVATE HEALTH INSURANCE

## 2017-12-27 DIAGNOSIS — M25562 Pain in left knee: Secondary | ICD-10-CM

## 2017-12-27 MED ORDER — OXYCODONE-ACETAMINOPHEN 5-325 MG PO TABS: 1.0000 | ORAL_TABLET | Freq: Every evening | ORAL | 0 refills | Status: DC | PRN

## 2017-12-27 NOTE — Progress Notes (Signed)
Office Visit Note   Patient: Genella RifeJana Ervine           Date of Birth: [birth year]           MRN: [id number] Visit Date: 12/27/2017 Requested by: Jarrett SohoWharton, Courtney, PA-C [address] [place], [place] [address] PCP: Jarrett SohoWharton, Courtney, PA-C  Subjective: Chief Complaint  Patient presents with  . Left Knee - Pain    Got "tangled up" with her new puppy and fell on left knee 2 weeks ago.    HPI: She is a 57 year old with left knee pain.  About 2 weeks ago she was walking her dog, tripped and fell landing on the anterior lateral aspect of the leg.  Able to get up and walk, did have an abrasion on the knee which has healed, but now she is having intermittent pain which gets worse after being on her feet.              ROS: Otherwise noncontributory  Objective: Vital Signs: There were no vitals taken for this visit.  Physical Exam:  Left knee: Trace effusion, no warmth or erythema.  Ligaments are stable.  No joint line tenderness.  Very tender at the proximal fibula.  Imaging: 3 view x-rays left knee: She has a nondisplaced fracture of the neck of the proximal fibula.  Minimal arthritic change in her knee.  No other abnormality seen.  Assessment & Plan: 1.  2 weeks status post left knee contusion with nondisplaced fracture proximal fibula -Pain medicine as needed, weightbearing as tolerated.  Out of work for 1 week.  Anticipate 3 to 4 weeks healing time.  If pain goes away she will follow-up as needed but if pain persists, she will come back in for another x-ray.   Follow-Up Instructions: No follow-ups on file.       Procedures: None today.   PMFS History: Patient Active Problem List   Diagnosis Date Noted  . Hyperlipidemia 02/25/2017  . Chronic left-sided low back pain with left-sided sciatica 09/13/2016  . Trigger thumb, left thumb 09/13/2016  . Trigger thumb, right thumb 09/13/2016  . Substernal chest pain 01/03/2015  . PVC's (premature ventricular contractions) 03/23/2014    . PAC (premature atrial contraction) 03/23/2014  . Benign essential HTN 03/23/2014  . Hepatitis C 12/08/2013  . GERD (gastroesophageal reflux disease)    Past Medical History:  Diagnosis Date  . Anxiety   . Arthritis of low back (HCC)   . GERD (gastroesophageal reflux disease)   . Hepatitis C   . History of C-section   . History of cholecystectomy   . History of hiatal hernia   . Hypertension   . PAC (premature atrial contraction)   . Palpitations   . PVC (premature ventricular contraction)     Family History  Problem Relation Age of Onset  . Lung cancer Mother   . Coronary artery disease Father   . Breast cancer Sister     Past Surgical History:  Procedure Laterality Date  . CARDIOVASCULAR STRESS TEST  01/04/2015  . CESAREAN SECTION    . CHOLECYSTECTOMY     Social History   Occupational History  . Not on file  Tobacco Use  . Smoking status: Former Smoker    Last attempt to quit: 04/16/1981    Years since quitting: 36.7  . Smokeless tobacco: Never Used  Substance and Sexual Activity  . Alcohol use: No  . Drug use: No  . Sexual activity: Not on file

## 2018-01-23 ENCOUNTER — Other Ambulatory Visit: Payer: Self-pay | Admitting: Physician Assistant

## 2018-01-28 ENCOUNTER — Ambulatory Visit (INDEPENDENT_AMBULATORY_CARE_PROVIDER_SITE_OTHER): Payer: PRIVATE HEALTH INSURANCE | Admitting: Family Medicine

## 2018-01-28 ENCOUNTER — Telehealth (INDEPENDENT_AMBULATORY_CARE_PROVIDER_SITE_OTHER): Payer: Self-pay

## 2018-01-28 ENCOUNTER — Ambulatory Visit (HOSPITAL_BASED_OUTPATIENT_CLINIC_OR_DEPARTMENT_OTHER)
Admission: RE | Admit: 2018-01-28 | Discharge: 2018-01-28 | Disposition: A | Discharge status: Home / Self Care | Payer: PRIVATE HEALTH INSURANCE | Source: Ambulatory Visit | Attending: Family Medicine | Admitting: Family Medicine

## 2018-01-28 ENCOUNTER — Encounter (INDEPENDENT_AMBULATORY_CARE_PROVIDER_SITE_OTHER): Payer: Self-pay | Admitting: Family Medicine

## 2018-01-28 DIAGNOSIS — M7989 Other specified soft tissue disorders: Secondary | ICD-10-CM

## 2018-01-28 DIAGNOSIS — M25561 Pain in right knee: Secondary | ICD-10-CM

## 2018-01-28 DIAGNOSIS — M25562 Pain in left knee: Secondary | ICD-10-CM | POA: Diagnosis not present

## 2018-01-28 LAB — D-DIMER, QUANTITATIVE: D-Dimer, Quant: 0.9 mcg/mL FEU — ABNORMAL HIGH (ref <0.50)

## 2018-01-28 NOTE — Progress Notes (Signed)
Office Visit Note   Patient: Caroline Keller           Date of Birth: 05/17/1960           MRN: [id number] Visit Date: 01/28/2018 Requested by: Jarrett Soho, PA-C [address] [place], [place] [address] PCP: Jarrett Soho, PA-C  Subjective: Chief Complaint  Patient presents with  . Right Knee - Pain    Pain x 2 weeks.  Swelling in whole leg.  Popping sensation anterior knee (feels like a "grabbing spasm").    HPI: She is here with right leg pain.  Symptoms started about 2 weeks ago, she thinks from favoring her left knee proximal fibula fracture.  Popping and pain in the knee, but then swelling in the thigh and the calf.  She has noticed a significant difference in size of her leg compared to the left.  No history of DVT.  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  She is not on any new medications.  She takes ibuprofen only occasionally.              ROS: No family history of DVT.  Objective: Vital Signs: There were no vitals taken for this visit.  Physical Exam:  Right leg: She does have asymmetry of her thigh and calf compared to the left side.  Trace knee effusion, minimal joint line tenderness.  No pain with patellar compression.  Negative Homans sign.  Imaging: None today.  Assessment & Plan: 1.  Right leg pain and swelling, suspect due to muscular hypertrophy from altered gait.  Cannot rule out DVT although I think it would be unlikely. -We will order a d-dimer.  If negative, then reassurance.  Could refer to physical therapy if symptoms persist.   Follow-Up Instructions: No follow-ups on file.       Procedures: None   PMFS History: Patient Active Problem List   Diagnosis Date Noted  . Hyperlipidemia 02/25/2017  . Chronic left-sided low back pain with left-sided sciatica 09/13/2016  . Trigger thumb, left thumb 09/13/2016  . Trigger thumb, right thumb 09/13/2016  . Substernal chest pain 01/03/2015  . PVC's (premature ventricular  contractions) 03/23/2014  . PAC (premature atrial contraction) 03/23/2014  . Benign essential HTN 03/23/2014  . Hepatitis C 12/08/2013  . GERD (gastroesophageal reflux disease)    Past Medical History:  Diagnosis Date  . Anxiety   . Arthritis of low back   . GERD (gastroesophageal reflux disease)   . Hepatitis C   . History of C-section   . History of cholecystectomy   . History of hiatal hernia   . Hypertension   . PAC (premature atrial contraction)   . Palpitations   . PVC (premature ventricular contraction)     Family History  Problem Relation Age of Onset  . Lung cancer Mother   . Coronary artery disease Father   . Breast cancer Sister     Past Surgical History:  Procedure Laterality Date  . CARDIOVASCULAR STRESS TEST  01/04/2015  . CESAREAN SECTION    . CHOLECYSTECTOMY     Social History   Occupational History  . Not on file  Tobacco Use  . Smoking status: Former Smoker    Last attempt to quit: 04/16/1981    Years since quitting: 36.8  . Smokeless tobacco: Never Used  Substance and Sexual Activity  . Alcohol use: No  . Drug use: No  . Sexual activity: Not on file

## 2018-01-28 NOTE — Telephone Encounter (Signed)
Spoke with patient, giving her the results of the D-Dimer from earlier today. Per Dr. Prince Rome: the number was elevated but it does not mean she has a blood clot. It does however make the possibility of a blood clot slightly higher.  Because of this, we need to proceed with a venous doppler of the right leg to rule out DVT.  She has an appointment this evening at 6:30 at the [facility] in [facility]. The facility will call Dr. Prince Rome afterward with results.

## 2018-01-28 NOTE — Telephone Encounter (Signed)
I called patient and left a voice mail for her to return my call regarding her labwork results from earlier today.

## 2018-01-28 NOTE — Addendum Note (Signed)
Addended by: Rip Harbour on: 01/28/2018 04:05 PM   Modules accepted: Orders

## 2018-01-29 ENCOUNTER — Telehealth (INDEPENDENT_AMBULATORY_CARE_PROVIDER_SITE_OTHER): Payer: Self-pay | Admitting: Family Medicine

## 2018-01-29 NOTE — Telephone Encounter (Signed)
Patient advised.

## 2018-01-29 NOTE — Telephone Encounter (Signed)
Ultrasound did not show a blood clot.  Instead, she most likely has a ruptured Bakers cyst.  This typically occurs from swelling inside the knee joint, most likely from arthritis, due to favoring the other leg.  The swelling probably became too much and a cyst formed and then ruptured.  It will subside on its own.

## 2018-03-03 ENCOUNTER — Encounter (INDEPENDENT_AMBULATORY_CARE_PROVIDER_SITE_OTHER): Payer: Self-pay | Admitting: Orthopaedic Surgery

## 2018-03-03 ENCOUNTER — Ambulatory Visit (INDEPENDENT_AMBULATORY_CARE_PROVIDER_SITE_OTHER): Payer: PRIVATE HEALTH INSURANCE | Admitting: Orthopaedic Surgery

## 2018-03-03 DIAGNOSIS — M5441 Lumbago with sciatica, right side: Secondary | ICD-10-CM

## 2018-03-03 DIAGNOSIS — M25561 Pain in right knee: Secondary | ICD-10-CM

## 2018-03-03 MED ORDER — OXYCODONE-ACETAMINOPHEN 5-325 MG PO TABS: 1.0000 | ORAL_TABLET | Freq: Four times a day (QID) | ORAL | 0 refills | Status: DC | PRN

## 2018-03-03 MED ORDER — NABUMETONE 500 MG PO TABS: 500.0000 mg | ORAL_TABLET | Freq: Two times a day (BID) | ORAL | 0 refills | Status: DC | PRN

## 2018-03-03 NOTE — Progress Notes (Signed)
The patient is a 57 year old who actually seen Dr. Prince RomeHilts last few visits.  I seen her remotely in the past.  This is been for chronic back issues.  There is always been low back pain with left-sided sciatica but right now is been low back pain with right-sided sciatica.  Her previous MRI showed no issues with the spine on the right side and this was last done in 2018.  Now she is having radicular symptoms with sciatica going down the backside on the right side all the way down her foot and ankle.  She is had some swelling and throughout that right leg and we actually sent her for Doppler study rule out DVT due to swelling.  She has a lot of pain in the right knee and swelling in the back of her knee and pain in the back of her knee.  The ultrasound did show a Baker's cyst on the back of her right knee.  Examination today she has a positive straight leg raise of the right side.  She has right knee and leg swelling.  She has pain past 90 degrees of flexion in the back of her right knee.  At this point an MRI of the lumbar spine is warranted as well as of the right knee is warranted given these 2 different issues that she is having.  We need these MRIs at this point to help guide our medical treatment further.  I will try just a few days of Percocet and also Relafen as an anti-inflammatory.  We will see her back once the MRIs have been done.  All question concerns were answered and addressed.

## 2018-03-04 ENCOUNTER — Other Ambulatory Visit (INDEPENDENT_AMBULATORY_CARE_PROVIDER_SITE_OTHER): Payer: Self-pay

## 2018-03-04 DIAGNOSIS — M4807 Spinal stenosis, lumbosacral region: Secondary | ICD-10-CM

## 2018-03-04 DIAGNOSIS — G8929 Other chronic pain: Secondary | ICD-10-CM

## 2018-03-04 DIAGNOSIS — M25561 Pain in right knee: Principal | ICD-10-CM

## 2018-03-10 ENCOUNTER — Telehealth (INDEPENDENT_AMBULATORY_CARE_PROVIDER_SITE_OTHER): Payer: Self-pay | Admitting: Orthopaedic Surgery

## 2018-03-10 NOTE — Progress Notes (Signed)
Cardiology Office Note   Date:  03/11/2018   ID:  Caroline Keller, DOB 1960/09/05, MRN [id number]  PCP:  Caroline Soho, PA-C  Cardiologist: Dr. Armanda Magic, MD    Chief Complaint  Patient presents with  . Follow-up   History of Present Illness: Caroline Keller is a 57 y.o. female who presents for yearly follow up for HTN and PVC/PAC's, seen for Dr.Turner. Caroline Keller has a hx of hypertension, PACs, PVCs, hepatitis C and GERD. She was last seen by Dr. Mayford Keller 02/2016 and had c/o palpitations with stress or anxiety which was noted to be well controlled on calcium channel blocker. She was then seen for her yearly follow up 02/25/17 with c/o occasional palpitations however were noted to not be bothersome. She works two job, Wellsite geologist and bartending and walks her dog approximately 30 minutes daily.   Today she is here for her yearly follow up appointment and is doing well however she reports that her BP has been running high for the last 4 weeks. She states that she began having more frequent headaches and noticed mild hand swelling. When she took her BP at home it was elevated with SBP's in the 140-150's. She states that she typically runs in the 110-120 systolic range. She denies diet indiscretions, increased stress or more frequent caffeine intake. She was treated approximately 2 months ago with a short course of prednisone, however she has not taken Medrol in at least 6 weeks. She denies chest pain, palpitations, SOB, PND, dizziness or syncope. Her LDL was noted to be elevated at her last lipid check with her PCP in 11/2017 with an LDL of 143. Overall she is well but is concerned about her BP.   Past Medical History:  Diagnosis Date  . Anxiety   . Arthritis of low back   . GERD (gastroesophageal reflux disease)   . Hepatitis C   . History of C-section   . History of cholecystectomy   . History of hiatal hernia   . Hypertension   . PAC (premature atrial contraction)   . Palpitations   . PVC  (premature ventricular contraction)     Past Surgical History:  Procedure Laterality Date  . CARDIOVASCULAR STRESS TEST  01/04/2015  . CESAREAN SECTION    . CHOLECYSTECTOMY      Current Outpatient Medications  Medication Sig Dispense Refill  . calcium carbonate 1250 MG capsule Take 1,250 mg by mouth 2 (two) times daily with a meal.    . clonazePAM (KLONOPIN) 1 MG tablet Take 1 tablet at bedtime by mouth. AS NEEDED FOR SLEEP  0  . diltiazem (CARDIZEM CD) 360 MG 24 hr capsule TAKE 1 CAPSULE BY MOUTH EVERY MORNING 90 capsule 0  . escitalopram (LEXAPRO) 20 MG tablet Take 20 mg by mouth daily.  1  . nabumetone (RELAFEN) 500 MG tablet Take 1 tablet (500 mg total) by mouth 2 (two) times daily as needed. 60 tablet 0  . omeprazole (PRILOSEC) 20 MG capsule Take 20 mg by mouth 2 (two) times daily.  10  . oxyCODONE-acetaminophen (PERCOCET/ROXICET) 5-325 MG tablet Take 1 tablet by mouth every 6 (six) hours as needed for severe pain. 20 tablet 0  . tiZANidine (ZANAFLEX) 4 MG tablet Take 1 tablet (4 mg total) by mouth every 8 (eight) hours as needed for muscle spasms. 60 tablet 0  . atorvastatin (LIPITOR) 20 MG tablet Take 1 tablet (20 mg total) by mouth daily. 90 tablet 3  . telmisartan-hydrochlorothiazide (MICARDIS HCT) 80-25  MG tablet Take 1 tablet by mouth daily. 90 tablet 3   No current facility-administered medications for this visit.     Allergies:   Patient has no known allergies.    Social History:  The patient  reports that she quit smoking about 36 years ago. She has never used smokeless tobacco. She reports that she does not drink alcohol or use drugs.   Family History:  The patient's family history includes Breast cancer in her sister; Coronary artery disease in her father; Lung cancer in her mother.   ROS:  Please see the history of present illness. Otherwise, review of systems are positive for none. All other systems are reviewed and negative.   PHYSICAL EXAM: VS:  BP (!) 152/100    Pulse 80   Ht 5\' 5"  (1.651 m)   Wt 206 lb (93.4 kg)   BMI 34.28 kg/m  , BMI Body mass index is 34.28 kg/m.   General: Well developed, well nourished, NAD Skin: Warm, dry, intact  Head: Normocephalic, atraumatic, clear, moist mucus membranes. Neck: Negative for carotid bruits. No JVD Lungs:Clear to ausculation bilaterally. No wheezes, rales, or rhonchi. Breathing is unlabored. Cardiovascular: RRR with S1 S2. No murmurs, rubs, gallops, or LV heave appreciated. MSK: Strength and tone appear normal for age. 5/5 in all extremities Extremities: No edema. No clubbing or cyanosis. DP/PT pulses 2+ bilaterally Neuro: Alert and oriented. No focal deficits. No facial asymmetry. MAE spontaneously. Psych: Responds to questions appropriately with normal affect.    EKG:  EKG is ordered today. The ekg ordered today demonstrates NSR, no PVC's or PAC's   Recent Labs: No results found for requested labs within last 8760 hours.   Lipid Panel    Component Value Date/Time   CHOL 184 01/04/2015 0211   TRIG 34 01/04/2015 0211   HDL 55 01/04/2015 0211   CHOLHDL 3.3 01/04/2015 0211   VLDL 7 01/04/2015 0211   LDLCALC 122 (H) 01/04/2015 0211    Wt Readings from Last 3 Encounters:  03/11/18 206 lb (93.4 kg)  02/25/17 196 lb 8 oz (89.1 kg)  02/24/16 181 lb (82.1 kg)    Other studies Reviewed: Additional studies/ records that were reviewed today include:   Nuclear stress test 01/04/15: IMPRESSION: 1. No reversible ischemia or infarction.  2. Normal left ventricular wall motion.  3. Left ventricular ejection fraction 59%  4. Low-risk stress test findings*.   ASSESSMENT AND PLAN:  1.  Essential HTN: -Elevated today at 152/100 with home reading reported with SBP consistently in the 140-150's. She has had more frequent headaches with bilateral mild hand swelling>>consistent to her prior symptoms of elevated BP. -Continue diltiazem 360mg  daily  -Will increase her Micardis to 80-25mg  PO  daily from 80-12.5mg  PO daily. Creatinine stable at 0.8 when checked on 12/06/2017. She is to keep a log of daily BP's from home and bring them to her follow up visit scheduled in approximately 4 weeks for BP/medication change follow up -If BP continues to be elevated, consider additional agent at that time -We have reviewed low sodium diet, stress management strategies   2. PAC's/PVC's: -Denies recent palpitations -Controlled on diltiazem  -EKG with NSR today  3. Mixed hyperlipidemia: -Cholesterol 252 and LDL 175>>managed by primary care>>reed yeast added last year without much change in cholesterol readings  -Last LDL reading noted to be 145 on 11/2017 -Will start low dose atorvastatin 20mg  today and will recheck lipid panel with LFT's at 4 week follow up appointment>>already scheduled  Current medicines are reviewed at length with the patient today.  The patient does not have concerns regarding medicines.  The following changes have been made: Increase Micardis from 80-12.5mg  to 80-25mg  and follow BP closely. Add atorvastatin 20mg  and follow lipid panel in 4 weeks with follow up BP appointment   Labs/ tests ordered today include: None   Orders Placed This Encounter  Procedures  . Lipid Profile  . Hepatic function panel  . EKG 12-Lead   Disposition:   FU with Dr. Mayford Keller or APP in 4 weeks  Signed, Georgie Chard, NP  03/11/2018 11:58 AM    [facility] [address] [place], [place], [place]  [address] Phone: [phone]; Fax: [phone]

## 2018-03-10 NOTE — Telephone Encounter (Signed)
See below

## 2018-03-10 NOTE — Telephone Encounter (Signed)
K[facility] called stating that the MRI order does not have Dr. Eliberto IvoryBlackman's signature on the order.  She is requesting that the order be faxed to her at [phone].  CB#[phone].  Thank you.

## 2018-03-11 ENCOUNTER — Encounter: Payer: Self-pay | Admitting: Cardiology

## 2018-03-11 ENCOUNTER — Ambulatory Visit: Payer: PRIVATE HEALTH INSURANCE | Admitting: Cardiology

## 2018-03-11 VITALS — BP 152/100 | HR 80 | Ht 65.0 in | Wt 206.0 lb

## 2018-03-11 DIAGNOSIS — E782 Mixed hyperlipidemia: Secondary | ICD-10-CM

## 2018-03-11 DIAGNOSIS — I493 Ventricular premature depolarization: Secondary | ICD-10-CM

## 2018-03-11 DIAGNOSIS — I491 Atrial premature depolarization: Secondary | ICD-10-CM | POA: Diagnosis not present

## 2018-03-11 DIAGNOSIS — I10 Essential (primary) hypertension: Secondary | ICD-10-CM

## 2018-03-11 MED ORDER — ATORVASTATIN CALCIUM 20 MG PO TABS: 20.0000 mg | ORAL_TABLET | Freq: Every day | ORAL | 3 refills | Status: DC

## 2018-03-11 MED ORDER — TELMISARTAN-HCTZ 80-25 MG PO TABS: 1.0000 | ORAL_TABLET | Freq: Every day | ORAL | 3 refills | Status: DC

## 2018-03-11 NOTE — Patient Instructions (Signed)
Medication Instructions:  START Micardis 80/25 START Atorvastatin (Lipitor) 20 mg daily  If you need a refill on your cardiac medications before your next appointment, please call your pharmacy.   Lab work: Schedule same day as Appt with Turner or APP Fasting Lipid Panel LFT  If you have labs (blood work) drawn today and your tests are completely normal, you will receive your results only by: Marland Kitchen. MyChart Message (if you have MyChart) OR . A paper copy in the mail If you have any lab test that is abnormal or we need to change your treatment, we will call you to review the results.  Testing/Procedures: NONE  Follow-Up: At [facility], you and your health needs are our priority.  As part of our continuing mission to provide you with exceptional heart care, we have created designated Provider Care Teams.  These Care Teams include your primary Cardiologist (physician) and Advanced Practice Providers (APPs -  Physician Assistants and Nurse Practitioners) who all work together to provide you with the care you need, when you need it. You will need a follow up appointment in 4 weeks.  Please call our office 2 months in advance to schedule this appointment.  You may see Dr Mayford Knifeurner or one of the following Advanced Practice Providers on your designated Care Team:   Robbie LisBrittainy Simmons, PA-C Ronie Spiesayna Dunn, PA-C . Jacolyn ReedyMichele Lenze, PA-C  Any Other Special Instructions Will Be Listed Below (If Applicable).  Monitor BP daily at home, keep record

## 2018-03-12 NOTE — Telephone Encounter (Signed)
Done

## 2018-03-19 ENCOUNTER — Ambulatory Visit (INDEPENDENT_AMBULATORY_CARE_PROVIDER_SITE_OTHER): Payer: PRIVATE HEALTH INSURANCE | Admitting: Orthopaedic Surgery

## 2018-03-19 ENCOUNTER — Telehealth (INDEPENDENT_AMBULATORY_CARE_PROVIDER_SITE_OTHER): Payer: Self-pay

## 2018-03-19 ENCOUNTER — Other Ambulatory Visit (INDEPENDENT_AMBULATORY_CARE_PROVIDER_SITE_OTHER): Payer: Self-pay

## 2018-03-19 ENCOUNTER — Encounter (INDEPENDENT_AMBULATORY_CARE_PROVIDER_SITE_OTHER): Payer: Self-pay | Admitting: Orthopaedic Surgery

## 2018-03-19 DIAGNOSIS — M25561 Pain in right knee: Secondary | ICD-10-CM | POA: Diagnosis not present

## 2018-03-19 DIAGNOSIS — M1711 Unilateral primary osteoarthritis, right knee: Secondary | ICD-10-CM

## 2018-03-19 DIAGNOSIS — G8929 Other chronic pain: Secondary | ICD-10-CM | POA: Diagnosis not present

## 2018-03-19 DIAGNOSIS — M4807 Spinal stenosis, lumbosacral region: Secondary | ICD-10-CM

## 2018-03-19 NOTE — Progress Notes (Signed)
The patient comes in today to go over an MRI of her lumbar spine and an MRI of her right knee.  The right knee with an acute flareup of pain with a moderate effusion of her knee.  I provided a steroid injection in the knee but it was not getting better.  She is not had any knee issues before and her x-rays showed a well-maintained joint space.  The knee still pops on her and is been painful with swelling.  She is been also having symptom medic sciatica going down her left side.  Remotely she had injections in her lumbar spine by Dr. Alvester MorinNewton but is been a long period of time.  Her x-rays were done in the [facility] system.  I only have the reports today.  On examination of her right knee still painful knee with patellofemoral crepitation but no significant effusion.  The MRI does show significant cartilage loss in the medial compartment of her knee as well as patellofemoral joint and there is moderate thinning laterally.  There is no meniscal tear changes in terms of fluid in the meniscus medially and laterally suggesting a chronic process.  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  I do feel that she would benefit from at L5-S1 injection to the left by Dr. Alvester MorinNewton.  The only other option would be sending her to a spine surgeon for evaluation.  She would like to at least try another injection since that helped in the past.  Also feel that she is a candidate for hyaluronic acid for her right knee and we will see if we can get that ordered through her insurance.  I will see her back myself in 4 weeks.  All questions concerns were answered and addressed.

## 2018-03-19 NOTE — Telephone Encounter (Signed)
Right knee injection

## 2018-03-20 NOTE — Telephone Encounter (Signed)
Noted  

## 2018-03-31 ENCOUNTER — Telehealth (INDEPENDENT_AMBULATORY_CARE_PROVIDER_SITE_OTHER): Payer: Self-pay

## 2018-03-31 NOTE — Telephone Encounter (Signed)
Submitted VOB for Monovisc, right knee. 

## 2018-04-01 ENCOUNTER — Ambulatory Visit (INDEPENDENT_AMBULATORY_CARE_PROVIDER_SITE_OTHER): Payer: PRIVATE HEALTH INSURANCE | Admitting: Orthopaedic Surgery

## 2018-04-02 ENCOUNTER — Telehealth (INDEPENDENT_AMBULATORY_CARE_PROVIDER_SITE_OTHER): Payer: Self-pay | Admitting: Orthopaedic Surgery

## 2018-04-02 NOTE — Telephone Encounter (Signed)
Noted. Will fax a Rx with providers signature.

## 2018-04-02 NOTE — Telephone Encounter (Signed)
Giving to you  

## 2018-04-02 NOTE — Telephone Encounter (Signed)
Robin from [facility] left a message stating that they need verbal approval to process the order for the patient's monovisc injection.  They can not accept printed faxed signature of the doctor.  If you fax the RX it must have the doctors signature on it.  CB#[phone].  Fax: [phone].  Thank you.

## 2018-04-03 ENCOUNTER — Telehealth (INDEPENDENT_AMBULATORY_CARE_PROVIDER_SITE_OTHER): Payer: Self-pay

## 2018-04-03 NOTE — Telephone Encounter (Signed)
PA required for Monovisc, right knee. Initiated PA with [employer], per Caleen EssexMaribel R., office notes need to be faxed to [phone]. PA pending, Reference# [id number]  Faxed office notes to [employer] at [phone], attention Christy.

## 2018-04-03 NOTE — Telephone Encounter (Signed)
Received a fax from [facility] stating that a Rx needs to be faxed with doctors signature. Faxed signed Rx to [facility] at [phone].

## 2018-04-04 ENCOUNTER — Other Ambulatory Visit: Payer: Self-pay | Admitting: Physician Assistant

## 2018-04-04 ENCOUNTER — Telehealth (INDEPENDENT_AMBULATORY_CARE_PROVIDER_SITE_OTHER): Payer: Self-pay

## 2018-04-04 NOTE — Telephone Encounter (Signed)
Submitted PA on [employer] for Monovisc right knee.

## 2018-04-08 ENCOUNTER — Other Ambulatory Visit: Payer: PRIVATE HEALTH INSURANCE

## 2018-04-08 ENCOUNTER — Ambulatory Visit: Payer: PRIVATE HEALTH INSURANCE | Admitting: Cardiology

## 2018-04-08 NOTE — Progress Notes (Deleted)
Cardiology Office Note:    Date:  04/08/2018   ID:  Caroline Keller, DOB February 09, 1961, MRN [id number]  PCP:  Jarrett SohoWharton, Courtney, PA-C  Cardiologist:  Armanda Magicraci Turner, MD  Referring MD: Jarrett SohoWharton, Courtney, PA-C   No chief complaint on file. ***  History of Present Illness:    Caroline RifeJana Keller is a 57 y.o. female with a past medical history significant for hypertension, PVCs/PACs, hypertension, hepatitis C and GERD.  Her PVCs/PACs are generally controlled with diltiazem 360 mg.  She was seen on 03/11/2018 for annual follow-up and was noted to have an elevated blood pressure.  Her Cardizem 360 mg was continued and Micardis was increased to 80-25 mg from 80-12.5 mg.  She was told to bring a log of blood pressures for follow-up in 4 weeks.  She was also noted to have an LDL of 145 and low-dose atorvastatin 20 mg was added with plan for fasting lipid panel today.  Caroline Keller is here today for blood pressure follow-up   Lipid panel  Past Medical History:  Diagnosis Date  . Anxiety   . Arthritis of low back   . GERD (gastroesophageal reflux disease)   . Hepatitis C   . History of C-section   . History of cholecystectomy   . History of hiatal hernia   . Hypertension   . PAC (premature atrial contraction)   . Palpitations   . PVC (premature ventricular contraction)     Past Surgical History:  Procedure Laterality Date  . CARDIOVASCULAR STRESS TEST  01/04/2015  . CESAREAN SECTION    . CHOLECYSTECTOMY      Current Medications: No outpatient medications have been marked as taking for the 04/08/18 encounter (Appointment) with Berton BonHammond, Luian Schumpert, NP.     Allergies:   Patient has no known allergies.   Social History   Socioeconomic History  . Marital status: Married    Spouse name: Not on file  . Number of children: Not on file  . Years of education: Not on file  . Highest education level: Not on file  Occupational History  . Not on file  Social Needs  . Financial resource strain: Not on  file  . Food insecurity:    Worry: Not on file    Inability: Not on file  . Transportation needs:    Medical: Not on file    Non-medical: Not on file  Tobacco Use  . Smoking status: Former Smoker    Last attempt to quit: 04/16/1981    Years since quitting: 37.0  . Smokeless tobacco: Never Used  Substance and Sexual Activity  . Alcohol use: No  . Drug use: No  . Sexual activity: Not on file  Lifestyle  . Physical activity:    Days per week: Not on file    Minutes per session: Not on file  . Stress: Not on file  Relationships  . Social connections:    Talks on phone: Not on file    Gets together: Not on file    Attends religious service: Not on file    Active member of club or organization: Not on file    Attends meetings of clubs or organizations: Not on file    Relationship status: Not on file  Other Topics Concern  . Not on file  Social History Narrative  . Not on file     Family History: The patient's family history includes Breast cancer in her sister; Coronary artery disease in her father; Lung cancer in her mother. ROS:  Please see the history of present illness.     All other systems reviewed and are negative.  EKGs/Labs/Other Studies Reviewed:    The following studies were reviewed today:  Nuclear stress test 01/04/15: IMPRESSION: 1. No reversible ischemia or infarction. 2. Normal left ventricular wall motion. 3. Left ventricular ejection fraction 59% 4. Low-risk stress test findings*.   EKG:  EKG is not ordered today.    Recent Labs: No results found for requested labs within last 8760 hours.   Recent Lipid Panel    Component Value Date/Time   CHOL 184 01/04/2015 0211   TRIG 34 01/04/2015 0211   HDL 55 01/04/2015 0211   CHOLHDL 3.3 01/04/2015 0211   VLDL 7 01/04/2015 0211   LDLCALC 122 (H) 01/04/2015 0211    Physical Exam:    VS:  There were no vitals taken for this visit.    Wt Readings from Last 3 Encounters:  03/11/18 206 lb (93.4  kg)  02/25/17 196 lb 8 oz (89.1 kg)  02/24/16 181 lb (82.1 kg)     Physical Exam***   ASSESSMENT:    1. Benign essential HTN   2. Mixed hyperlipidemia   3. PAC (premature atrial contraction)   4. PVC's (premature ventricular contractions)    PLAN:    In order of problems listed above:  Hypertension -Blood pressure elevated at last appointment in November.  Micardis was increased from 80-12.5 mg to 80-25 mg -Home BP log  Mixed hyperlipidemia -LDL noted to be 145 in 11/2017 while on red yeast rice. -Atorvastatin 20 mg daily has been added.  Will check fasting lipid panel today.  PACs/PVCs -Controlled on diltiazem   Medication Adjustments/Labs and Tests Ordered: Current medicines are reviewed at length with the patient today.  Concerns regarding medicines are outlined above. Labs and tests ordered and medication changes are outlined in the patient instructions below:  There are no Patient Instructions on file for this visit.   Signed, Berton BonJanine Alonte Wulff, NP  04/08/2018 5:10 AM    [facility]

## 2018-04-14 ENCOUNTER — Ambulatory Visit (INDEPENDENT_AMBULATORY_CARE_PROVIDER_SITE_OTHER): Payer: PRIVATE HEALTH INSURANCE | Admitting: Physical Medicine and Rehabilitation

## 2018-04-14 ENCOUNTER — Encounter (INDEPENDENT_AMBULATORY_CARE_PROVIDER_SITE_OTHER): Payer: Self-pay | Admitting: Physical Medicine and Rehabilitation

## 2018-04-14 ENCOUNTER — Ambulatory Visit (INDEPENDENT_AMBULATORY_CARE_PROVIDER_SITE_OTHER): Payer: Self-pay

## 2018-04-14 VITALS — BP 142/85 | HR 71 | Temp 98.2°F

## 2018-04-14 DIAGNOSIS — M5416 Radiculopathy, lumbar region: Secondary | ICD-10-CM | POA: Diagnosis not present

## 2018-04-14 DIAGNOSIS — M47816 Spondylosis without myelopathy or radiculopathy, lumbar region: Secondary | ICD-10-CM

## 2018-04-14 MED ORDER — BETAMETHASONE SOD PHOS & ACET 6 (3-3) MG/ML IJ SUSP
12.0000 mg | Freq: Once | INTRAMUSCULAR | Status: AC
  Administered 2018-04-14: 12 mg

## 2018-04-14 NOTE — Patient Instructions (Signed)

## 2018-04-14 NOTE — Progress Notes (Signed)
Numeric Pain Rating Scale and Functional Assessment Average Pain 9   In the last MONTH (on 0-10 scale) has pain interfered with the following?  1. General activity like being  able to carry out your everyday physical activities such as walking, climbing stairs, carrying groceries, or moving a chair?  Rating(7)   +Driver, -BT, -Dye Allergies. 

## 2018-04-14 NOTE — Progress Notes (Signed)
Caroline Keller - 57 y.o. female MRN [id number]  Date of birth: [birth year]  Office Visit Note: Visit Date: 04/14/2018 PCP: Jarrett SohoWharton, Courtney, PA-C Referred by: Jarrett SohoWharton, Courtney, PA-C  Subjective: Chief Complaint  Patient presents with  . Lower Back - Pain  . Right Leg - Pain  . Left Leg - Pain   HPI:  Caroline RifeJana Keller is a 57 y.o. female who comes in today For potential spine intervention procedure at the request of Dr. Doneen Poissonhristopher Blackman whom she follows for her orthopedic complaints.  We have seen her in the past for combination of epidural injection mostly.  She has findings on new MRI of this year of left-sided facet arthritis and foraminal narrowing at L5-S1 also with some lateral recess narrowing at L4-5 as well as facet arthropathy at L3-4.  She has no specific central canal stenosis or disc herniation.  Her pain is always a little bit out of proportion to how she appears in the office.  She rates her average pain is 9 out of 10.  Her biggest complaint is her back pain across the lower back worse with standing and worse with facet joint loading on exam today.  She gets some radiating pain into the right more than left leg which does not fit her MRI findings.  We really talked to her her back pain is the main issue.  She does use ibuprofen and anti-inflammatory.  Today we are going to complete diagnostic medial branch blocks at L3-4 and if she does well would complete second block and double block paradigm and look may be radiofrequency ablation.  ROS Otherwise per HPI.  Assessment & Plan: Visit Diagnoses:  1. Spondylosis without myelopathy or radiculopathy, lumbar region   2. Lumbar radiculopathy     Plan: No additional findings.   Meds & Orders:  Meds ordered this encounter  Medications  . betamethasone acetate-betamethasone sodium phosphate (CELESTONE) injection 12 mg    Orders Placed This Encounter  Procedures  . Facet Injection  . XR C-ARM NO REPORT    Follow-up: Return if  symptoms worsen or fail to improve.   Procedures: No procedures performed  Lumbar Diagnostic Facet Joint Nerve Block with Fluoroscopic Guidance   Patient: Caroline Keller      Date of Birth: [birth year] MRN: [id number] PCP: Jarrett SohoWharton, Courtney, PA-C      Visit Date: 04/14/2018   Universal Protocol:    Date/Time: 12/31/196:06 AM  Consent Given By: the patient  Position: PRONE  Additional Comments: Vital signs were monitored before and after the procedure. Patient was prepped and draped in the usual sterile fashion. The correct patient, procedure, and site was verified.   Injection Procedure Details:  Procedure Site One Meds Administered:  Meds ordered this encounter  Medications  . betamethasone acetate-betamethasone sodium phosphate (CELESTONE) injection 12 mg     Laterality: Bilateral  Location/Site:  L3-L4  Needle size: 22 ga.  Needle type:spinal  Needle Placement: Oblique pedical  Findings:   -Comments: There was excellent flow of contrast along the articular pillars without intravascular flow.  Procedure Details: The fluoroscope beam is vertically oriented in AP and then obliqued 15 to 20 degrees to the ipsilateral side of the desired nerve to achieve the "Scotty dog" appearance.  The skin over the target area of the junction of the superior articulating process and the transverse process (sacral ala if blocking the L5 dorsal rami) was locally anesthetized with a 1 ml volume of 1% Lidocaine without Epinephrine.  The spinal needle was  inserted and advanced in a trajectory view down to the target.   After contact with periosteum and negative aspirate for blood and CSF, correct placement without intravascular or epidural spread was confirmed by injecting 0.5 ml. of Isovue-250.  A spot radiograph was obtained of this image.    Next, a 0.5 ml. volume of the injectate described above was injected. The needle was then redirected to the other facet joint nerves mentioned above  if needed.  Prior to the procedure, the patient was given a Pain Diary which was completed for baseline measurements.  After the procedure, the patient rated their pain every 30 minutes and will continue rating at this frequency for a total of 5 hours.  The patient has been asked to complete the Diary and return to us by mail, fax or hand delivered as soon as possible.   Additional Comments:  The patient tolerated the procedure well Dressing: Band-Aid    Post-procedure details: Patient was observed during the procedure. Post-procedure instructions were reviewed.  Patient left the clinic in stable condition.   Clinical History: FINDINGS:  Bones: No suspicious osseous lesions. No acute fracture.  DISC LEVELS:  T12-L1: No significant central or foraminal stenosis.  L1-L2: Mild degenerative disc disease. Mild disc bulge. Small left foraminal disc protrusion. No significant central or foraminal stenosis.   L2-L3: Mild degenerative disc disease. Uncovered/bulging disc extending into bilateral foramen. Mild facet degenerative changes. Trace left facet effusion. No significant central or foraminal stenosis.    L3-L4: Mild degenerative disc disease. Mild facet degenerative changes/ligamentum flavum thickening. Small bilateral facet effusions. Uncovered/diffusely bulging disc extending into the foramen. Mild bilateral foraminal stenosis. Mild central stenosis.  L4-L5: Mild degenerative disc disease. Mild diffuse disc bulge. Mild facet degenerative changes/ligamentum flavum thickening.  Mild bilateral foraminal stenosis. Mild central stenosis.  L5-S1: Mild degenerative disc disease. Mild diffuse disc bulge and endplate spurring extending into bilateral foramen. Broad left posterolateral disc protrusion. Mild/moderate left foraminal stenosis. No right foraminal stenosis. No central stenosis.   SPINAL CORD: No abnormal signal is demonstrated within the visualized distal spinal cord. Cauda  equina appears unremarkable. Conus medullaris terminates at L1.   PARASPINAL TISSUES: Intact.  VISUALIZED SACRUM AND LOWER THORACIC SPINE:  No significant abnormality.  INCIDENTAL: T2 hyperintense focus in the anterior right kidney is incompletely imaged/characterized but may relate to cyst.  Small fat-containing umbilical hernia.   IMPRESSION: 1.  Multilevel degenerative changes of the lumbar spine, as detailed above. 2.  Foraminal stenosis is most pronounced and mild/moderate on the left at L5-S1 as result of a left posterolateral disc protrusion and endplate spurring. 3.  Mild central stenosis at L3-L4 and L4-L5.  Electronically Signed by: Maurene CapesJared J Meyer     Objective:  VS:  HT:    WT:   BMI:     BP:(!) 142/85  HR:71bpm  TEMP:98.2 F (36.8 C)(Oral)  RESP:  Physical Exam  Ortho Exam Imaging: Xr C-arm No Report  Result Date: 04/14/2018 Please see Notes tab for imaging impression.

## 2018-04-15 NOTE — Procedures (Signed)
Lumbar Diagnostic Facet Joint Nerve Block with Fluoroscopic Guidance   Patient: Genella RifeJana Furber      Date of Birth: 05-06-1960 MRN: [id number] PCP: Jarrett SohoWharton, Courtney, PA-C      Visit Date: 04/14/2018   Universal Protocol:    Date/Time: 12/31/196:06 AM  Consent Given By: the patient  Position: PRONE  Additional Comments: Vital signs were monitored before and after the procedure. Patient was prepped and draped in the usual sterile fashion. The correct patient, procedure, and site was verified.   Injection Procedure Details:  Procedure Site One Meds Administered:  Meds ordered this encounter  Medications  . betamethasone acetate-betamethasone sodium phosphate (CELESTONE) injection 12 mg     Laterality: Bilateral  Location/Site:  L3-L4  Needle size: 22 ga.  Needle type:spinal  Needle Placement: Oblique pedical  Findings:   -Comments: There was excellent flow of contrast along the articular pillars without intravascular flow.  Procedure Details: The fluoroscope beam is vertically oriented in AP and then obliqued 15 to 20 degrees to the ipsilateral side of the desired nerve to achieve the "Scotty dog" appearance.  The skin over the target area of the junction of the superior articulating process and the transverse process (sacral ala if blocking the L5 dorsal rami) was locally anesthetized with a 1 ml volume of 1% Lidocaine without Epinephrine.  The spinal needle was inserted and advanced in a trajectory view down to the target.   After contact with periosteum and negative aspirate for blood and CSF, correct placement without intravascular or epidural spread was confirmed by injecting 0.5 ml. of Isovue-250.  A spot radiograph was obtained of this image.    Next, a 0.5 ml. volume of the injectate described above was injected. The needle was then redirected to the other facet joint nerves mentioned above if needed.  Prior to the procedure, the patient was given a Pain Diary which  was completed for baseline measurements.  After the procedure, the patient rated their pain every 30 minutes and will continue rating at this frequency for a total of 5 hours.  The patient has been asked to complete the Diary and return to us by mail, fax or hand delivered as soon as possible.   Additional Comments:  The patient tolerated the procedure well Dressing: Band-Aid    Post-procedure details: Patient was observed during the procedure. Post-procedure instructions were reviewed.  Patient left the clinic in stable condition.

## 2018-04-18 ENCOUNTER — Telehealth (INDEPENDENT_AMBULATORY_CARE_PROVIDER_SITE_OTHER): Payer: Self-pay

## 2018-04-18 NOTE — Telephone Encounter (Signed)
Submitted VOB for SynviscOne, right knee. 

## 2018-04-21 ENCOUNTER — Ambulatory Visit (INDEPENDENT_AMBULATORY_CARE_PROVIDER_SITE_OTHER): Payer: PRIVATE HEALTH INSURANCE | Admitting: Orthopaedic Surgery

## 2018-04-21 ENCOUNTER — Telehealth (INDEPENDENT_AMBULATORY_CARE_PROVIDER_SITE_OTHER): Payer: Self-pay

## 2018-04-21 NOTE — Telephone Encounter (Signed)
Called and left a VM advising patient that we have not received an approval from insurance for right knee gel injection, but could still come to her appointment with Dr. Magnus Ivan at 10:45am.

## 2018-04-29 ENCOUNTER — Other Ambulatory Visit: Payer: Self-pay | Admitting: Physician Assistant

## 2018-04-29 MED ORDER — TELMISARTAN-HCTZ 80-25 MG PO TABS: 1.0000 | ORAL_TABLET | Freq: Every day | ORAL | 3 refills | Status: DC

## 2018-04-30 ENCOUNTER — Telehealth (INDEPENDENT_AMBULATORY_CARE_PROVIDER_SITE_OTHER): Payer: Self-pay

## 2018-04-30 NOTE — Telephone Encounter (Signed)
Called and left a VM advising patient to call back and schedule an appointment with Dr. Magnus Ivan for gel injection.  Patient is approved for SynviscOne, right knee. Buy & Bill Covered at 100% through insurance. No Co-pay No PA required

## 2018-05-07 ENCOUNTER — Ambulatory Visit: Payer: PRIVATE HEALTH INSURANCE | Admitting: Physician Assistant

## 2018-05-07 ENCOUNTER — Other Ambulatory Visit: Payer: PRIVATE HEALTH INSURANCE

## 2018-05-08 ENCOUNTER — Encounter: Payer: Self-pay | Admitting: Physician Assistant

## 2018-05-14 ENCOUNTER — Ambulatory Visit (INDEPENDENT_AMBULATORY_CARE_PROVIDER_SITE_OTHER): Payer: PRIVATE HEALTH INSURANCE | Admitting: Orthopaedic Surgery

## 2018-05-19 ENCOUNTER — Encounter (INDEPENDENT_AMBULATORY_CARE_PROVIDER_SITE_OTHER): Payer: Self-pay | Admitting: Orthopaedic Surgery

## 2018-05-19 ENCOUNTER — Ambulatory Visit (INDEPENDENT_AMBULATORY_CARE_PROVIDER_SITE_OTHER): Payer: PRIVATE HEALTH INSURANCE | Admitting: Orthopaedic Surgery

## 2018-05-19 DIAGNOSIS — M1711 Unilateral primary osteoarthritis, right knee: Secondary | ICD-10-CM | POA: Diagnosis not present

## 2018-05-19 MED ORDER — HYLAN G-F 20 48 MG/6ML IX SOSY
48.0000 mg | PREFILLED_SYRINGE | INTRA_ARTICULAR | Status: AC | PRN
  Administered 2018-05-19: 48 mg via INTRA_ARTICULAR

## 2018-05-19 NOTE — Progress Notes (Signed)
   Procedure Note  Patient: Caroline Keller             Date of Birth: 1960/06/01           MRN: [id number]             Visit Date: 05/19/2018  Procedures: Visit Diagnoses: Unilateral primary osteoarthritis, right knee  Large Joint Inj: R knee on 05/19/2018 2:52 PM Indications: pain and diagnostic evaluation Details: 22 G 1.5 in needle, superolateral approach  Arthrogram: No  Medications: 48 mg Hylan 48 MG/6ML Outcome: tolerated well, no immediate complications Procedure, treatment alternatives, risks and benefits explained, specific risks discussed. Consent was given by the patient. Immediately prior to procedure a time out was called to verify the correct patient, procedure, equipment, support staff and site/side marked as required. Patient was prepped and draped in the usual sterile fashion.     The patient has known moderate osteoarthritis of the right knee.  She is only 58 years old.  We have tried steroid injections in her knee and at this point we recommend hyaluronic acid with Synvisc 1.  She is here for that injection today.  She understands fully the rationale behind injection such as this as well as the risk and benefits involved.  There is no effusion on the right knee today but global pain and tenderness with good motion.  She tolerated the Synvisc 1 injection well without difficulty.  All question concerns were answered and addressed.  We will see her back in about 3 months to see how she is doing overall.

## 2018-05-27 ENCOUNTER — Encounter: Payer: Self-pay | Admitting: Physician Assistant

## 2018-05-27 NOTE — Progress Notes (Signed)
Cardiology Office Note    Date:  05/28/2018  ID:  Caroline Keller, DOB 1961/01/31, MRN [id number] PCP:  Jarrett Soho, PA-C  Cardiologist:  Armanda Magic, MD   Chief Complaint: f/u BP, cholesterol  History of Present Illness:  Caroline Keller is a 58 y.o. female with history of HTN, PVC/PACs, HLD, treated hepatitis C, hiatal hernia who presents back for follow-up of BP. She has history of palpitations with stress/anxiety which have been treated with diltiazem. No prior echo on file, but nuc in 2016 was normal, EF 59%. She saw Arelia Longest NP 02/2018 for routine follow-up and it was noted that she had both high BP and high cholesterol. Micardis was increased to 80/12.5mg  daily and she was started on atorastatin 20mg . We do not have recent labs in Epic but Jill's note references Creatinine 0.8 and LDL 145 when checked on 12/06/2017.   She returns for follow-up and is doing well, asymptomatic. No CP, SOB, palpitations. She walks her dalmatian regularly - he's now about 7 months. BP is well controlled today. No med side effects.    Past Medical History:  Diagnosis Date  . Anxiety   . Arthritis of low back   . GERD (gastroesophageal reflux disease)   . Hepatitis C   . History of C-section   . History of cholecystectomy   . History of hiatal hernia   . Hyperlipidemia   . Hypertension   . PAC (premature atrial contraction)   . Palpitations   . PVC (premature ventricular contraction)     Past Surgical History:  Procedure Laterality Date  . CARDIOVASCULAR STRESS TEST  01/04/2015  . CESAREAN SECTION    . CHOLECYSTECTOMY      Current Medications: Current Meds  Medication Sig  . atorvastatin (LIPITOR) 20 MG tablet Take 1 tablet (20 mg total) by mouth daily.  . calcium carbonate 1250 MG capsule Take 1,250 mg by mouth 2 (two) times daily with a meal.  . clonazePAM (KLONOPIN) 1 MG tablet Take 1 tablet at bedtime by mouth. AS NEEDED FOR SLEEP  . diltiazem (CARDIZEM CD) 360 MG 24 hr capsule TAKE  1 CAPSULE (360 MG TOTAL) EVERY MORNING BY MOUTH.  Marland Kitchen escitalopram (LEXAPRO) 20 MG tablet Take 20 mg by mouth daily.  Marland Kitchen omeprazole (PRILOSEC) 20 MG capsule Take 20 mg by mouth 2 (two) times daily.  Marland Kitchen telmisartan-hydrochlorothiazide (MICARDIS HCT) 80-25 MG tablet Take 1 tablet by mouth daily.      Allergies:   Patient has no known allergies.   Social History   Socioeconomic History  . Marital status: Married    Spouse name: Not on file  . Number of children: Not on file  . Years of education: Not on file  . Highest education level: Not on file  Occupational History  . Not on file  Social Needs  . Financial resource strain: Not on file  . Food insecurity:    Worry: Not on file    Inability: Not on file  . Transportation needs:    Medical: Not on file    Non-medical: Not on file  Tobacco Use  . Smoking status: Former Smoker    Last attempt to quit: 04/16/1981    Years since quitting: 37.1  . Smokeless tobacco: Never Used  Substance and Sexual Activity  . Alcohol use: No  . Drug use: No  . Sexual activity: Not on file  Lifestyle  . Physical activity:    Days per week: Not on file  Minutes per session: Not on file  . Stress: Not on file  Relationships  . Social connections:    Talks on phone: Not on file    Gets together: Not on file    Attends religious service: Not on file    Active member of club or organization: Not on file    Attends meetings of clubs or organizations: Not on file    Relationship status: Not on file  Other Topics Concern  . Not on file  Social History Narrative  . Not on file     Family History:  The patient's family history includes Breast cancer in her sister; Coronary artery disease in her father; Lung cancer in her mother.  ROS:   Please see the history of present illness.  All other systems are reviewed and otherwise negative.    PHYSICAL EXAM:   VS:  BP 128/82   Pulse 76   Ht 5\' 5"  (1.651 m)   Wt 205 lb 6.4 oz (93.2 kg)   SpO2  95%   BMI 34.18 kg/m   BMI: Body mass index is 34.18 kg/m. GEN: Well nourished, well developed WF, in no acute distress HEENT: normocephalic, atraumatic Neck: no JVD, carotid bruits, or masses Cardiac: RRR; no murmurs, rubs, or gallops, no edema  Respiratory:  clear to auscultation bilaterally, normal work of breathing GI: soft, nontender, nondistended, + BS MS: no deformity or atrophy Skin: warm and dry, no rash Neuro:  Alert and Oriented x 3, Strength and sensation are intact, follows commands Psych: euthymic mood, full affect  Wt Readings from Last 3 Encounters:  05/28/18 205 lb 6.4 oz (93.2 kg)  03/11/18 206 lb (93.4 kg)  02/25/17 196 lb 8 oz (89.1 kg)      Studies/Labs Reviewed:   EKG:  EKG was not ordered today  Recent Labs: No results found for requested labs within last 8760 hours.   Lipid Panel    Component Value Date/Time   CHOL 184 01/04/2015 0211   TRIG 34 01/04/2015 0211   HDL 55 01/04/2015 0211   CHOLHDL 3.3 01/04/2015 0211   VLDL 7 01/04/2015 0211   LDLCALC 122 (H) 01/04/2015 0211    Additional studies/ records that were reviewed today include: Summarized above    ASSESSMENT & PLAN:   1. Essential HTN - controlled with increase in antihypertensive. Rechecked BP personally today and got 124/80. Continue present regimen. Discussed limiting salt intake, remaining physically active. Check BMET today. 2. Hyperlipidemia - recheck liver/lipids today. 3. PACs - quiescent on diltiazem. 4. PVCs - quiescent on diltiazem.  Disposition: F/u with Dr. Mayford Knifeurner in 1 year.  Medication Adjustments/Labs and Tests Ordered: Current medicines are reviewed at length with the patient today.  Concerns regarding medicines are outlined above. Medication changes, Labs and Tests ordered today are summarized above and listed in the Patient Instructions accessible in Encounters.   Signed, Laurann Montanaayna N Dunn, PA-C  05/28/2018 8:42 AM    [facility] [address], [place], [place]  [address] Phone: [phone]; Fax: [phone]

## 2018-05-28 ENCOUNTER — Ambulatory Visit (INDEPENDENT_AMBULATORY_CARE_PROVIDER_SITE_OTHER): Payer: PRIVATE HEALTH INSURANCE | Admitting: Physician Assistant

## 2018-05-28 ENCOUNTER — Other Ambulatory Visit: Payer: PRIVATE HEALTH INSURANCE

## 2018-05-28 ENCOUNTER — Encounter: Payer: Self-pay | Admitting: Physician Assistant

## 2018-05-28 VITALS — BP 128/82 | HR 76 | Ht 65.0 in | Wt 205.4 lb

## 2018-05-28 DIAGNOSIS — I10 Essential (primary) hypertension: Secondary | ICD-10-CM | POA: Diagnosis not present

## 2018-05-28 DIAGNOSIS — E785 Hyperlipidemia, unspecified: Secondary | ICD-10-CM | POA: Diagnosis not present

## 2018-05-28 DIAGNOSIS — I493 Ventricular premature depolarization: Secondary | ICD-10-CM

## 2018-05-28 DIAGNOSIS — I491 Atrial premature depolarization: Secondary | ICD-10-CM

## 2018-05-28 LAB — LIPID PANEL
Chol/HDL Ratio: 2.4 ratio (ref 0.0–4.4)
Cholesterol, Total: 161 mg/dL (ref 100–199)
HDL: 68 mg/dL (ref >39)
LDL Calculated: 74 mg/dL (ref 0–99)
Triglycerides: 97 mg/dL (ref 0–149)
VLDL Cholesterol Cal: 19 mg/dL (ref 5–40)

## 2018-05-28 LAB — COMPREHENSIVE METABOLIC PANEL
ALT: 11 IU/L (ref 0–32)
AST: 16 IU/L (ref 0–40)
Albumin/Globulin Ratio: 1.7 (ref 1.2–2.2)
Albumin: 4.4 g/dL (ref 3.8–4.9)
Alkaline Phosphatase: 95 IU/L (ref 39–117)
BUN/Creatinine Ratio: 23 (ref 9–23)
BUN: 20 mg/dL (ref 6–24)
Bilirubin Total: 0.4 mg/dL (ref 0.0–1.2)
CHLORIDE: 103 mmol/L (ref 96–106)
CO2: 21 mmol/L (ref 20–29)
Calcium: 9.7 mg/dL (ref 8.7–10.2)
Creatinine, Ser: 0.88 mg/dL (ref 0.57–1.00)
GFR calc Af Amer: 83 mL/min/{1.73_m2} (ref >59)
GFR calc non Af Amer: 72 mL/min/{1.73_m2} (ref >59)
GLOBULIN, TOTAL: 2.6 g/dL (ref 1.5–4.5)
Glucose: 83 mg/dL (ref 65–99)
Potassium: 4 mmol/L (ref 3.5–5.2)
Sodium: 138 mmol/L (ref 134–144)
Total Protein: 7 g/dL (ref 6.0–8.5)

## 2018-05-28 NOTE — Patient Instructions (Signed)
Medication Instructions:  Your physician recommends that you continue on your current medications as directed. Please refer to the Current Medication list given to you today.  If you need a refill on your cardiac medications before your next appointment, please call your pharmacy.   Lab work: TODAY:  CMET & LIPID  If you have labs (blood work) drawn today and your tests are completely normal, you will receive your results only by: Marland Kitchen MyChart Message (if you have MyChart) OR . A paper copy in the mail If you have any lab test that is abnormal or we need to change your treatment, we will call you to review the results.  Testing/Procedures: None ordered  Follow-Up: At [facility], you and your health needs are our priority.  As part of our continuing mission to provide you with exceptional heart care, we have created designated Provider Care Teams.  These Care Teams include your primary Cardiologist (physician) and Advanced Practice Providers (APPs -  Physician Assistants and Nurse Practitioners) who all work together to provide you with the care you need, when you need it. You will need a follow up appointment in 12 months.  Please call our office 2 months in advance to schedule this appointment.  You may see Armanda Magic, MD or one of the following Advanced Practice Providers on your designated Care Team:   [place], PA-C Ronie Spies, PA-C . Jacolyn Reedy, PA-C  Any Other Special Instructions Will Be Listed Below (If Applicable).

## 2018-06-10 ENCOUNTER — Telehealth: Payer: Self-pay | Admitting: Cardiology

## 2018-06-10 DIAGNOSIS — Z79899 Other long term (current) drug therapy: Secondary | ICD-10-CM

## 2018-06-10 MED ORDER — ATORVASTATIN CALCIUM 10 MG PO TABS: 10.0000 mg | ORAL_TABLET | Freq: Every day | ORAL | 3 refills | Status: DC

## 2018-06-10 NOTE — Telephone Encounter (Signed)
Caroline Mediate, NP started Atorvastatin 03/11/18. Pt recently by Ronie Spies, PA.

## 2018-06-10 NOTE — Telephone Encounter (Signed)
Called pt re: muscle pain in arms.  Pt denies cp / sob. Ok, per [employer], PA-C, to reduce the Atorvastatin to 10 mg daily. Pt will call when she will need a refill, as she just got a new bottle and will split in 1/2. Pt will repeat Lipid the week of 07/28/2018.  Pt thanked me for returning her call.

## 2018-06-10 NOTE — Telephone Encounter (Signed)
Please confirm no CP or SOB or symptoms to suggest angina with bilateral arm pain. If not, OK to reduce Lipitor to 10mg  daily. Keep [place] posted how symptoms go. Recheck lipids in 8 weeks.  Dayna Dunn PA-C

## 2018-06-10 NOTE — Telephone Encounter (Signed)
New Message   Pt c/o medication issue:  1. Name of Medication: atorvastatin (LIPITOR) 20 MG tablet   2. How are you currently taking this medication (dosage and times per day)? Take 1 tablet (20 mg total) by mouth daily.  3. Are you having a reaction (difficulty breathing--STAT)?   4. What is your medication issue? Patient called to advise that the medication is causing muscle pains in her arms. Please call to discuss.

## 2018-08-18 ENCOUNTER — Other Ambulatory Visit: Payer: PRIVATE HEALTH INSURANCE

## 2018-08-18 ENCOUNTER — Ambulatory Visit (INDEPENDENT_AMBULATORY_CARE_PROVIDER_SITE_OTHER): Payer: PRIVATE HEALTH INSURANCE | Admitting: Orthopaedic Surgery

## 2018-09-15 ENCOUNTER — Ambulatory Visit: Payer: Self-pay | Admitting: Orthopaedic Surgery

## 2018-10-01 ENCOUNTER — Encounter: Payer: Self-pay | Admitting: Physician Assistant

## 2018-10-01 ENCOUNTER — Ambulatory Visit (INDEPENDENT_AMBULATORY_CARE_PROVIDER_SITE_OTHER): Payer: PRIVATE HEALTH INSURANCE | Admitting: Physician Assistant

## 2018-10-01 ENCOUNTER — Other Ambulatory Visit: Payer: Self-pay

## 2018-10-01 ENCOUNTER — Ambulatory Visit (INDEPENDENT_AMBULATORY_CARE_PROVIDER_SITE_OTHER): Payer: PRIVATE HEALTH INSURANCE

## 2018-10-01 DIAGNOSIS — M25562 Pain in left knee: Secondary | ICD-10-CM

## 2018-10-01 DIAGNOSIS — G8929 Other chronic pain: Secondary | ICD-10-CM | POA: Diagnosis not present

## 2018-10-01 MED ORDER — LIDOCAINE HCL 1 % IJ SOLN
3.0000 mL | INTRAMUSCULAR | Status: AC | PRN
  Administered 2018-10-01: 3 mL

## 2018-10-01 MED ORDER — METHYLPREDNISOLONE ACETATE 40 MG/ML IJ SUSP
40.0000 mg | INTRAMUSCULAR | Status: AC | PRN
  Administered 2018-10-01: 12:00:00 40 mg via INTRA_ARTICULAR

## 2018-10-01 NOTE — Progress Notes (Signed)
Office Visit Note   Patient: Caroline Keller           Date of Birth: 1961-01-19           MRN: [id number] Visit Date: 10/01/2018              Requested by: Jarrett SohoWharton, Courtney, PA-C [address] [place],  [place] [id number] PCP: Jarrett SohoWharton, Courtney, PA-C   Assessment & Plan: Visit Diagnoses:  1. Acute pain of left knee     Plan: We will have patient follow-up with us in 2 weeks see what type of response she had to the injection.  If she continues to have pain may consider MRI due to the fact that she had just mild narrowing of the left knee on radiographs.  Similar to what she had in regards to the right knee which showed normal radiographs with on MRI she had moderate arthritis of the right knee.  She underwent a Synvisc injection in the right knee and the right knee at this point time is doing well.  She may benefit from a Synvisc injection in the left knee if she is having no mechanical symptoms but does not get good relief with the cortisone.  Questions were encouraged and answered.  Follow-Up Instructions: Return in about 2 weeks (around 10/15/2018).   Orders:  Orders Placed This Encounter  Procedures  . Large Joint Inj: L knee  . XR Knee 1-2 Views Left   No orders of the defined types were placed in this encounter.     Procedures: Large Joint Inj: L knee on 10/01/2018 11:47 AM Indications: pain Details: 22 G 1.5 in needle, superolateral approach  Arthrogram: No  Medications: 3 mL lidocaine 1 %; 40 mg methylPREDNISolone acetate 40 MG/ML Outcome: tolerated well, no immediate complications Procedure, treatment alternatives, risks and benefits explained, specific risks discussed. Consent was given by the patient. Immediately prior to procedure a time out was called to verify the correct patient, procedure, equipment, support staff and site/side marked as required. Patient was prepped and draped in the usual sterile fashion.       Clinical Data: No additional findings.    Subjective: Chief Complaint  Patient presents with  . Left Knee - Follow-up    HPI Caroline Keller 58 year old female well-known to our department service comes in today with left knee pain.  States it feels similar to what she had in her right knee.  She had normal radiographs of the right knee and then underwent an MRI which showed moderate arthritis of the knee.  She eventually underwent a Synvisc injection of the right knee on 05/19/2018 and has had good relief with this.  She did is no mechanical symptoms of the left knee.  She did have a injury to the left proximal fibula September 2019 but this was a nondisplaced proximal fracture and she did well with this requiring no intervention and basically just relative rest.  Aspiration was attempted but was basically a dry tap with only approximately 1 cc of yellow synovial fluid obtained. Review of Systems Denies any fevers or chills.  Objective: Vital Signs: There were no vitals taken for this visit.  Physical Exam Constitutional:      Appearance: She is not ill-appearing or diaphoretic.  Pulmonary:     Effort: Pulmonary effort is normal.  Neurological:     Mental Status: She is alert.  Psychiatric:        Mood and Affect: Mood normal.  Behavior: Behavior normal.     Ortho Exam Bilateral knees no instability valgus varus stressing.  No abnormal warmth erythema of the either knee.  Right knee no effusion abnormal warmth.  Left knee positive edema plus minus effusion.  Right knee nontender throughout.  Left knee tenderness along medial joint line.  McMurray's is negative on the left. Specialty Comments:  No specialty comments available.  Imaging: Xr Knee 1-2 Views Left  Result Date: 10/01/2018 AP and lateral views left knee: Mild narrowing medial joint line.  Mild patellofemoral changes.  Lateral joint lines well-maintained.  No acute fractures.  Knee is well located.  No bony abnormalities.    PMFS History: Patient Active  Problem List   Diagnosis Date Noted  . Unilateral primary osteoarthritis, right knee 03/19/2018  . Hyperlipidemia 02/25/2017  . Chronic left-sided low back pain with left-sided sciatica 09/13/2016  . Trigger thumb, left thumb 09/13/2016  . Trigger thumb, right thumb 09/13/2016  . Substernal chest pain 01/03/2015  . PVC's (premature ventricular contractions) 03/23/2014  . PAC (premature atrial contraction) 03/23/2014  . Benign essential HTN 03/23/2014  . Hepatitis C 12/08/2013  . GERD (gastroesophageal reflux disease)    Past Medical History:  Diagnosis Date  . Anxiety   . Arthritis of low back   . GERD (gastroesophageal reflux disease)   . Hepatitis C   . History of C-section   . History of cholecystectomy   . History of hiatal hernia   . Hyperlipidemia   . Hypertension   . PAC (premature atrial contraction)   . Palpitations   . PVC (premature ventricular contraction)     Family History  Problem Relation Age of Onset  . Lung cancer Mother   . Coronary artery disease Father   . Breast cancer Sister     Past Surgical History:  Procedure Laterality Date  . CARDIOVASCULAR STRESS TEST  01/04/2015  . CESAREAN SECTION    . CHOLECYSTECTOMY     Social History   Occupational History  . Not on file  Tobacco Use  . Smoking status: Former Smoker    Quit date: 04/16/1981    Years since quitting: 37.4  . Smokeless tobacco: Never Used  Substance and Sexual Activity  . Alcohol use: No  . Drug use: No  . Sexual activity: Not on file

## 2018-10-08 ENCOUNTER — Other Ambulatory Visit: Payer: Self-pay

## 2018-10-08 ENCOUNTER — Encounter: Payer: Self-pay | Admitting: Orthopaedic Surgery

## 2018-10-08 ENCOUNTER — Ambulatory Visit (INDEPENDENT_AMBULATORY_CARE_PROVIDER_SITE_OTHER): Payer: PRIVATE HEALTH INSURANCE | Admitting: Orthopaedic Surgery

## 2018-10-08 ENCOUNTER — Ambulatory Visit: Payer: PRIVATE HEALTH INSURANCE | Admitting: Orthopaedic Surgery

## 2018-10-08 DIAGNOSIS — M25562 Pain in left knee: Secondary | ICD-10-CM | POA: Diagnosis not present

## 2018-10-08 DIAGNOSIS — G8929 Other chronic pain: Secondary | ICD-10-CM | POA: Diagnosis not present

## 2018-10-08 DIAGNOSIS — M1711 Unilateral primary osteoarthritis, right knee: Secondary | ICD-10-CM | POA: Diagnosis not present

## 2018-10-08 NOTE — Progress Notes (Signed)
The patient is continuing to have significant left knee pain in spite of a steroid injection at left knee.  It is all along the medial joint space as well as the knee.  She has been having locking and catching as well.  She is failed conservative treatment with activity modification, quad strengthening exercises, anti-inflammatories, rest and even steroid injections.  At this point an MRI is warranted to rule out a meniscal tear.  On exam she has pain along the medial joint line of her right knee with a positive McMurray's exam to the medial compartment.  She has significant pain past 90 degrees of flexion all along the medial and posterior medial aspect of the right knee.  X-rays from her last visit showed well-maintained joint space.  This point I think is essential we obtain an MRI of her left knee to assess the cartilage and rule out a meniscal tear.  She has had hyaluronic acid before on the right knee and did very well with this.  I am hesitant to order this for her left knee until we know whether or not she has a meniscal tear based on her mechanical symptoms and clinical exam.  She understands this as well.  We will give her a note for work for modified work duties in the interim.

## 2018-10-09 ENCOUNTER — Other Ambulatory Visit: Payer: Self-pay

## 2018-10-09 DIAGNOSIS — G8929 Other chronic pain: Secondary | ICD-10-CM

## 2018-10-15 ENCOUNTER — Ambulatory Visit: Payer: PRIVATE HEALTH INSURANCE | Admitting: Orthopaedic Surgery

## 2018-10-15 HISTORY — PX: KNEE SURGERY: SHX244

## 2018-10-20 ENCOUNTER — Telehealth: Payer: Self-pay

## 2018-10-20 NOTE — Telephone Encounter (Signed)
Received a call from [facility] in [place] concerning an authorization for MRI for left knee.  Cb# is [phone].  Fax# is [phone].  Please advise.  Thank you.

## 2018-10-22 ENCOUNTER — Encounter: Payer: Self-pay | Admitting: Orthopaedic Surgery

## 2018-10-22 ENCOUNTER — Ambulatory Visit (INDEPENDENT_AMBULATORY_CARE_PROVIDER_SITE_OTHER): Payer: PRIVATE HEALTH INSURANCE | Admitting: Orthopaedic Surgery

## 2018-10-22 ENCOUNTER — Other Ambulatory Visit: Payer: Self-pay

## 2018-10-22 DIAGNOSIS — S83242A Other tear of medial meniscus, current injury, left knee, initial encounter: Secondary | ICD-10-CM | POA: Diagnosis not present

## 2018-10-22 DIAGNOSIS — M25562 Pain in left knee: Secondary | ICD-10-CM

## 2018-10-22 NOTE — Progress Notes (Signed)
The patient is here today to review an MRI of her left knee.  She has had an acute twisting injury to that left knee and had significant pain.  She failed a steroid injection as well as activity modification, rest and anti-inflammatories.  She had done well with her right knee even with a hyaluronic acid injection.  Her left knee did not show the significant cartilage loss on plain film so an MRI was obtained.  She is here for review this today.  She still has pain with her knee only with weightbearing and pivoting activities and not when resting her knee.  She is still having some locking and catching as well.  MRI of her left knee is reviewed with her and there is a meniscal tear of the medial meniscus and horizontal signal change in the lateral meniscus.  Is the medial meniscus noted that is more symptomatic with a significant tear.  There is only thinning of the articular cartilage of the medial compartment.  There is full-thickness cartilage loss underneath the patella but she does not have any significant crepitation on exam.  All her pain is on the medial joint line of her left knee with a positive McMurray's exam to the medial compartment.  Given the MRI findings as well as her clinical exam findings and continued discomfort we are recommending an arthroscopic intervention.  We had a long and thorough discussion about this showing her knee model.  I explained the risk and benefits of surgery as well.  We talked about her interoperative and postoperative course.  I went over her MRI findings as well.  All question concerns were answered addressed.  We will work on getting surgery scheduled in the near future and see her back in 1 week postoperative.

## 2018-10-23 NOTE — Telephone Encounter (Signed)
completed

## 2018-10-30 ENCOUNTER — Other Ambulatory Visit: Payer: Self-pay | Admitting: Orthopaedic Surgery

## 2018-10-30 DIAGNOSIS — S83232A Complex tear of medial meniscus, current injury, left knee, initial encounter: Secondary | ICD-10-CM

## 2018-10-30 MED ORDER — OXYCODONE HCL 5 MG PO TABS: 5.0000 mg | ORAL_TABLET | Freq: Four times a day (QID) | ORAL | 0 refills | Status: DC | PRN

## 2018-11-06 ENCOUNTER — Ambulatory Visit (INDEPENDENT_AMBULATORY_CARE_PROVIDER_SITE_OTHER): Payer: PRIVATE HEALTH INSURANCE | Admitting: Orthopaedic Surgery

## 2018-11-06 ENCOUNTER — Other Ambulatory Visit: Payer: Self-pay

## 2018-11-06 ENCOUNTER — Encounter: Payer: Self-pay | Admitting: Orthopaedic Surgery

## 2018-11-06 DIAGNOSIS — Z9889 Other specified postprocedural states: Secondary | ICD-10-CM

## 2018-11-06 NOTE — Progress Notes (Signed)
The patient is one-week status post a left knee arthroscopy with partial medial meniscectomy.  She has significantly large and complex medial meniscal tear.  Her cartilage on the medial compartment of her knee just only showed some slight thinning.  She did have some more significant cartilage loss in the trochlear groove but the lateral aspect of her knee looked great as did the intercondylar area with an intact ACL PCL.  I did go over the scope pictures with her.  She is doing well overall and is only had some mild pain.  She hurts more with weightbearing in terms of flexing her knee back further but overall feels like she making good progress.  Examination of her left knee shows the sutures look good so remove them.  There is no significant knee effusion of her knee.  This is a little bit of fullness in the back of the knee.  Her calf is soft and she does have good range of motion of her knee that is painful on extremes of flexion.  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  She is appropriate candidate for hyaluronic acid in that left knee in the future if we get to that point given how she is responded with her right knee in the past to hyaluronic acid.  All question concerns were answered addressed.  We will see what she looks like in 4 weeks.

## 2018-12-08 ENCOUNTER — Encounter: Payer: Self-pay | Admitting: Orthopaedic Surgery

## 2018-12-08 ENCOUNTER — Ambulatory Visit (INDEPENDENT_AMBULATORY_CARE_PROVIDER_SITE_OTHER): Payer: PRIVATE HEALTH INSURANCE | Admitting: Orthopaedic Surgery

## 2018-12-08 DIAGNOSIS — Z9889 Other specified postprocedural states: Secondary | ICD-10-CM

## 2018-12-08 NOTE — Progress Notes (Signed)
The patient is a 58 year old female who is well-known to me.  She is between 5 and 6 weeks status post a left knee arthroscopy.  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  She is making progress she states been doing better but still sore.  We have talked about hyaluronic acid at some point for her left knee.  She has had a successful treatment with this on the right side.  Examination of her left knee there is no effusion but there is still some slight warmth.  Her incisions have healed nicely.  She has good range of motion of her left knee and her knees do hyperextend.  Since she is doing well we will continue to watch her conservatively.  I would like to see her back in 4 weeks to see how she is doing overall.  We may consider steroid injection in her left knee at that visit only if she is having enough pain or discomfort because at that point should be at least 10 weeks postoperative.

## 2019-01-05 ENCOUNTER — Telehealth: Payer: Self-pay | Admitting: Radiology

## 2019-01-05 ENCOUNTER — Ambulatory Visit (INDEPENDENT_AMBULATORY_CARE_PROVIDER_SITE_OTHER): Payer: PRIVATE HEALTH INSURANCE | Admitting: Orthopaedic Surgery

## 2019-01-05 ENCOUNTER — Other Ambulatory Visit: Payer: Self-pay

## 2019-01-05 ENCOUNTER — Encounter: Payer: Self-pay | Admitting: Orthopaedic Surgery

## 2019-01-05 VITALS — Ht 64.0 in | Wt 195.0 lb

## 2019-01-05 DIAGNOSIS — M25562 Pain in left knee: Secondary | ICD-10-CM | POA: Diagnosis not present

## 2019-01-05 DIAGNOSIS — Z9889 Other specified postprocedural states: Secondary | ICD-10-CM | POA: Diagnosis not present

## 2019-01-05 MED ORDER — LIDOCAINE HCL 1 % IJ SOLN
3.0000 mL | INTRAMUSCULAR | Status: AC | PRN
  Administered 2019-01-05: 14:00:00 3 mL

## 2019-01-05 MED ORDER — METHYLPREDNISOLONE ACETATE 40 MG/ML IJ SUSP
40.0000 mg | INTRAMUSCULAR | Status: AC | PRN
  Administered 2019-01-05: 40 mg via INTRA_ARTICULAR

## 2019-01-05 NOTE — Progress Notes (Signed)
Office Visit Note   Patient: Caroline Keller           Date of Birth: [birth year]           MRN: [id number] Visit Date: 01/05/2019              Requested by: Jarrett Soho, PA-C [address] [place],  [place] [address] PCP: Jarrett Soho, PA-C   Assessment & Plan: Visit Diagnoses:  1. S/P arthroscopy of left knee     Plan: I do feel that she would benefit from a steroid injection postoperatively and her left knee today and then in 4 weeks from now placed hyaluronic acid into the left knee.  She agrees with this treatment plan.  Is work with her significantly for past on her right knee.  She tolerated steroid injection today.  We will see her back in 4 weeks to hopefully place hyaluronic acid into the left knee.  All question concerns were answered and addressed.  Follow-Up Instructions: Return in about 4 weeks (around 02/02/2019).   Orders:  Orders Placed This Encounter  Procedures  . Large Joint Inj   No orders of the defined types were placed in this encounter.     Procedures: Large Joint Inj: L knee on 01/05/2019 1:42 PM Indications: diagnostic evaluation and pain Details: 22 G 1.5 in needle, superolateral approach  Arthrogram: No  Medications: 3 mL lidocaine 1 %; 40 mg methylPREDNISolone acetate 40 MG/ML Outcome: tolerated well, no immediate complications Procedure, treatment alternatives, risks and benefits explained, specific risks discussed. Consent was given by the patient. Immediately prior to procedure a time out was called to verify the correct patient, procedure, equipment, support staff and site/side marked as required. Patient was prepped and draped in the usual sterile fashion.       Clinical Data: No additional findings.   Subjective: Chief Complaint  Patient presents with  . Left Knee - Follow-up    10/30/2018 Left Knee Arthroscopy/PMM  The patient is now 2 months status post a left knee arthroscopy in which we performed a partial medial  meniscectomy.  She did have some chondromalacia of the medial compartment and patellofemoral joint of her left knee.  She is doing well since the surgery.  She has had a history of hyaluronic acid injection placed in her right knee and that is done very well.  She is requesting this for her left knee and I agree with it based on what we saw from her interoperative findings.  She still having some twinges of knee pain.  She is back to work as well.  HPI  Review of Systems She currently denies any headache, chest pain, shortness of breath, fever, chills, nausea, vomiting  Objective: Vital Signs: Ht 5\' 4"  (1.626 m)   Wt 195 lb (88.5 kg)   BMI 33.47 kg/m   Physical Exam She is alert and orient x3 and in no acute distress Ortho Exam Examination of her left knee shows no effusion today.  There is medial joint line tenderness to be expected given her surgery but the knee moves well and her McMurray's exam is negative. Specialty Comments:  No specialty comments available.  Imaging: No results found.   PMFS History: Patient Active Problem List   Diagnosis Date Noted  . S/P arthroscopy of left knee 11/06/2018  . Unilateral primary osteoarthritis, right knee 03/19/2018  . Hyperlipidemia 02/25/2017  . Chronic left-sided low back pain with left-sided sciatica 09/13/2016  . Trigger thumb, left thumb 09/13/2016  .  Trigger thumb, right thumb 09/13/2016  . Substernal chest pain 01/03/2015  . PVC's (premature ventricular contractions) 03/23/2014  . PAC (premature atrial contraction) 03/23/2014  . Benign essential HTN 03/23/2014  . Hepatitis C 12/08/2013  . GERD (gastroesophageal reflux disease)    Past Medical History:  Diagnosis Date  . Anxiety   . Arthritis of low back   . GERD (gastroesophageal reflux disease)   . Hepatitis C   . History of C-section   . History of cholecystectomy   . History of hiatal hernia   . Hyperlipidemia   . Hypertension   . PAC (premature atrial  contraction)   . Palpitations   . PVC (premature ventricular contraction)     Family History  Problem Relation Age of Onset  . Lung cancer Mother   . Coronary artery disease Father   . Breast cancer Sister     Past Surgical History:  Procedure Laterality Date  . CARDIOVASCULAR STRESS TEST  01/04/2015  . CESAREAN SECTION    . CHOLECYSTECTOMY     Social History   Occupational History  . Not on file  Tobacco Use  . Smoking status: Former Smoker    Quit date: 04/16/1981    Years since quitting: 37.7  . Smokeless tobacco: Never Used  Substance and Sexual Activity  . Alcohol use: No  . Drug use: No  . Sexual activity: Not on file

## 2019-01-05 NOTE — Telephone Encounter (Signed)
Please submit for authorization for hyaluronic acid injection for left knee.

## 2019-01-06 NOTE — Telephone Encounter (Signed)
Noted  

## 2019-01-09 ENCOUNTER — Telehealth: Payer: Self-pay

## 2019-01-09 NOTE — Telephone Encounter (Signed)
Submitted VOB for SynviscOne, left knee. 

## 2019-01-30 ENCOUNTER — Telehealth: Payer: Self-pay

## 2019-01-30 NOTE — Telephone Encounter (Signed)
Approved for SynviscOne, left knee. Buy & Bill Covered at 100% of the allowed amount No Co-pay No PA required  Appt. 02/02/2019 with Dr. Ninfa Linden

## 2019-02-02 ENCOUNTER — Ambulatory Visit (INDEPENDENT_AMBULATORY_CARE_PROVIDER_SITE_OTHER): Payer: PRIVATE HEALTH INSURANCE | Admitting: Orthopaedic Surgery

## 2019-02-02 ENCOUNTER — Other Ambulatory Visit: Payer: Self-pay

## 2019-02-02 ENCOUNTER — Encounter: Payer: Self-pay | Admitting: Orthopaedic Surgery

## 2019-02-02 DIAGNOSIS — M1712 Unilateral primary osteoarthritis, left knee: Secondary | ICD-10-CM | POA: Insufficient documentation

## 2019-02-02 MED ORDER — HYLAN G-F 20 48 MG/6ML IX SOSY
48.0000 mg | PREFILLED_SYRINGE | INTRA_ARTICULAR | Status: AC | PRN
  Administered 2019-02-02: 48 mg via INTRA_ARTICULAR

## 2019-02-02 NOTE — Progress Notes (Signed)
   Procedure Note  Patient: Caroline Keller             Date of Birth: 1961-02-13           MRN: [id number]             Visit Date: 02/02/2019  Procedures: Visit Diagnoses:  1. Unilateral primary osteoarthritis, left knee     Large Joint Inj: L knee on 02/02/2019 1:01 PM Indications: pain and diagnostic evaluation Details: 22 G 1.5 in needle, superolateral approach  Arthrogram: No  Medications: 48 mg Hylan 48 MG/6ML Outcome: tolerated well, no immediate complications Procedure, treatment alternatives, risks and benefits explained, specific risks discussed. Consent was given by the patient. Immediately prior to procedure a time out was called to verify the correct patient, procedure, equipment, support staff and site/side marked as required. Patient was prepped and draped in the usual sterile fashion.    The patient comes in today for scheduled hyaluronic acid injection with Synvisc 1 in her left knee to treat the pain from osteoarthritis.  She has had this before for her right knee.  On examination of her left knee today there is no effusion and she has good range of motion but global tenderness.  She has tried and failed other forms conservative treatment.  She tolerated Synvisc 1 well in her left knee.  We can see her back in 3 months to see how she is doing overall since we do follow her for both knees.  All question concerns were answered and addressed.

## 2019-02-16 ENCOUNTER — Other Ambulatory Visit: Payer: Self-pay | Admitting: Physician Assistant

## 2019-03-09 ENCOUNTER — Other Ambulatory Visit: Payer: Self-pay

## 2019-03-09 DIAGNOSIS — Z20822 Contact with and (suspected) exposure to covid-19: Secondary | ICD-10-CM

## 2019-03-10 LAB — NOVEL CORONAVIRUS, NAA: SARS-CoV-2, NAA: DETECTED — AB

## 2019-03-11 ENCOUNTER — Other Ambulatory Visit: Payer: Self-pay | Admitting: Critical Care Medicine

## 2019-03-11 ENCOUNTER — Encounter: Payer: Self-pay | Admitting: Critical Care Medicine

## 2019-03-11 DIAGNOSIS — E782 Mixed hyperlipidemia: Secondary | ICD-10-CM

## 2019-03-11 DIAGNOSIS — U071 COVID-19: Secondary | ICD-10-CM

## 2019-03-11 DIAGNOSIS — I10 Essential (primary) hypertension: Secondary | ICD-10-CM

## 2019-03-11 DIAGNOSIS — I491 Atrial premature depolarization: Secondary | ICD-10-CM

## 2019-03-11 DIAGNOSIS — I493 Ventricular premature depolarization: Secondary | ICD-10-CM

## 2019-03-11 NOTE — Progress Notes (Signed)
  I connected by phone with Caroline Keller on 03/11/2019 at 4:50 PM to discuss the potential use of an new treatment for mild to moderate COVID-19 viral infection in non-hospitalized patients.  This patient is a 58 y.o. female that meets the FDA criteria for Emergency Use Authorization of bamlanivimab:  Has a (+) direct SARS-CoV-2 viral test result  Has mild or moderate COVID-19   Is ? 60 years of age and weighs ? 40 kg  Is NOT hospitalized due to COVID-19  Is NOT requiring oxygen therapy or requiring an increase in baseline oxygen flow rate due to COVID-19  Is within 10 days of symptom onset  Has at least one of the high risk factor(s) for progression to severe COVID-19 and/or hospitalization as defined in EUA.  Specific high risk criteria : Hypertension, Cardiovascular disease  Patient Active Problem List   Diagnosis Date Noted  . Unilateral primary osteoarthritis, left knee 02/02/2019  . S/P arthroscopy of left knee 11/06/2018  . Unilateral primary osteoarthritis, right knee 03/19/2018  . Hyperlipidemia 02/25/2017  . Chronic left-sided low back pain with left-sided sciatica 09/13/2016  . Trigger thumb, left thumb 09/13/2016  . Trigger thumb, right thumb 09/13/2016  . Substernal chest pain 01/03/2015  . PVC's (premature ventricular contractions) 03/23/2014  . PAC (premature atrial contraction) 03/23/2014  . Benign essential HTN 03/23/2014  . Hepatitis C 12/08/2013  . GERD (gastroesophageal reflux disease)     I have spoken and communicated the following to the patient or parent/caregiver:  1. FDA has authorized the emergency use of bamlanivimab for the treatment of mild to moderate COVID-19 in adults and pediatric patients with positive results of direct SARS-CoV-2 viral testing who are 48 years of age and older weighing at least 40 kg, and who are at high risk for progressing to severe COVID-19 and/or hospitalization.  2. The significant known and potential risks and benefits  of bamlanivimab, and the extent to which such potential risks and benefits are unknown.  3. Information on available alternative treatments and the risks and benefits of those alternatives, including clinical trials.  4. Patients treated with bamlanivimab should continue to self-isolate and use infection control measures (e.g., wear mask, isolate, social distance, avoid sharing personal items, clean and disinfect "high touch" surfaces, and frequent handwashing) according to CDC guidelines.   5. The patient or parent/caregiver has the option to accept or refuse bamlanivimab.  After reviewing this information with the patient, The patient agreed to proceed with receiving the infusion of bamlanivimab and will be provided a copy of the Fact sheet prior to receiving the infusion.  Asencion Noble 03/11/2019 4:50 PM

## 2019-03-11 NOTE — Progress Notes (Signed)
Virtual Visit via Telephone Note  I connected with Caroline Keller on 03/11/19 at  by telephone and verified that I am speaking with the correct person using two identifiers.   Consent:  I discussed the limitations, risks, security and privacy concerns of performing an evaluation and management service by telephone and the availability of in person appointments. I also discussed with the patient that there may be a patient responsible charge related to this service. The patient expressed understanding and agreed to proceed.  Location of patient: Patient was at home  Location of provider: I was in the office  Persons participating in the televisit with the patient.   No one else on the call    History of Present Illness: This is a 58 year old woman who has cardiovascular disease and hypertension denies COVID-19 infection.  The patient began being symptomatic on 21 November.  She has had fever sore throat cough severe fatigue.  The patient does have elevated blood pressure history of PACs and PVCs   Observations/Objective: No observations were made as this is a phone visit  Assessment and Plan: COVID-19 infection with comorbidities of hypertension and cardiovascular disease in a patient who is greater than 79 years of age  The patient agrees to the monoclonal antibody infusion  Follow Up Instructions:    I discussed the assessment and treatment plan with the patient. The patient was provided an opportunity to ask questions and all were answered. The patient agreed with the plan and demonstrated an understanding of the instructions.  I provided 30 minutes of non-face-to-face time during this encounter  including  median intraservice time , review of notes, labs, imaging, medications  and explaining diagnosis and management to the patient .    Asencion Noble, MD

## 2019-03-13 ENCOUNTER — Encounter (HOSPITAL_COMMUNITY): Payer: Self-pay

## 2019-03-13 ENCOUNTER — Ambulatory Visit (HOSPITAL_COMMUNITY)
Admission: RE | Admit: 2019-03-13 | Discharge: 2019-03-13 | Disposition: A | Discharge status: Home / Self Care | Payer: PRIVATE HEALTH INSURANCE | Source: Ambulatory Visit | Attending: Critical Care Medicine | Admitting: Critical Care Medicine

## 2019-03-13 DIAGNOSIS — I491 Atrial premature depolarization: Secondary | ICD-10-CM | POA: Diagnosis present

## 2019-03-13 DIAGNOSIS — I493 Ventricular premature depolarization: Secondary | ICD-10-CM | POA: Insufficient documentation

## 2019-03-13 DIAGNOSIS — I10 Essential (primary) hypertension: Secondary | ICD-10-CM | POA: Diagnosis present

## 2019-03-13 DIAGNOSIS — U071 COVID-19: Secondary | ICD-10-CM | POA: Insufficient documentation

## 2019-03-13 DIAGNOSIS — E782 Mixed hyperlipidemia: Secondary | ICD-10-CM | POA: Diagnosis present

## 2019-03-13 MED ORDER — EPINEPHRINE 0.3 MG/0.3ML IJ SOAJ: 0.3000 mg | Freq: Once | INTRAMUSCULAR | Status: DC | PRN

## 2019-03-13 MED ORDER — SODIUM CHLORIDE 0.9 % IV SOLN: INTRAVENOUS | Status: DC | PRN

## 2019-03-13 MED ORDER — ALBUTEROL SULFATE HFA 108 (90 BASE) MCG/ACT IN AERS: 2.0000 | INHALATION_SPRAY | Freq: Once | RESPIRATORY_TRACT | Status: DC | PRN

## 2019-03-13 MED ORDER — SODIUM CHLORIDE 0.9 % IV SOLN
700.0000 mg | Freq: Once | INTRAVENOUS | Status: AC
  Administered 2019-03-13: 700 mg via INTRAVENOUS
  Filled 2019-03-13: qty 20

## 2019-03-13 MED ORDER — METHYLPREDNISOLONE SODIUM SUCC 125 MG IJ SOLR: 125.0000 mg | Freq: Once | INTRAMUSCULAR | Status: DC | PRN

## 2019-03-13 MED ORDER — FAMOTIDINE IN NACL 20-0.9 MG/50ML-% IV SOLN: 20.0000 mg | Freq: Once | INTRAVENOUS | Status: DC | PRN

## 2019-03-13 MED ORDER — DIPHENHYDRAMINE HCL 50 MG/ML IJ SOLN: 50.0000 mg | Freq: Once | INTRAMUSCULAR | Status: DC | PRN

## 2019-03-13 NOTE — Progress Notes (Signed)
  Diagnosis: COVID-19  Physician: Dr. Wright  Procedure: bamlanivimab infusion Provided patient with bamlanivimab fact sheet for patients, parents and caregivers prior to infusion.  Complications: No immediate complications noted.  Discharge: Discharged home   Caroline Keller K Caroline Keller 03/13/2019   

## 2019-03-13 NOTE — Progress Notes (Signed)
Patient stable with no complications from IV med.

## 2019-04-30 ENCOUNTER — Other Ambulatory Visit: Payer: Self-pay | Admitting: Orthopaedic Surgery

## 2019-04-30 DIAGNOSIS — M25561 Pain in right knee: Secondary | ICD-10-CM

## 2019-04-30 DIAGNOSIS — G8929 Other chronic pain: Secondary | ICD-10-CM

## 2019-04-30 DIAGNOSIS — M4807 Spinal stenosis, lumbosacral region: Secondary | ICD-10-CM

## 2019-05-04 ENCOUNTER — Ambulatory Visit: Payer: PRIVATE HEALTH INSURANCE | Admitting: Orthopaedic Surgery

## 2019-05-18 ENCOUNTER — Other Ambulatory Visit: Payer: Self-pay | Admitting: Physician Assistant

## 2019-05-27 ENCOUNTER — Encounter: Payer: Self-pay | Admitting: Orthopaedic Surgery

## 2019-05-27 ENCOUNTER — Ambulatory Visit (INDEPENDENT_AMBULATORY_CARE_PROVIDER_SITE_OTHER): Payer: PRIVATE HEALTH INSURANCE | Admitting: Orthopaedic Surgery

## 2019-05-27 ENCOUNTER — Other Ambulatory Visit: Payer: Self-pay

## 2019-05-27 DIAGNOSIS — M5432 Sciatica, left side: Secondary | ICD-10-CM

## 2019-05-27 DIAGNOSIS — M4807 Spinal stenosis, lumbosacral region: Secondary | ICD-10-CM

## 2019-05-27 DIAGNOSIS — M7662 Achilles tendinitis, left leg: Secondary | ICD-10-CM | POA: Diagnosis not present

## 2019-05-27 NOTE — Progress Notes (Signed)
Office Visit Note   Patient: Caroline Keller           Date of Birth: 1961/03/09           MRN: [id number] Visit Date: 05/27/2019              Requested by: Jarrett Soho, PA-C [address] [place],  [place] [address] PCP: Jarrett Soho, PA-C   Assessment & Plan: Visit Diagnoses:  1. Achilles tendinitis, left leg   2. Spinal stenosis of lumbosacral region   3. Sciatica, left side     Plan: I showed the patient exercises for her Achilles tendon.  I have recommended Voltaren gel as well.  We gave her a Thera-Band to work on stretching.  She would like to see Dr. Alvester Morin for another lumbar spine intervention.  It has been a while since she has had a lumbar spine injection or intervention by Dr. Alvester Morin..  Based on her previous MRI most likely this needs to be at the L5-S1 level to the left side.  We will see her back in 4 weeks to see how she is doing overall.  All question concerns were answered and addressed.  Follow-Up Instructions: Return in about 4 weeks (around 06/24/2019).   Orders:  No orders of the defined types were placed in this encounter.  No orders of the defined types were placed in this encounter.     Procedures: No procedures performed   Clinical Data: No additional findings.   Subjective: No chief complaint on file. The comes in today with chief complaint of left low back pain and sciatica as well as left Achilles pain.  This is been worsening for some time for her.  She has had previous spine interventions by Dr. Alvester Morin.  She reports low back pain the left side that radiates to the back of her knee.  A MRI in 2018 did show a pars defect at the L5-S1 level but also a disc protrusion.  She is actually had interventions according to notes by Dr. Alvester Morin at the bilateral L3-L4 level in the past.  Its been a long time.  She also reports Achilles pain on the left side.  It hurts when she first gets up and take steps.  It is only in the posterior ankle and into  the calcaneus area.  She denies any numbness and tingling in her foot.  She does walk with a limp.  She denies any injury that she is aware of. HPI  Review of Systems She currently denies any headache, chest pain, shortness of breath, fever, chills, nausea, vomiting  Objective: Vital Signs: There were no vitals taken for this visit.  Physical Exam She is alert and orient x3 and in no acute distress Ortho Exam On examination she does have a positive straight leg raise to the left side.  She has pain in the lower lumbar spine to the sciatic region.  She has good strength in the bilateral lower extremities.  She has significant pain to palpation over the Achilles tendon but no significant swelling and a negative Thompson test. Specialty Comments:  No specialty comments available.  Imaging: No results found.   PMFS History: Patient Active Problem List   Diagnosis Date Noted  . Unilateral primary osteoarthritis, left knee 02/02/2019  . S/P arthroscopy of left knee 11/06/2018  . Unilateral primary osteoarthritis, right knee 03/19/2018  . Hyperlipidemia 02/25/2017  . Chronic left-sided low back pain with left-sided sciatica 09/13/2016  . Trigger thumb, left  thumb 09/13/2016  . Trigger thumb, right thumb 09/13/2016  . Substernal chest pain 01/03/2015  . PVC's (premature ventricular contractions) 03/23/2014  . PAC (premature atrial contraction) 03/23/2014  . Benign essential HTN 03/23/2014  . Hepatitis C 12/08/2013  . GERD (gastroesophageal reflux disease)    Past Medical History:  Diagnosis Date  . Anxiety   . Arthritis of low back   . GERD (gastroesophageal reflux disease)   . Hepatitis C   . History of C-section   . History of cholecystectomy   . History of hiatal hernia   . Hyperlipidemia   . Hypertension   . PAC (premature atrial contraction)   . Palpitations   . PVC (premature ventricular contraction)     Family History  Problem Relation Age of Onset  . Lung cancer  Mother   . Coronary artery disease Father   . Breast cancer Sister     Past Surgical History:  Procedure Laterality Date  . CARDIOVASCULAR STRESS TEST  01/04/2015  . CESAREAN SECTION    . CHOLECYSTECTOMY     Social History   Occupational History  . Not on file  Tobacco Use  . Smoking status: Former Smoker    Quit date: 04/16/1981    Years since quitting: 38.1  . Smokeless tobacco: Never Used  Substance and Sexual Activity  . Alcohol use: No  . Drug use: No  . Sexual activity: Not on file

## 2019-05-29 ENCOUNTER — Telehealth: Payer: Self-pay

## 2019-05-29 NOTE — Telephone Encounter (Signed)
Patient Caroline Keller stating she would like a return call about getting an appt to see Dr Alvester Morin for her back pain [phone]

## 2019-06-01 NOTE — Telephone Encounter (Signed)
I feel like there was a recent referral?

## 2019-06-01 NOTE — Telephone Encounter (Signed)
Bilateral L3 TF 04/14/18. Ok to repeat if helped, same problem/side, and no new injury?

## 2019-06-15 ENCOUNTER — Ambulatory Visit (INDEPENDENT_AMBULATORY_CARE_PROVIDER_SITE_OTHER): Payer: PRIVATE HEALTH INSURANCE | Admitting: Physical Medicine and Rehabilitation

## 2019-06-15 ENCOUNTER — Ambulatory Visit: Payer: Self-pay

## 2019-06-15 ENCOUNTER — Encounter: Payer: Self-pay | Admitting: Physical Medicine and Rehabilitation

## 2019-06-15 ENCOUNTER — Other Ambulatory Visit: Payer: Self-pay

## 2019-06-15 VITALS — BP 132/88 | HR 72

## 2019-06-15 DIAGNOSIS — M5416 Radiculopathy, lumbar region: Secondary | ICD-10-CM | POA: Diagnosis not present

## 2019-06-15 DIAGNOSIS — M5116 Intervertebral disc disorders with radiculopathy, lumbar region: Secondary | ICD-10-CM

## 2019-06-15 MED ORDER — METHYLPREDNISOLONE ACETATE 80 MG/ML IJ SUSP
40.0000 mg | Freq: Once | INTRAMUSCULAR | Status: AC
  Administered 2019-06-15: 40 mg

## 2019-06-15 NOTE — Progress Notes (Signed)
  Numeric Pain Rating Scale and Functional Assessment Average Pain (5)   In the last MONTH (on 0-10 scale) has pain interfered with the following?  1. General activity like being  able to carry out your everyday physical activities such as walking, climbing stairs, carrying groceries, or moving a chair?  Rating(5)   +Driver, -BT, -Dye Allergies.  

## 2019-06-16 NOTE — Procedures (Signed)
Lumbosacral Transforaminal Epidural Steroid Injection - Sub-Pedicular Approach with Fluoroscopic Guidance  Patient: Caroline Keller      Date of Birth: 04/22/1960 MRN: [id number] PCP: Jarrett Soho, PA-C      Visit Date: 06/15/2019   Universal Protocol:    Date/Time: 06/15/2019  Consent Given By: the patient  Position: PRONE  Additional Comments: Vital signs were monitored before and after the procedure. Patient was prepped and draped in the usual sterile fashion. The correct patient, procedure, and site was verified.   Injection Procedure Details:  Procedure Site One Meds Administered:  Meds ordered this encounter  Medications  . methylPREDNISolone acetate (DEPO-MEDROL) injection 40 mg    Laterality: Left  Location/Site:  L5-S1 S1-2  Needle size: 22 G  Needle type: Spinal  Needle Placement: Transforaminal  Findings:    -Comments: Excellent flow of contrast along the nerve and into the epidural space.  Procedure Details: After squaring off the end-plates to get a true AP view, the C-arm was positioned so that an oblique view of the foramen as noted above was visualized. The target area is just inferior to the "nose of the scotty dog" or sub pedicular. The soft tissues overlying this structure were infiltrated with 2-3 ml. of 1% Lidocaine without Epinephrine.  The spinal needle was inserted toward the target using a "trajectory" view along the fluoroscope beam.  Under AP and lateral visualization, the needle was advanced so it did not puncture dura and was located close the 6 O'Clock position of the pedical in AP tracterory. Biplanar projections were used to confirm position. Aspiration was confirmed to be negative for CSF and/or blood. A 1-2 ml. volume of Isovue-250 was injected and flow of contrast was noted at each level. Radiographs were obtained for documentation purposes.   After attaining the desired flow of contrast documented above, a 0.5 to 1.0 ml test dose of  0.25% Marcaine was injected into each respective transforaminal space.  The patient was observed for 90 seconds post injection.  After no sensory deficits were reported, and normal lower extremity motor function was noted,   the above injectate was administered so that equal amounts of the injectate were placed at each foramen (level) into the transforaminal epidural space.   Additional Comments:  The patient tolerated the procedure well Dressing: 2 x 2 sterile gauze and Band-Aid    Post-procedure details: Patient was observed during the procedure. Post-procedure instructions were reviewed.  Patient left the clinic in stable condition.

## 2019-06-16 NOTE — Progress Notes (Signed)
Caroline Keller - 59 y.o. female MRN [id number]  Date of birth: 1961/01/28  Office Visit Note: Visit Date: 06/15/2019 PCP: Marda Stalker, PA-C Referred by: Marda Stalker, PA-C  Subjective: Chief Complaint  Patient presents with  . Lower Back - Pain  . Left Leg - Pain   HPI: Caroline Keller is a 59 y.o. female who comes in today For planned left L5 and S1 transforaminal epidural steroid injection.  Patient is followed by Dr. Jean Rosenthal.  She has a history of off-and-on intermittent left-sided radiculitis radiculopathy.  She has known disc herniation at L5-S1 which is left paracentral but also facet joint arthritic change on the left with probable pars defect.  Her symptoms today are little bit more L5 in distribution.  She reports 4 months of worsening severe pain which is limiting to her.  Prior to that she was doing quite well.  No specific trauma.  Prior injection was very beneficial a few years ago.  MRI was reviewed with her.  She does have an updated MRI from 2019.  ROS Otherwise per HPI.  Assessment & Plan: Visit Diagnoses:  1. Lumbar radiculopathy   2. Radiculopathy due to lumbar intervertebral disc disorder     Plan: No additional findings.   Meds & Orders:  Meds ordered this encounter  Medications  . methylPREDNISolone acetate (DEPO-MEDROL) injection 40 mg    Orders Placed This Encounter  Procedures  . XR C-ARM NO REPORT  . Epidural Steroid injection    Follow-up: Return if symptoms worsen or fail to improve.   Procedures: No procedures performed  Lumbosacral Transforaminal Epidural Steroid Injection - Sub-Pedicular Approach with Fluoroscopic Guidance  Patient: Caroline Keller      Date of Birth: 08/30/1960 MRN: [id number] PCP: Marda Stalker, PA-C      Visit Date: 06/15/2019   Universal Protocol:    Date/Time: 06/15/2019  Consent Given By: the patient  Position: PRONE  Additional Comments: Vital signs were monitored before and after the  procedure. Patient was prepped and draped in the usual sterile fashion. The correct patient, procedure, and site was verified.   Injection Procedure Details:  Procedure Site One Meds Administered:  Meds ordered this encounter  Medications  . methylPREDNISolone acetate (DEPO-MEDROL) injection 40 mg    Laterality: Left  Location/Site:  L5-S1 S1-2  Needle size: 22 G  Needle type: Spinal  Needle Placement: Transforaminal  Findings:    -Comments: Excellent flow of contrast along the nerve and into the epidural space.  Procedure Details: After squaring off the end-plates to get a true AP view, the C-arm was positioned so that an oblique view of the foramen as noted above was visualized. The target area is just inferior to the "nose of the scotty dog" or sub pedicular. The soft tissues overlying this structure were infiltrated with 2-3 ml. of 1% Lidocaine without Epinephrine.  The spinal needle was inserted toward the target using a "trajectory" view along the fluoroscope beam.  Under AP and lateral visualization, the needle was advanced so it did not puncture dura and was located close the 6 O'Clock position of the pedical in AP tracterory. Biplanar projections were used to confirm position. Aspiration was confirmed to be negative for CSF and/or blood. A 1-2 ml. volume of Isovue-250 was injected and flow of contrast was noted at each level. Radiographs were obtained for documentation purposes.   After attaining the desired flow of contrast documented above, a 0.5 to 1.0 ml test dose of 0.25% Marcaine was injected  into each respective transforaminal space.  The patient was observed for 90 seconds post injection.  After no sensory deficits were reported, and normal lower extremity motor function was noted,   the above injectate was administered so that equal amounts of the injectate were placed at each foramen (level) into the transforaminal epidural space.   Additional Comments:  The  patient tolerated the procedure well Dressing: 2 x 2 sterile gauze and Band-Aid    Post-procedure details: Patient was observed during the procedure. Post-procedure instructions were reviewed.  Patient left the clinic in stable condition.     Clinical History: Market researcher, Incoming Rad Results - 03/18/2018 1:38 PM EST   FINDINGS:   Bones:  No suspicious osseous lesions. No acute fracture.   ALIGNMENT:  # Trace anterolisthesis of T11 on T12.  # Trace retrolisthesis of L1 on L2.  # Trace retrolisthesis of L2 on L3.  # Trace retrolisthesis of L3 on L4.  # Trace retrolisthesis of L4 and L5.  # Trace retrolisthesis of L5 on S1.  # Dextroconvex curvature at T12-L1.  # Levoconvex curvature at L3-L4.   DISC LEVELS:   T12-L1: No significant central or foraminal stenosis.   L1-L2: Mild degenerative disc disease. Mild disc bulge. Small left foraminal disc protrusion. No significant central or foraminal stenosis.   L2-L3: Mild degenerative disc disease. Uncovered/bulging disc extending into bilateral foramen. Mild facet degenerative changes. Trace left facet effusion. No significant central or foraminal stenosis.    L3-L4: Mild degenerative disc disease. Mild facet degenerative changes/ligamentum flavum thickening. Small bilateral facet effusions. Uncovered/diffusely bulging disc extending into the foramen. Mild bilateral foraminal stenosis. Mild central stenosis.   L4-L5: Mild degenerative disc disease. Mild diffuse disc bulge. Mild facet degenerative changes/ligamentum flavum thickening. Mild bilateral foraminal stenosis. Mild central stenosis.   L5-S1: Mild degenerative disc disease. Mild diffuse disc bulge and endplate spurring extending into bilateral foramen. Broad left posterolateral disc protrusion. Mild/moderate left foraminal stenosis. No right foraminal stenosis. No central stenosis.   SPINAL CORD:  No abnormal signal is demonstrated within the visualized  distal spinal cord. Cauda equina appears unremarkable. Conus medullaris terminates at L1.   PARASPINAL TISSUES:  Intact.   VISUALIZED SACRUM AND LOWER THORACIC SPINE:  No significant abnormality.   INCIDENTAL:  T2 hyperintense focus in the anterior right kidney is incompletely imaged/characterized but may relate to cyst.  Small fat-containing umbilical hernia.    IMPRESSION:  1. Multilevel degenerative changes of the lumbar spine, as detailed above.  2. Foraminal stenosis is most pronounced and mild/moderate on the left at L5-S1 as result of a left posterolateral disc protrusion and endplate spurring.  3. Mild central stenosis at L3-L4 and L4-L5.   Electronically Signed by: Maurene Capes ---- Lspine MRI L3-L4: 1 mm retrolisthesis. Annular rent with subligamentous protrusion extends to the RIGHT. Facet arthropathy and ligamentum flavum hypertrophy. No definite impingement. BILATERAL facet joint effusions.  L4-L5: Central and leftward protrusion. Asymmetric facet arthropathy and ligamentum flavum hypertrophy, also on the LEFT. Disc material extends to the foramen. LEFT L5 nerve root impingement likely.  L5-S1: Unilateral LEFT L5 pars defect, with regional bone marrow edema. There is no subluxation. Shallow central and leftward protrusion. Possible LEFT L5 and/or LEFT S1 nerve root impingement.  Compared with most recent priors, 04/28/2013, a LEFT pars is fracture was probably present previously, but there is much more osseous edema, and widening of the defect on today's exam.  IMPRESSION: LEFT L5 pars interarticularis defect, with regional bone marrow edema,  new/increased from priors. No subluxation at L5-S1, but a central and leftward protrusion could affect the LEFT L5 and/or LEFT S1 nerve roots.  Central and leftward protrusion at L4-5, with asymmetric posterior element hypertrophy on that side. LEFT L5 nerve root impingement likely.  Lumbar spondylosis at  L2-3 and L3-4, without definite impingement.  If further evaluation is desired, consider lumbar myelography with postmyelogram CT, with upright bending films to evaluate for dynamic instability, and the relative contributions of L4-5 and L5-S1 to the patient's LEFT leg symptomatology.   Electronically Signed   By: Elsie Stain M.D.   On: 09/09/2016 12:55   She reports that she quit smoking about 38 years ago. She has never used smokeless tobacco. No results for input(s): HGBA1C, LABURIC in the last 8760 hours.  Objective:  VS:  HT:    WT:   BMI:     BP:132/88  HR:72bpm  TEMP: ( )  RESP:  Physical Exam  Ortho Exam Imaging: XR C-ARM NO REPORT  Result Date: 06/15/2019 Please see Notes tab for imaging impression.   Past Medical/Family/Surgical/Social History: Medications & Allergies reviewed per EMR, new medications updated. Patient Active Problem List   Diagnosis Date Noted  . Unilateral primary osteoarthritis, left knee 02/02/2019  . S/P arthroscopy of left knee 11/06/2018  . Unilateral primary osteoarthritis, right knee 03/19/2018  . Hyperlipidemia 02/25/2017  . Chronic left-sided low back pain with left-sided sciatica 09/13/2016  . Trigger thumb, left thumb 09/13/2016  . Trigger thumb, right thumb 09/13/2016  . Substernal chest pain 01/03/2015  . PVC's (premature ventricular contractions) 03/23/2014  . PAC (premature atrial contraction) 03/23/2014  . Benign essential HTN 03/23/2014  . Hepatitis C 12/08/2013  . GERD (gastroesophageal reflux disease)    Past Medical History:  Diagnosis Date  . Anxiety   . Arthritis of low back   . GERD (gastroesophageal reflux disease)   . Hepatitis C   . History of C-section   . History of cholecystectomy   . History of hiatal hernia   . Hyperlipidemia   . Hypertension   . PAC (premature atrial contraction)   . Palpitations   . PVC (premature ventricular contraction)    Family History  Problem Relation Age of Onset  .  Lung cancer Mother   . Coronary artery disease Father   . Breast cancer Sister    Past Surgical History:  Procedure Laterality Date  . CARDIOVASCULAR STRESS TEST  01/04/2015  . CESAREAN SECTION    . CHOLECYSTECTOMY     Social History   Occupational History  . Not on file  Tobacco Use  . Smoking status: Former Smoker    Quit date: 04/16/1981    Years since quitting: 38.1  . Smokeless tobacco: Never Used  Substance and Sexual Activity  . Alcohol use: No  . Drug use: No  . Sexual activity: Not on file

## 2019-06-16 NOTE — Progress Notes (Signed)
Cardiology Office Note:    Date:  06/17/2019   ID:  Caroline Keller, DOB Oct 16, 1960, MRN [id number]  PCP:  Jarrett Soho, PA-C  Cardiologist:  Armanda Magic, MD    Referring MD: Jarrett Soho, PA-C   Chief Complaint  Patient presents with  . Follow-up    PVCs, PACs, HLD, HTN    History of Present Illness:    Caroline Keller is a 59 y.o. female with a hx of HTN, PVC/PACs, HLD, treated hepatitis C, hiatal hernia . She has history of palpitations with stress/anxiety which have been treated with diltiazem. Nuc in 2016 was normal, EF 59%. She saw Arelia Longest NP 02/2018 for routine follow-up and it was noted that she had both high BP and high cholesterol. Micardis was increased to 80/12.5mg  daily and she was started on atorastatin 20mg .  Seen a year ago by , PA and was doing well and no changes were made.   She is here today for followup and is doing well.  She denies any chest pain or pressure, SOB, DOE, PND, orthopnea, LE edema, dizziness, palpitations or syncope. She is compliant with her meds and is tolerating meds with no SE.    Past Medical History:  Diagnosis Date  . Anxiety   . Arthritis of low back   . GERD (gastroesophageal reflux disease)   . Hepatitis C   . History of C-section   . History of cholecystectomy   . History of hiatal hernia   . Hyperlipidemia   . Hypertension   . PAC (premature atrial contraction)   . Palpitations   . PVC (premature ventricular contraction)     Past Surgical History:  Procedure Laterality Date  . CARDIOVASCULAR STRESS TEST  01/04/2015  . CESAREAN SECTION    . CHOLECYSTECTOMY      Current Medications: Current Meds  Medication Sig  . atorvastatin (LIPITOR) 10 MG tablet Take 1 tablet (10 mg total) by mouth daily.  . calcium carbonate 1250 MG capsule Take 1,250 mg by mouth 2 (two) times daily with a meal.  . clonazePAM (KLONOPIN) 1 MG tablet Take 1 tablet at bedtime by mouth. AS NEEDED FOR SLEEP  . diltiazem (CARDIZEM CD) 360 MG  24 hr capsule TAKE 1 CAPSULE (360 MG TOTAL) EVERY MORNING BY MOUTH.  01/06/2015 escitalopram (LEXAPRO) 20 MG tablet Take 20 mg by mouth daily.  Marland Kitchen omeprazole (PRILOSEC) 20 MG capsule Take 20 mg by mouth 2 (two) times daily.  Marland Kitchen telmisartan-hydrochlorothiazide (MICARDIS HCT) 80-25 MG tablet Take 1 tablet by mouth daily. Please keep upcoming appt in March with Dr. April for future refills. Thank you     Allergies:   Patient has no known allergies.   Social History   Socioeconomic History  . Marital status: Married    Spouse name: Not on file  . Number of children: Not on file  . Years of education: Not on file  . Highest education level: Not on file  Occupational History  . Not on file  Tobacco Use  . Smoking status: Former Smoker    Quit date: 04/16/1981    Years since quitting: 38.1  . Smokeless tobacco: Never Used  Substance and Sexual Activity  . Alcohol use: No  . Drug use: No  . Sexual activity: Not on file  Other Topics Concern  . Not on file  Social History Narrative  . Not on file   Social Determinants of Health   Financial Resource Strain:   . Difficulty of Paying Living  Expenses: Not on file  Food Insecurity:   . Worried About Programme researcher, broadcasting/film/video in the Last Year: Not on file  . Ran Out of Food in the Last Year: Not on file  Transportation Needs:   . Lack of Transportation (Medical): Not on file  . Lack of Transportation (Non-Medical): Not on file  Physical Activity:   . Days of Exercise per Week: Not on file  . Minutes of Exercise per Session: Not on file  Stress:   . Feeling of Stress : Not on file  Social Connections:   . Frequency of Communication with Friends and Family: Not on file  . Frequency of Social Gatherings with Friends and Family: Not on file  . Attends Religious Services: Not on file  . Active Member of Clubs or Organizations: Not on file  . Attends Banker Meetings: Not on file  . Marital Status: Not on file     Family History: The  patient's family history includes Breast cancer in her sister; Coronary artery disease in her father; Lung cancer in her mother.  ROS:   Please see the history of present illness.    ROS  All other systems reviewed and negative.   EKGs/Labs/Other Studies Reviewed:    The following studies were reviewed today:  EKG  EKG:  EKG is  ordered today.  The ekg ordered today demonstrates NSR at 68bpm with normal intervals and no ST changes  Recent Labs: No results found for requested labs within last 8760 hours.   Recent Lipid Panel    Component Value Date/Time   CHOL 161 05/28/2018 0854   TRIG 97 05/28/2018 0854   HDL 68 05/28/2018 0854   CHOLHDL 2.4 05/28/2018 0854   CHOLHDL 3.3 01/04/2015 0211   VLDL 7 01/04/2015 0211   LDLCALC 74 05/28/2018 0854    Physical Exam:    VS:  BP (!) 142/86   Pulse 68   Ht 5\' 4"  (1.626 m)   Wt 209 lb 12.8 oz (95.2 kg)   BMI 36.01 kg/m     Wt Readings from Last 3 Encounters:  06/17/19 209 lb 12.8 oz (95.2 kg)  03/13/19 205 lb (93 kg)  01/05/19 195 lb (88.5 kg)     GEN:  Well nourished, well developed in no acute distress HEENT: Normal NECK: No JVD; No carotid bruits LYMPHATICS: No lymphadenopathy CARDIAC: RRR, no murmurs, rubs, gallops RESPIRATORY:  Clear to auscultation without rales, wheezing or rhonchi  ABDOMEN: Soft, non-tender, non-distended MUSCULOSKELETAL:  No edema; No deformity  SKIN: Warm and dry NEUROLOGIC:  Alert and oriented x 3 PSYCHIATRIC:  Normal affect   ASSESSMENT:    1. Benign essential HTN   2. Pure hypercholesterolemia   3. PVC's (premature ventricular contractions)    PLAN:    In order of problems listed above:  1.  HTN -BP controlled -continue Diltiazem CD 360mg  daily, Micardis HCT 80-24mg  daily -Creatinine was 0.88 and K+ 4 a year ago -repeat CMET today  2.  HLD -LDL goal < 100 -check FLP today -continue atorvastatin 10mg  daily  3.  PVCs and PACs -she has not had any palpitations on  CCB -continue Diltiazem CD 360mg  daily   Medication Adjustments/Labs and Tests Ordered: Current medicines are reviewed at length with the patient today.  Concerns regarding medicines are outlined above.  Orders Placed This Encounter  Procedures  . EKG 12-Lead   No orders of the defined types were placed in this encounter.   Signed, 01/07/19  Radford Pax, MD  06/17/2019 11:10 AM    [place]

## 2019-06-17 ENCOUNTER — Ambulatory Visit (INDEPENDENT_AMBULATORY_CARE_PROVIDER_SITE_OTHER): Payer: PRIVATE HEALTH INSURANCE | Admitting: Cardiology

## 2019-06-17 ENCOUNTER — Encounter: Payer: Self-pay | Admitting: Cardiology

## 2019-06-17 ENCOUNTER — Other Ambulatory Visit: Payer: Self-pay

## 2019-06-17 VITALS — BP 142/86 | HR 68 | Ht 64.0 in | Wt 209.8 lb

## 2019-06-17 DIAGNOSIS — E7800 Pure hypercholesterolemia, unspecified: Secondary | ICD-10-CM

## 2019-06-17 DIAGNOSIS — I493 Ventricular premature depolarization: Secondary | ICD-10-CM | POA: Diagnosis not present

## 2019-06-17 DIAGNOSIS — I10 Essential (primary) hypertension: Secondary | ICD-10-CM | POA: Diagnosis not present

## 2019-06-17 LAB — LIPID PANEL
Chol/HDL Ratio: 2.2 ratio (ref 0.0–4.4)
Cholesterol, Total: 179 mg/dL (ref 100–199)
HDL: 83 mg/dL (ref >39)
LDL Chol Calc (NIH): 85 mg/dL (ref 0–99)
Triglycerides: 59 mg/dL (ref 0–149)
VLDL Cholesterol Cal: 11 mg/dL (ref 5–40)

## 2019-06-17 LAB — COMPREHENSIVE METABOLIC PANEL
ALT: 8 IU/L (ref 0–32)
AST: 19 IU/L (ref 0–40)
Albumin/Globulin Ratio: 1.8 (ref 1.2–2.2)
Albumin: 4.9 g/dL (ref 3.8–4.9)
Alkaline Phosphatase: 105 IU/L (ref 39–117)
BUN/Creatinine Ratio: 24 (ref 12–28)
BUN: 23 mg/dL (ref 8–27)
Bilirubin Total: 0.4 mg/dL (ref 0.0–1.2)
CO2: 23 mmol/L (ref 20–29)
Calcium: 9.7 mg/dL (ref 8.7–10.3)
Chloride: 101 mmol/L (ref 96–106)
Creatinine, Ser: 0.96 mg/dL (ref 0.57–1.00)
GFR calc Af Amer: 74 mL/min/{1.73_m2} (ref >59)
GFR calc non Af Amer: 64 mL/min/{1.73_m2} (ref >59)
Globulin, Total: 2.7 g/dL (ref 1.5–4.5)
Glucose: 99 mg/dL (ref 65–99)
Potassium: 4.2 mmol/L (ref 3.5–5.2)
Sodium: 139 mmol/L (ref 134–144)
Total Protein: 7.6 g/dL (ref 6.0–8.5)

## 2019-06-17 LAB — CBC
Hematocrit: 37 % (ref 34.0–46.6)
Hemoglobin: 13.1 g/dL (ref 11.1–15.9)
MCH: 32.8 pg (ref 26.6–33.0)
MCHC: 35.4 g/dL (ref 31.5–35.7)
MCV: 93 fL (ref 79–97)
Platelets: 262 10*3/uL (ref 150–450)
RBC: 3.99 x10E6/uL (ref 3.77–5.28)
RDW: 12.2 % (ref 11.7–15.4)
WBC: 6.6 10*3/uL (ref 3.4–10.8)

## 2019-06-17 MED ORDER — DILTIAZEM HCL ER COATED BEADS 360 MG PO CP24: ORAL_CAPSULE | ORAL | 3 refills | Status: DC

## 2019-06-17 MED ORDER — ATORVASTATIN CALCIUM 10 MG PO TABS: 10.0000 mg | ORAL_TABLET | Freq: Every day | ORAL | 3 refills | Status: DC

## 2019-06-17 MED ORDER — TELMISARTAN-HCTZ 80-25 MG PO TABS: 1.0000 | ORAL_TABLET | Freq: Every day | ORAL | 3 refills | Status: DC

## 2019-06-17 NOTE — Patient Instructions (Signed)
Medication Instructions:  Your physician recommends that you continue on your current medications as directed. Please refer to the Current Medication list given to you today.  *If you need a refill on your cardiac medications before your next appointment, please call your pharmacy*   Lab Work: TODAY: Fasting lipids, CMET, CBC If you have labs (blood work) drawn today and your tests are completely normal, you will receive your results only by: Marland Kitchen MyChart Message (if you have MyChart) OR . A paper copy in the mail If you have any lab test that is abnormal or we need to change your treatment, we will call you to review the results.   Follow-Up: At [facility], you and your health needs are our priority.  As part of our continuing mission to provide you with exceptional heart care, we have created designated Provider Care Teams.  These Care Teams include your primary Cardiologist (physician) and Advanced Practice Providers (APPs -  Physician Assistants and Nurse Practitioners) who all work together to provide you with the care you need, when you need it.  We recommend signing up for the patient portal called "MyChart".  Sign up information is provided on this After Visit Summary.  MyChart is used to connect with patients for Virtual Visits (Telemedicine).  Patients are able to view lab/test results, encounter notes, upcoming appointments, etc.  Non-urgent messages can be sent to your provider as well.   To learn more about what you can do with MyChart, go to [email or web address].    Your next appointment:   1 year(s)  The format for your next appointment:   Either In Person or Virtual  Provider:   Armanda Magic, MD

## 2019-06-19 ENCOUNTER — Telehealth: Payer: Self-pay | Admitting: Family Medicine

## 2019-06-19 NOTE — Telephone Encounter (Signed)
Patient called and requested to speak with Dr. Joya Gaskins. Patient stated that she met Dr Joya Gaskins around thanksgiving and wanted his medical opinion regarding her getting the Covid vaccine. Please follow up at your earliest convenience.

## 2019-06-21 ENCOUNTER — Ambulatory Visit: Payer: PRIVATE HEALTH INSURANCE | Attending: Internal Medicine

## 2019-06-21 DIAGNOSIS — Z23 Encounter for immunization: Secondary | ICD-10-CM | POA: Insufficient documentation

## 2019-06-21 NOTE — Progress Notes (Signed)
   Covid-19 Vaccination Clinic  Name:  Caroline Keller    MRN: [id number] DOB: Jun 03, 1960  06/21/2019  [name] was observed post Covid-19 immunization for 15 minutes without incident. She was provided with Vaccine Information Sheet and instruction to access the V-Safe system.   [name] was instructed to call 911 with any severe reactions post vaccine: Marland Kitchen Difficulty breathing  . Swelling of face and throat  . A fast heartbeat  . A bad rash all over body  . Dizziness and weakness   Immunizations Administered    Name Date Dose VIS Date Route   Pfizer COVID-19 Vaccine 06/21/2019 12:54 PM 0.3 mL 03/27/2019 Intramuscular   Manufacturer: [employer], [employer]   Lot: [id number]   NDC: [address]-1

## 2019-06-22 NOTE — Telephone Encounter (Signed)
I connected with the patient and gave her advice to receive the vaccine now as she is > 90days out from her Covid illness

## 2019-06-25 ENCOUNTER — Ambulatory Visit: Payer: PRIVATE HEALTH INSURANCE | Admitting: Orthopaedic Surgery

## 2019-07-12 ENCOUNTER — Ambulatory Visit: Payer: PRIVATE HEALTH INSURANCE | Attending: Internal Medicine

## 2019-07-12 DIAGNOSIS — Z23 Encounter for immunization: Secondary | ICD-10-CM

## 2019-07-12 NOTE — Progress Notes (Signed)
   Covid-19 Vaccination Clinic  Name:  Caroline Keller    MRN: [id number] DOB: 02/16/1961  07/12/2019  Caroline Keller was observed post Covid-19 immunization for 15 minutes without incident. She was provided with Vaccine Information Sheet and instruction to access the V-Safe system.   Caroline Keller was instructed to call 911 with any severe reactions post vaccine: Marland Kitchen Difficulty breathing  . Swelling of face and throat  . A fast heartbeat  . A bad rash all over body  . Dizziness and weakness   Immunizations Administered    Name Date Dose VIS Date Route   Pfizer COVID-19 Vaccine 07/12/2019 10:48 AM 0.3 mL 03/27/2019 Intramuscular   Manufacturer: [employer], [employer]   Lot: [id number]   NDC: [address]-1

## 2019-08-21 ENCOUNTER — Encounter: Payer: Self-pay | Admitting: Gastroenterology

## 2019-09-23 ENCOUNTER — Ambulatory Visit: Payer: PRIVATE HEALTH INSURANCE | Admitting: Gastroenterology

## 2019-09-23 ENCOUNTER — Encounter: Payer: Self-pay | Admitting: Gastroenterology

## 2019-09-23 VITALS — BP 124/78 | HR 80 | Ht 65.0 in | Wt 208.2 lb

## 2019-09-23 DIAGNOSIS — K219 Gastro-esophageal reflux disease without esophagitis: Secondary | ICD-10-CM | POA: Diagnosis not present

## 2019-09-23 NOTE — Patient Instructions (Signed)
If you are age 61 or older, your body mass index should be between 23-30. Your Body mass index is 34.65 kg/m. If this is out of the aforementioned range listed, please consider follow up with your Primary Care Provider.  If you are age 7 or younger, your body mass index should be between 19-25. Your Body mass index is 34.65 kg/m. If this is out of the aformentioned range listed, please consider follow up with your Primary Care Provider.   We will obtain records from [facility].  You will follow up with our office on an as needed basis or if symptoms worsen or fail to improve.  Thank you for entrusting me with your care and choosing [facility].  Dr Christella Hartigan

## 2019-09-23 NOTE — Progress Notes (Signed)
HPI: This is a very pleasant 59 year old woman who was referred to me by Jarrett Soho, PA-C  to evaluate GERD, previous hepatitis C.    Former patient at [facility] gastroenterology.  She has undergone 2 colonoscopies and 2 upper endoscopies with them.  They also helped her with hepatitis C treatment many years ago.  She was very happy with her care.  She is switching to [facility] because they no longer accept her insurance plan.  She is really here just to establish care.  She has chronic GERD under good control without alarm symptoms while taking proton pump inhibitor 20 mg Protonix once daily.  She has gained 10 to 15 pounds in the past year.  She was treated for hepatitis C with an 8 to 12-week regimen from what she recalls and this resulted in complete response.  She thinks that she was supposed to have another blood test for hepatitis C at some point in the future.  She has had 2 colonoscopies and she believes they were both completely normal.  The last one was 5 or 6 years ago from what she recalls  Blood work March 2021 showed normal CBC, normal complete metabolic profile.   Review of systems: Pertinent positive and negative review of systems were noted in the above HPI section. All other review negative.   Past Medical History:  Diagnosis Date  . Anxiety   . Arthritis of low back   . GERD (gastroesophageal reflux disease)   . Hepatitis C   . History of C-section   . History of cholecystectomy   . History of hiatal hernia   . Hyperlipidemia   . Hypertension   . PAC (premature atrial contraction)   . Palpitations   . PVC (premature ventricular contraction)     Past Surgical History:  Procedure Laterality Date  . CARDIOVASCULAR STRESS TEST  01/04/2015  . CESAREAN SECTION    . CHOLECYSTECTOMY    . KNEE SURGERY  10/2018    Current Outpatient Medications  Medication Sig Dispense Refill  . atorvastatin (LIPITOR) 10 MG tablet Take 1 tablet (10 mg total) by mouth  daily. 90 tablet 3  . calcium carbonate 1250 MG capsule Take 1,250 mg by mouth 2 (two) times daily with a meal.    . clonazePAM (KLONOPIN) 1 MG tablet Take 1 tablet at bedtime by mouth. AS NEEDED FOR SLEEP  0  . diltiazem (CARDIZEM CD) 360 MG 24 hr capsule TAKE 1 CAPSULE (360 MG TOTAL) EVERY MORNING BY MOUTH. 90 capsule 3  . escitalopram (LEXAPRO) 20 MG tablet Take 20 mg by mouth daily.  1  . pantoprazole (PROTONIX) 20 MG tablet Take 20 mg by mouth daily.    Marland Kitchen telmisartan-hydrochlorothiazide (MICARDIS HCT) 80-25 MG tablet Take 1 tablet by mouth daily. 90 tablet 3   No current facility-administered medications for this visit.    Allergies as of 09/23/2019  . (No Known Allergies)    Family History  Problem Relation Age of Onset  . Lung cancer Mother   . Coronary artery disease Father   . Breast cancer Sister   . Colon cancer Neg Hx   . Rectal cancer Neg Hx   . Stomach cancer Neg Hx   . Pancreatic cancer Neg Hx   . Esophageal cancer Neg Hx     Social History   Socioeconomic History  . Marital status: Married    Spouse name: Not on file  . Number of children: Not on file  . Years  of education: Not on file  . Highest education level: Not on file  Occupational History  . Not on file  Tobacco Use  . Smoking status: Former Smoker    Quit date: 04/16/1981    Years since quitting: 38.4  . Smokeless tobacco: Never Used  Substance and Sexual Activity  . Alcohol use: No  . Drug use: No  . Sexual activity: Not on file  Other Topics Concern  . Not on file  Social History Narrative  . Not on file   Social Determinants of Health   Financial Resource Strain:   . Difficulty of Paying Living Expenses:   Food Insecurity:   . Worried About Charity fundraiser in the Last Year:   . Arboriculturist in the Last Year:   Transportation Needs:   . Film/video editor (Medical):   Marland Kitchen Lack of Transportation (Non-Medical):   Physical Activity:   . Days of Exercise per Week:   .  Minutes of Exercise per Session:   Stress:   . Feeling of Stress :   Social Connections:   . Frequency of Communication with Friends and Family:   . Frequency of Social Gatherings with Friends and Family:   . Attends Religious Services:   . Active Member of Clubs or Organizations:   . Attends Archivist Meetings:   Marland Kitchen Marital Status:   Intimate Partner Violence:   . Fear of Current or Ex-Partner:   . Emotionally Abused:   Marland Kitchen Physically Abused:   . Sexually Abused:      Physical Exam: BP 124/78   Pulse 80   Ht 5\' 5"  (1.651 m)   Wt 208 lb 4 oz (94.5 kg)   BMI 34.65 kg/m  Constitutional: generally well-appearing Psychiatric: alert and oriented x3 Eyes: extraocular movements intact Mouth: oral pharynx moist, no lesions Neck: supple no lymphadenopathy Cardiovascular: heart regular rate and rhythm Lungs: clear to auscultation bilaterally Abdomen: soft, nontender, nondistended, no obvious ascites, no peritoneal signs, normal bowel sounds Extremities: no lower extremity edema bilaterally Skin: no lesions on visible extremities   Assessment and plan: 59 y.o. female with chronic GERD, previous hepatitis C  First she has chronic GERD without alarm symptoms while taking proton pump inhibitor 20 mg Protonix once daily.  She will continue that indefinitely for now.  I see no reason for repeat endoscopic testing at this point since her symptoms are under such good control and she has no alarm symptoms.  She believes she has a prescription for the last for about a year.  She understands I am happy to refill that in 1 year from now however if she still needs prescription medicines in 2 years I would like to see her back in the office.  We will get records from her previous gastroenterologist about her colonoscopy reports, endoscopy reports, also her hepatitis C treatment.  She believes she was supposed to get repeat hepatitis C testing at some point in the future.  After reviewing  her records I will get back to her about that.    Please see the "Patient Instructions" section for addition details about the plan.   Owens Loffler, MD [facility] 09/23/2019, 11:09 AM  Cc: Marda Stalker, PA-C  Total time on date of encounter was 40  minutes (this included time spent preparing to see the patient reviewing records; obtaining and/or reviewing separately obtained history; performing a medically appropriate exam and/or evaluation; counseling and educating the patient and family if present;  ordering medications, tests or procedures if applicable; and documenting clinical information in the health record).

## 2019-09-29 ENCOUNTER — Telehealth: Payer: Self-pay | Admitting: Gastroenterology

## 2019-09-29 NOTE — Telephone Encounter (Signed)
The pt recall has been entered.  Call placed to [facility] and requested Hep C labs to be faxed to Dr Christella Hartigan attention.

## 2019-09-29 NOTE — Telephone Encounter (Signed)
   Colonoscopy September 2018, Dr. Vilinda Boehringer.  Indication "screening for colorectal malignant neoplasm, last colonoscopy January 2007".  Findings internal hemorrhoids.  The examination was otherwise normal.  She was recommended to have repeat colonoscopy in 10 years for screening purposes  In this packet of information there were no included records regarding her hepatitis C treatment.   Patty, Can you please put her in our recall system for a colonoscopy September 2028 for routine risk screening.  Thank you.  Can you also please reach out to [facility] gastroenterology.  She was under the assumption that she needed another hepatitis C test.  Can you ask for all of her hepatitis C laboratory testing results.  Thank you

## 2019-10-13 ENCOUNTER — Telehealth: Payer: Self-pay | Admitting: Gastroenterology

## 2019-10-13 DIAGNOSIS — B1920 Unspecified viral hepatitis C without hepatic coma: Secondary | ICD-10-CM

## 2019-10-13 NOTE — Telephone Encounter (Signed)
Reviewed records from [facility] gastroenterology.  Hepatitis C virus PCR negative May 2018.  She was instructed to stop Harvoni.  Hepatitis C virus PCR negative June 2018.  She was instructed to repeat this in 3 months.  Hepatitis C virus PCR September 2018 negative.  She was instructed to repeat this at 42-month interval.    Please let her know I finally received the records from [facility] gastroenterology, she needs hepatitis C virus PCR now.  If it is negative then she needs no further testing for hepatitis C virus.  Thank you

## 2019-10-13 NOTE — Telephone Encounter (Signed)
The pt has been advised and lab order entered.  She will come at her convenience

## 2019-10-26 ENCOUNTER — Other Ambulatory Visit: Payer: PRIVATE HEALTH INSURANCE

## 2019-10-26 DIAGNOSIS — B1920 Unspecified viral hepatitis C without hepatic coma: Secondary | ICD-10-CM

## 2019-10-29 LAB — HEPATITIS C RNA QUANTITATIVE
HCV Quantitative Log: <1.18 Log IU/mL
HCV RNA, PCR, QN: <15 IU/mL

## 2019-11-17 ENCOUNTER — Ambulatory Visit (INDEPENDENT_AMBULATORY_CARE_PROVIDER_SITE_OTHER): Payer: PRIVATE HEALTH INSURANCE | Admitting: Orthopaedic Surgery

## 2019-11-17 DIAGNOSIS — M25562 Pain in left knee: Secondary | ICD-10-CM | POA: Diagnosis not present

## 2019-11-17 DIAGNOSIS — G8929 Other chronic pain: Secondary | ICD-10-CM | POA: Diagnosis not present

## 2019-11-17 MED ORDER — METHYLPREDNISOLONE ACETATE 40 MG/ML IJ SUSP
40.0000 mg | INTRAMUSCULAR | Status: AC | PRN
  Administered 2019-11-17: 40 mg via INTRA_ARTICULAR

## 2019-11-17 MED ORDER — LIDOCAINE HCL 1 % IJ SOLN
3.0000 mL | INTRAMUSCULAR | Status: AC | PRN
  Administered 2019-11-17: 3 mL

## 2019-11-17 NOTE — Progress Notes (Signed)
Office Visit Note   Patient: Caroline Keller           Date of Birth: December 04, 1960           MRN: [id number] Visit Date: 11/17/2019              Requested by: Jarrett Soho, PA-C [address] [place],  [place] [address] PCP: Jarrett Soho, PA-C   Assessment & Plan: Visit Diagnoses: No diagnosis found.  Plan: I did place a steroid injection in her left knee today to temporize the acute pain she is having.  Given her locking catching or mechanical symptoms combined with failure conservative treatment including arthroscopic intervention, steroid injections and hyaluronic acid injection, a MRI of the left knee is warranted to further assess the cartilage in the meniscus.  All questions and concerns were answered addressed.  We will see her back in 2 weeks to go over the MRI of the left knee.  Follow-Up Instructions: Return in about 2 weeks (around 12/01/2019).   Orders:  Orders Placed This Encounter  Procedures   Large Joint Inj   No orders of the defined types were placed in this encounter.     Procedures: Large Joint Inj: L knee on 11/17/2019 10:44 AM Indications: diagnostic evaluation and pain Details: 22 G 1.5 in needle, superolateral approach  Arthrogram: No  Medications: 3 mL lidocaine 1 %; 40 mg methylPREDNISolone acetate 40 MG/ML Outcome: tolerated well, no immediate complications Procedure, treatment alternatives, risks and benefits explained, specific risks discussed. Consent was given by the patient. Immediately prior to procedure a time out was called to verify the correct patient, procedure, equipment, support staff and site/side marked as required. Patient was prepped and draped in the usual sterile fashion.       Clinical Data: No additional findings.   Subjective: Chief Complaint  Patient presents with   Left Knee - Pain  The patient is well-known to me.  She has a history of a left knee arthroscopy due to a severe medial meniscal tear that was  quite complex.  She also had thinning of the articular cartilage on the medial compartment of her knee and the patellofemoral joint.  Her lateral compartment was pristine.  Since surgery she has had a steroid injection in that knee.  The surgery was in July of last year.  In September she ended up having a hyaluronic acid injection to help with the pain in the knee.  She has done well for a long period time but then over the last 6 weeks has developed locking catching with the knee with posterior lateral pain and anterior lateral pain that she did not have before.  She said no other acute change in her medical status.  HPI  Review of Systems She currently denies any headache, chest pain, shortness of breath, fever, chills, nausea, vomiting  Objective: Vital Signs: There were no vitals taken for this visit.  Physical Exam She is alert and oriented x3 and in no acute distress Ortho Exam Examination of her left knee does show a positive McMurray sign to the medial and lateral compartments as well as a mild effusion and global pain and tenderness with range of motion of the knee.  The knee feels ligamentously stable. Specialty Comments:  No specialty comments available.  Imaging: No results found.   PMFS History: Patient Active Problem List   Diagnosis Date Noted   Unilateral primary osteoarthritis, left knee 02/02/2019   S/P arthroscopy of left knee 11/06/2018  Unilateral primary osteoarthritis, right knee 03/19/2018   Hyperlipidemia 02/25/2017   Chronic left-sided low back pain with left-sided sciatica 09/13/2016   Trigger thumb, left thumb 09/13/2016   Trigger thumb, right thumb 09/13/2016   Substernal chest pain 01/03/2015   PVC's (premature ventricular contractions) 03/23/2014   PAC (premature atrial contraction) 03/23/2014   Benign essential HTN 03/23/2014   Hepatitis C 12/08/2013   GERD (gastroesophageal reflux disease)    Past Medical History:  Diagnosis Date     Anxiety    Arthritis of low back    GERD (gastroesophageal reflux disease)    Hepatitis C    History of C-section    History of cholecystectomy    History of hiatal hernia    Hyperlipidemia    Hypertension    PAC (premature atrial contraction)    Palpitations    PVC (premature ventricular contraction)     Family History  Problem Relation Age of Onset   Lung cancer Mother    Coronary artery disease Father    Breast cancer Sister    Colon cancer Neg Hx    Rectal cancer Neg Hx    Stomach cancer Neg Hx    Pancreatic cancer Neg Hx    Esophageal cancer Neg Hx     Past Surgical History:  Procedure Laterality Date   CARDIOVASCULAR STRESS TEST  01/04/2015   CESAREAN SECTION     CHOLECYSTECTOMY     KNEE SURGERY  10/2018   Social History   Occupational History   Not on file  Tobacco Use   Smoking status: Former Smoker    Quit date: 04/16/1981    Years since quitting: 38.6   Smokeless tobacco: Never Used  Vaping Use   Vaping Use: Never used  Substance and Sexual Activity   Alcohol use: No   Drug use: No   Sexual activity: Not on file

## 2019-11-19 ENCOUNTER — Other Ambulatory Visit: Payer: Self-pay

## 2019-11-19 DIAGNOSIS — G8929 Other chronic pain: Secondary | ICD-10-CM

## 2019-12-01 ENCOUNTER — Ambulatory Visit: Payer: PRIVATE HEALTH INSURANCE | Admitting: Orthopaedic Surgery

## 2019-12-07 ENCOUNTER — Ambulatory Visit
Admission: RE | Admit: 2019-12-07 | Discharge: 2019-12-07 | Disposition: A | Discharge status: Home / Self Care | Payer: PRIVATE HEALTH INSURANCE | Source: Ambulatory Visit | Attending: Orthopaedic Surgery | Admitting: Orthopaedic Surgery

## 2019-12-07 DIAGNOSIS — M25562 Pain in left knee: Secondary | ICD-10-CM

## 2019-12-09 ENCOUNTER — Encounter: Payer: Self-pay | Admitting: Orthopaedic Surgery

## 2019-12-09 ENCOUNTER — Telehealth: Payer: Self-pay | Admitting: Radiology

## 2019-12-09 ENCOUNTER — Ambulatory Visit (INDEPENDENT_AMBULATORY_CARE_PROVIDER_SITE_OTHER): Payer: PRIVATE HEALTH INSURANCE | Admitting: Orthopaedic Surgery

## 2019-12-09 DIAGNOSIS — M1712 Unilateral primary osteoarthritis, left knee: Secondary | ICD-10-CM | POA: Diagnosis not present

## 2019-12-09 MED ORDER — METHYLPREDNISOLONE ACETATE 40 MG/ML IJ SUSP
40.0000 mg | INTRAMUSCULAR | Status: AC | PRN
  Administered 2019-12-09: 40 mg via INTRAMUSCULAR

## 2019-12-09 MED ORDER — LIDOCAINE HCL 1 % IJ SOLN
0.5000 mL | INTRAMUSCULAR | Status: AC | PRN
  Administered 2019-12-09: .5 mL

## 2019-12-09 NOTE — Progress Notes (Signed)
Office Visit Note   Patient: Caroline Keller           Date of Birth: 11/01/1960           MRN: [id number] Visit Date: 12/09/2019              Requested by: Jarrett Soho, PA-C [address] [place],  [place] [address] PCP: Jarrett Soho, PA-C   Assessment & Plan: Visit Diagnoses:  1. Unilateral primary osteoarthritis, left knee     Plan:  The patient tolerated his injection left knee today.  We will see her back for supplemental injection left knee in the near future once this proved.  She is had a supplemental injection in the past and did well with this.  Questions were encouraged and answered at length by Dr. Magnus Ivan myself.  Follow-Up Instructions: No follow-ups on file.   Orders:  Orders Placed This Encounter  Procedures  . Trigger Point Inj  . Trigger Point Inj   No orders of the defined types were placed in this encounter.     Procedures: Trigger Point Inj  Date/Time: 12/09/2019 11:23 AM Performed by: Kirtland Bouchard, PA-C Authorized by: Kirtland Bouchard, PA-C   Total # of Trigger Points:  1 Location: lower extremity   Approach:  Medial Medications #1:  40 mg methylPREDNISolone acetate 40 MG/ML; 0.5 mL lidocaine 1 %     Clinical Data: No additional findings.   Subjective: Chief Complaint  Patient presents with  . Left Knee - Follow-up    HPI Caroline Keller returns today to go over the MRI of her left knee.  She states that the injection on 11/17/2019 helped some.  She is having pain mostly medial aspect of the knee now.  She is now only having a lateral pain or pain in the posterior aspect of the knee.  She is having no mechanical symptoms.  She has had no new injury.  MRI images are reviewed with the patient.  Postsurgical changes involving the medial meniscus but no frank acute tear.  Mild medial compartmental and patellofemoral compartmental arthritic changes.  Moderate Baker's cyst present.  Review of Systems Negative for fevers or  chills.  Objective: Vital Signs: There were no vitals taken for this visit.  Physical Exam Constitutional:      Appearance: She is not ill-appearing or diaphoretic.  Neurological:     Mental Status: She is alert and oriented to person, place, and time.  Psychiatric:        Mood and Affect: Mood normal.     Ortho Exam Left knee good range of motion without significant pain.  Nontender over the medial lateral joint line.  Nontender of the popliteal region.  Tenderness over the pes anserinus region.  No instability with valgus varus stressing.  Negative for abnormal warmth erythema or effusion left knee. Specialty Comments:  No specialty comments available.  Imaging: No results found.   PMFS History: Patient Active Problem List   Diagnosis Date Noted  . Unilateral primary osteoarthritis, left knee 02/02/2019  . S/P arthroscopy of left knee 11/06/2018  . Unilateral primary osteoarthritis, right knee 03/19/2018  . Hyperlipidemia 02/25/2017  . Chronic left-sided low back pain with left-sided sciatica 09/13/2016  . Trigger thumb, left thumb 09/13/2016  . Trigger thumb, right thumb 09/13/2016  . Substernal chest pain 01/03/2015  . PVC's (premature ventricular contractions) 03/23/2014  . PAC (premature atrial contraction) 03/23/2014  . Benign essential HTN 03/23/2014  . Hepatitis C 12/08/2013  .  GERD (gastroesophageal reflux disease)    Past Medical History:  Diagnosis Date  . Anxiety   . Arthritis of low back   . GERD (gastroesophageal reflux disease)   . Hepatitis C   . History of C-section   . History of cholecystectomy   . History of hiatal hernia   . Hyperlipidemia   . Hypertension   . PAC (premature atrial contraction)   . Palpitations   . PVC (premature ventricular contraction)     Family History  Problem Relation Age of Onset  . Lung cancer Mother   . Coronary artery disease Father   . Breast cancer Sister   . Colon cancer Neg Hx   . Rectal cancer Neg Hx    . Stomach cancer Neg Hx   . Pancreatic cancer Neg Hx   . Esophageal cancer Neg Hx     Past Surgical History:  Procedure Laterality Date  . CARDIOVASCULAR STRESS TEST  01/04/2015  . CESAREAN SECTION    . CHOLECYSTECTOMY    . KNEE SURGERY  10/2018   Social History   Occupational History  . Not on file  Tobacco Use  . Smoking status: Former Smoker    Quit date: 04/16/1981    Years since quitting: 38.6  . Smokeless tobacco: Never Used  Vaping Use  . Vaping Use: Never used  Substance and Sexual Activity  . Alcohol use: No  . Drug use: No  . Sexual activity: Not on file

## 2019-12-09 NOTE — Telephone Encounter (Signed)
Left knee supplemental injection Left knee OA

## 2019-12-09 NOTE — Telephone Encounter (Signed)
Noted  

## 2019-12-11 ENCOUNTER — Telehealth: Payer: Self-pay

## 2019-12-11 NOTE — Telephone Encounter (Signed)
Submitted VOB, SynviscOne, left knee. 

## 2019-12-12 ENCOUNTER — Other Ambulatory Visit: Payer: PRIVATE HEALTH INSURANCE

## 2019-12-13 ENCOUNTER — Other Ambulatory Visit: Payer: PRIVATE HEALTH INSURANCE

## 2019-12-25 ENCOUNTER — Telehealth: Payer: Self-pay

## 2019-12-25 NOTE — Telephone Encounter (Signed)
Approved, SynviscOne, left knee. Buy & Bill Patient will be responsible for 20% OOP. No Co-pay No PA is required, per [facility] with [facility] Care [facility], reference# [id number]  Appt. 01/04/2020

## 2020-01-04 ENCOUNTER — Encounter: Payer: Self-pay | Admitting: Orthopaedic Surgery

## 2020-01-04 ENCOUNTER — Ambulatory Visit (INDEPENDENT_AMBULATORY_CARE_PROVIDER_SITE_OTHER): Payer: PRIVATE HEALTH INSURANCE | Admitting: Orthopaedic Surgery

## 2020-01-04 DIAGNOSIS — M1712 Unilateral primary osteoarthritis, left knee: Secondary | ICD-10-CM

## 2020-01-04 DIAGNOSIS — G8929 Other chronic pain: Secondary | ICD-10-CM

## 2020-01-04 DIAGNOSIS — M25562 Pain in left knee: Secondary | ICD-10-CM

## 2020-01-04 MED ORDER — HYLAN G-F 20 48 MG/6ML IX SOSY
48.0000 mg | PREFILLED_SYRINGE | INTRA_ARTICULAR | Status: AC | PRN
  Administered 2020-01-04: 48 mg via INTRA_ARTICULAR

## 2020-01-04 NOTE — Progress Notes (Signed)
   Procedure Note  Patient: Caroline Keller             Date of Birth: Oct 07, 1960           MRN: [id number]             Visit Date: 01/04/2020  Procedures: Visit Diagnoses:  1. Unilateral primary osteoarthritis, left knee   2. Chronic pain of left knee     Large Joint Inj: L knee on 01/04/2020 2:06 PM Indications: pain and diagnostic evaluation Details: 22 G 1.5 in needle, superolateral approach  Arthrogram: No  Medications: 48 mg Hylan 48 MG/6ML Outcome: tolerated well, no immediate complications Procedure, treatment alternatives, risks and benefits explained, specific risks discussed. Consent was given by the patient. Immediately prior to procedure a time out was called to verify the correct patient, procedure, equipment, support staff and site/side marked as required. Patient was prepped and draped in the usual sterile fashion.    The patient comes in today for scheduled hyaluronic acid injection with Synvisc 1 in the left knee to treat the pain from osteoarthritis.  She has tried and failed other forms of conservative treatment including steroid injections.  At this point her left knee pain is daily.  There was no effusion of her left knee today.  She tolerated the Synvisc injection well.  We will see her back in about 2 months to see how she is doing overall.  All questions and concerns were answered and addressed.

## 2020-01-13 ENCOUNTER — Telehealth: Payer: Self-pay | Admitting: Orthopaedic Surgery

## 2020-01-13 NOTE — Telephone Encounter (Signed)
Patient called.   She wanted to know if she could get a gel injection in her opposite knee.   Call back: [phone]

## 2020-01-13 NOTE — Telephone Encounter (Signed)
Please advise 

## 2020-01-13 NOTE — Telephone Encounter (Signed)
Noted  

## 2020-01-15 ENCOUNTER — Telehealth: Payer: Self-pay | Admitting: Orthopaedic Surgery

## 2020-01-15 NOTE — Telephone Encounter (Signed)
Pt called asking that whenever we get word back about wether she was approved for the gel injection or not that we give her a call to update her  [phone]

## 2020-01-18 NOTE — Telephone Encounter (Signed)
Called and left a VM advising patient that once gel injection has been approved through her insurance, she will receive a call to schedule.

## 2020-01-22 ENCOUNTER — Telehealth: Payer: Self-pay | Admitting: Orthopaedic Surgery

## 2020-01-22 ENCOUNTER — Telehealth: Payer: Self-pay

## 2020-01-22 NOTE — Telephone Encounter (Signed)
Submitted VOB, SynviscOne, right knee. 

## 2020-01-22 NOTE — Telephone Encounter (Signed)
Pt has been trying to send Dr. Magnus Ivan a message on mychart in regards to an injection and would like a CB   [phone]

## 2020-01-25 ENCOUNTER — Telehealth: Payer: Self-pay

## 2020-01-25 ENCOUNTER — Other Ambulatory Visit: Payer: Self-pay | Admitting: Orthopaedic Surgery

## 2020-01-25 MED ORDER — ACETAMINOPHEN-CODEINE #3 300-30 MG PO TABS: 1.0000 | ORAL_TABLET | Freq: Three times a day (TID) | ORAL | 0 refills | Status: DC | PRN

## 2020-01-25 NOTE — Telephone Encounter (Signed)
Talked with patient about gel injection for right knee, see previous message.    Patient stated that her left knee did not take the gel injection and would like to know what her next option would be?  Please advise.  Thank you.

## 2020-01-25 NOTE — Telephone Encounter (Signed)
Talked with patient concerning gel injection for right knee.  Advised patient that there was an issue with her insurance on the mySynviscOne portal.  Advised her that I've uploaded her insurance to them and once it is reviewed, I should receive a response for the injection.  Patient voiced that she understands.

## 2020-01-26 NOTE — Telephone Encounter (Signed)
At this point. We will need to see her back in the office to consider what other surgical options that she may consider such as even a joint replacement. We should see her sometime in the next 2 weeks.

## 2020-02-03 ENCOUNTER — Ambulatory Visit (INDEPENDENT_AMBULATORY_CARE_PROVIDER_SITE_OTHER): Payer: PRIVATE HEALTH INSURANCE | Admitting: Orthopaedic Surgery

## 2020-02-03 ENCOUNTER — Ambulatory Visit: Payer: Self-pay

## 2020-02-03 ENCOUNTER — Ambulatory Visit (INDEPENDENT_AMBULATORY_CARE_PROVIDER_SITE_OTHER): Payer: PRIVATE HEALTH INSURANCE

## 2020-02-03 ENCOUNTER — Encounter: Payer: Self-pay | Admitting: Orthopaedic Surgery

## 2020-02-03 VITALS — Ht 65.0 in | Wt 205.0 lb

## 2020-02-03 DIAGNOSIS — M25561 Pain in right knee: Secondary | ICD-10-CM

## 2020-02-03 DIAGNOSIS — M1712 Unilateral primary osteoarthritis, left knee: Secondary | ICD-10-CM

## 2020-02-03 DIAGNOSIS — M25562 Pain in left knee: Secondary | ICD-10-CM | POA: Diagnosis not present

## 2020-02-03 DIAGNOSIS — G8929 Other chronic pain: Secondary | ICD-10-CM

## 2020-02-03 MED ORDER — LIDOCAINE HCL 1 % IJ SOLN
3.0000 mL | INTRAMUSCULAR | Status: AC | PRN
  Administered 2020-02-03: 3 mL

## 2020-02-03 MED ORDER — METHYLPREDNISOLONE ACETATE 40 MG/ML IJ SUSP
40.0000 mg | INTRAMUSCULAR | Status: AC | PRN
  Administered 2020-02-03: 40 mg via INTRA_ARTICULAR

## 2020-02-03 NOTE — Progress Notes (Addendum)
Office Visit Note   Patient: Caroline Keller           Date of Birth: 1960-09-28           MRN: [id number] Visit Date: 02/03/2020              Requested by: Jarrett Soho, PA-C [address] [place],  [place] [address] PCP: Jarrett Soho, PA-C   Assessment & Plan: Visit Diagnoses:  1. Unilateral primary osteoarthritis, left knee   2. Chronic pain of right knee     Plan: In order to calm down the severe acute pain in both her knees today, I did provide a steroid injection of both knees.  Given the normal plain film findings of the right knee but with continued right knee pain, a MRI is warranted to assess the cartilage in the right knee.  As far as the left knee goes, I talked in detail about the possibility of knee replacement surgery.  I showed a knee model explained in detail what this involves.  I would like to see her back in 2 weeks to go over the MRI of her right knee and to continue to make determination of whether or not she would consider proceeding with a left total knee arthroplasty.  Follow-Up Instructions: Return in about 2 weeks (around 02/17/2020).   Orders:  Orders Placed This Encounter  Procedures  . XR KNEE 3 VIEW LEFT  . XR Knee 1-2 Views Right   No orders of the defined types were placed in this encounter.     Procedures: Large Joint Inj: R knee on 02/03/2020 2:31 PM Indications: diagnostic evaluation and pain Details: 22 G 1.5 in needle, superolateral approach  Arthrogram: No  Medications: 3 mL lidocaine 1 %; 40 mg methylPREDNISolone acetate 40 MG/ML Outcome: tolerated well, no immediate complications Procedure, treatment alternatives, risks and benefits explained, specific risks discussed. Consent was given by the patient. Immediately prior to procedure a time out was called to verify the correct patient, procedure, equipment, support staff and site/side marked as required. Patient was prepped and draped in the usual sterile fashion.   Large  Joint Inj: L knee on 02/03/2020 2:31 PM Indications: diagnostic evaluation and pain Details: 22 G 1.5 in needle, superolateral approach  Arthrogram: No  Medications: 3 mL lidocaine 1 %; 40 mg methylPREDNISolone acetate 40 MG/ML Outcome: tolerated well, no immediate complications Procedure, treatment alternatives, risks and benefits explained, specific risks discussed. Consent was given by the patient. Immediately prior to procedure a time out was called to verify the correct patient, procedure, equipment, support staff and site/side marked as required. Patient was prepped and draped in the usual sterile fashion.       Clinical Data: No additional findings.   Subjective: Chief Complaint  Patient presents with  . Right Knee - Pain  . Left Knee - Pain  The patient is very well-known to me.  Have seen her for a long period of time and now she is developing worse debilitating pain of both her knees.  Her left knee is the knee that we performed arthroscopic surgery on in July of last year.  We have an MRI that this year and it showed worsening arthritic changes in the medial compartment of the knee and the patellofemoral joint.  It hurts weightbearing and aches all day long.  It is more of a constant pain.  We did provide a hyaluronic acid injection that left knee that did not help.  She has had  hyaluronic acid in the right knee before and that helped greatly.  She is developing significant right knee pain now as well.  She absolutely looks like she is guarding quite a bit as she walks.  She denies any locking catching with either knee.  HPI  Review of Systems Other than high blood pressure, there is no listed headache, chest pain, shortness of breath, fever, chills, nausea, vomiting  Objective: Vital Signs: Ht 5\' 5"  (1.651 m)   Wt 205 lb (93 kg)   BMI 34.11 kg/m   Physical Exam She is alert and oriented x3 Ortho Exam examination of both knees shows no effusion.  She does have  significant medial joint line tenderness with both knees. Specialty Comments:  No specialty comments available.  Imaging: XR Knee 1-2 Views Right  Result Date: 02/03/2020 3 views of the right knee show no acute findings.  The medial lateral compartments are well-maintained.  There is patellofemoral joint disease in terms of arthritic changes.  XR KNEE 3 VIEW LEFT  Result Date: 02/03/2020 3 views of the left knee show moderate tricompartment arthritic changes with slight varus malalignment and significant narrowing at the patellofemoral joint and moderate narrowing at the medial compartment of the knee.    PMFS History: Patient Active Problem List   Diagnosis Date Noted  . Unilateral primary osteoarthritis, left knee 02/02/2019  . S/P arthroscopy of left knee 11/06/2018  . Unilateral primary osteoarthritis, right knee 03/19/2018  . Hyperlipidemia 02/25/2017  . Chronic left-sided low back pain with left-sided sciatica 09/13/2016  . Trigger thumb, left thumb 09/13/2016  . Trigger thumb, right thumb 09/13/2016  . Substernal chest pain 01/03/2015  . PVC's (premature ventricular contractions) 03/23/2014  . PAC (premature atrial contraction) 03/23/2014  . Benign essential HTN 03/23/2014  . Hepatitis C 12/08/2013  . GERD (gastroesophageal reflux disease)    Past Medical History:  Diagnosis Date  . Anxiety   . Arthritis of low back   . GERD (gastroesophageal reflux disease)   . Hepatitis C   . History of C-section   . History of cholecystectomy   . History of hiatal hernia   . Hyperlipidemia   . Hypertension   . PAC (premature atrial contraction)   . Palpitations   . PVC (premature ventricular contraction)     Family History  Problem Relation Age of Onset  . Lung cancer Mother   . Coronary artery disease Father   . Breast cancer Sister   . Colon cancer Neg Hx   . Rectal cancer Neg Hx   . Stomach cancer Neg Hx   . Pancreatic cancer Neg Hx   . Esophageal cancer Neg Hx       Past Surgical History:  Procedure Laterality Date  . CARDIOVASCULAR STRESS TEST  01/04/2015  . CESAREAN SECTION    . CHOLECYSTECTOMY    . KNEE SURGERY  10/2018   Social History   Occupational History  . Not on file  Tobacco Use  . Smoking status: Former Smoker    Quit date: 04/16/1981    Years since quitting: 38.8  . Smokeless tobacco: Never Used  Vaping Use  . Vaping Use: Never used  Substance and Sexual Activity  . Alcohol use: No  . Drug use: No  . Sexual activity: Not on file

## 2020-02-03 NOTE — Addendum Note (Signed)
Addended by: Rogers Seeds on: 02/03/2020 02:40 PM   Modules accepted: Orders

## 2020-02-17 ENCOUNTER — Ambulatory Visit: Payer: PRIVATE HEALTH INSURANCE | Admitting: Orthopaedic Surgery

## 2020-02-21 ENCOUNTER — Other Ambulatory Visit: Payer: Self-pay

## 2020-02-21 ENCOUNTER — Ambulatory Visit
Admission: RE | Admit: 2020-02-21 | Discharge: 2020-02-21 | Disposition: A | Discharge status: Home / Self Care | Payer: PRIVATE HEALTH INSURANCE | Source: Ambulatory Visit | Attending: Orthopaedic Surgery | Admitting: Orthopaedic Surgery

## 2020-02-21 DIAGNOSIS — G8929 Other chronic pain: Secondary | ICD-10-CM

## 2020-02-23 ENCOUNTER — Encounter: Payer: Self-pay | Admitting: Orthopaedic Surgery

## 2020-02-23 ENCOUNTER — Ambulatory Visit (INDEPENDENT_AMBULATORY_CARE_PROVIDER_SITE_OTHER): Payer: PRIVATE HEALTH INSURANCE | Admitting: Orthopaedic Surgery

## 2020-02-23 VITALS — Ht 65.0 in | Wt 209.4 lb

## 2020-02-23 DIAGNOSIS — M1711 Unilateral primary osteoarthritis, right knee: Secondary | ICD-10-CM | POA: Diagnosis not present

## 2020-02-23 DIAGNOSIS — M1712 Unilateral primary osteoarthritis, left knee: Secondary | ICD-10-CM

## 2020-02-23 NOTE — Progress Notes (Signed)
HPI: Ms. Tellefsen returns today to go over the MRI of her right knee.  She also is here to see what type of relief she had with the bilateral cortisone injections on 02/03/2020.  She states that both knees are bothering her left worse than right.  She really got no relief from the cortisone injection of either knee. MRI images are reviewed with the patient along with a knee model urged shown this patient.  MRI right knee dated 02/21/2020 showed a radial tear of the posterior horn medial meniscus.  There was peripheral meniscal extrusion.  Degeneration of the body of the medial meniscus.  Patellofemoral joint with full-thickness cartilage loss at the apex and medial patellar facet..  Partial-thickness cartilage loss involving the medial trochlea.  Medial femoral condyle with partial thickness cartilage loss with areas of full-thickness cartilage loss over the posterior weightbearing surface with subchondral edema noted.   Review of systems: Denies any fevers chills shortness of breath chest pain.  Physical exam: General well-developed well-nourished female no acute distress mood affect appropriate.  Impression: Bilateral knee osteoarthritis  Plan: Due to the fact the patient has gotten some relief with supplemental injections in the right knee in the past recommend repeat supplemental injection would not recommend knee arthroscopy.  In regards to left knee she wants to proceed with left total knee arthroplasty.  Questions were encouraged and answered at length by Dr. Magnus Ivan and myself.  Risk benefits of surgery discussed with patient along with the postoperative protocol.  Risk include but are not limited to DVT/PE, wound healing problems, prolonged pain, nerve vessel injury and infection.  Full was work on setting her up for a left total knee arthroplasty near future.  We will have her back 2 weeks postop.  Also work on a supplemental injection for the right knee and have her back once this is available.

## 2020-02-24 ENCOUNTER — Telehealth: Payer: Self-pay

## 2020-02-24 NOTE — Telephone Encounter (Signed)
Previously submitted. PA required for SynviscOne, right knee.

## 2020-02-24 NOTE — Telephone Encounter (Signed)
Right knee gel injection  

## 2020-02-29 ENCOUNTER — Other Ambulatory Visit: Payer: Self-pay | Admitting: Orthopaedic Surgery

## 2020-03-04 ENCOUNTER — Telehealth: Payer: Self-pay

## 2020-03-04 NOTE — Telephone Encounter (Signed)
Talked with Caroline Keller at [facility] and was advised that no PA is required for SynviscOne [phone]) due to injection being done in th office. Reference#[id number]  Approved for SynviscOne, right knee. Buy & Bill Patient is covered at 100% through her insurance. No Co-pay No PA required  Appt. 03/07/2020 with Caroline Keller

## 2020-03-07 ENCOUNTER — Ambulatory Visit: Payer: PRIVATE HEALTH INSURANCE | Admitting: Physician Assistant

## 2020-03-07 ENCOUNTER — Ambulatory Visit: Payer: PRIVATE HEALTH INSURANCE | Admitting: Orthopaedic Surgery

## 2020-03-21 ENCOUNTER — Other Ambulatory Visit: Payer: Self-pay

## 2020-03-21 ENCOUNTER — Encounter: Payer: Self-pay | Admitting: Orthopaedic Surgery

## 2020-03-24 ENCOUNTER — Ambulatory Visit (INDEPENDENT_AMBULATORY_CARE_PROVIDER_SITE_OTHER): Payer: PRIVATE HEALTH INSURANCE | Admitting: Physician Assistant

## 2020-03-24 ENCOUNTER — Encounter: Payer: Self-pay | Admitting: Physician Assistant

## 2020-03-24 DIAGNOSIS — M1711 Unilateral primary osteoarthritis, right knee: Secondary | ICD-10-CM | POA: Diagnosis not present

## 2020-03-24 MED ORDER — LIDOCAINE HCL 1 % IJ SOLN
0.5000 mL | INTRAMUSCULAR | Status: AC | PRN
  Administered 2020-03-24: .5 mL

## 2020-03-24 MED ORDER — HYLAN G-F 20 48 MG/6ML IX SOSY
48.0000 mg | PREFILLED_SYRINGE | INTRA_ARTICULAR | Status: AC | PRN
  Administered 2020-03-24: 48 mg via INTRA_ARTICULAR

## 2020-03-24 NOTE — Progress Notes (Signed)
   Procedure Note  Patient: Caroline Keller             Date of Birth: 09/21/1960           MRN: [id number]             Visit Date: 03/24/2020 HPI Caroline Keller is well-known to Dr. Eliberto Ivory service comes in today for Synvisc 1 injection right knee.  She is scheduled to have her left total knee arthroplasty December 28.  She has had no new injury to the right knee.  Physical exam: Right knee no abnormal warmth erythema or effusion. Procedures: Visit Diagnoses:  1. Primary osteoarthritis of right knee     Large Joint Inj: R knee on 03/24/2020 3:10 PM Indications: pain Details: 22 G 1.5 in needle, anterolateral approach  Arthrogram: No  Medications: 0.5 mL lidocaine 1 %; 48 mg Hylan 48 MG/6ML Outcome: tolerated well, no immediate complications Procedure, treatment alternatives, risks and benefits explained, specific risks discussed. Consent was given by the patient. Immediately prior to procedure a time out was called to verify the correct patient, procedure, equipment, support staff and site/side marked as required. Patient was prepped and draped in the usual sterile fashion.     Plan: We will see her back after her total knee arthroplasty to see how she is done with the right knee Synvisc 1 injection.  Questions were encouraged and answered.

## 2020-03-28 ENCOUNTER — Other Ambulatory Visit: Payer: Self-pay | Admitting: Orthopaedic Surgery

## 2020-03-29 ENCOUNTER — Encounter: Payer: Self-pay | Admitting: Orthopaedic Surgery

## 2020-03-29 ENCOUNTER — Other Ambulatory Visit: Payer: Self-pay | Admitting: Physician Assistant

## 2020-04-05 ENCOUNTER — Inpatient Hospital Stay (HOSPITAL_COMMUNITY): Admission: RE | Admit: 2020-04-05 | Payer: PRIVATE HEALTH INSURANCE | Source: Ambulatory Visit

## 2020-04-05 NOTE — Progress Notes (Signed)
[id number] [place], [place] - [address] [place] [address] Phone: [phone] Fax: [phone]  [facility]) E[facility] - [place], [place] - [address] [place] [address] Phone: [phone] Fax: [phone]      Your procedure is scheduled on Tuesday December 28th .  Report to [facility] Main Entrance "A" at 5:30 A.M., and check in at the Admitting office.  Call this number if you have problems the morning of surgery:  [phone]  Call [phone] if you have any questions prior to your surgery date Monday-Friday 8am-4pm    Remember:  Do not eat after midnight the night before your surgery  You may drink clear liquids until 4:30 the morning of your surgery.   Clear liquids allowed are: Water, Non-Citrus Juices (without pulp), Carbonated Beverages, Clear Tea, Black Coffee Only, and Gatorade   Enhanced Recovery after Surgery for Orthopedics Enhanced Recovery after Surgery is a protocol used to improve the stress on your body and your recovery after surgery.  Patient Instructions  . The night before surgery:  o No food after midnight. ONLY clear liquids after midnight  .  Marland Kitchen The day of surgery (if you do NOT have diabetes):  o Drink ONE (1) Pre-Surgery Clear Ensure by _4:30____ am the morning of surgery   o This drink was given to you during your hospital  pre-op appointment visit. o Nothing else to drink after completing the  Pre-Surgery Clear Ensure.         If you have questions, please contact your surgeon's office.    Take these medicines the morning of surgery with A SIP OF WATER   atorvastatin (LIPITOR) 10 MG tablet  diltiazem (CARDIZEM CD) 360 MG 24 hr capsule  escitalopram (LEXAPRO) 20 MG tablet  pantoprazole (PROTONIX) 20 MG tablet   IF NEEDED  Acetaminophen-Codeine 300-30 MG tablet  As of today, STOP taking any Aspirin (unless otherwise instructed by your  surgeon) Aleve, Naproxen, Ibuprofen, Motrin, Advil, Goody's, BC's, all herbal medications, fish oil, and all vitamins.                      Do not wear jewelry, make up, or nail polish            Do not wear lotions, powders, perfumes, or deodorant.            Do not shave 48 hours prior to surgery.              Do not bring valuables to the hospital.            [facility] is not responsible for any belongings or valuables.  Do NOT Smoke (Tobacco/Vaping) or drink Alcohol 24 hours prior to your procedure If you use a CPAP at night, you may bring all equipment for your overnight stay.   Contacts, glasses, dentures or bridgework may not be worn into surgery.      For patients admitted to the hospital, discharge time will be determined by your treatment team.   Patients discharged the day of surgery will not be allowed to drive home, and someone needs to stay with them for 24 hours.    Special instructions:   [facility]- Preparing For Surgery  Before surgery, you can play an important role. Because skin is not sterile, your skin needs to be as free of germs as possible. You can reduce the number of germs on your skin  by washing with CHG (chlorahexidine gluconate) Soap before surgery.  CHG is an antiseptic cleaner which kills germs and bonds with the skin to continue killing germs even after washing.    Oral Hygiene is also important to reduce your risk of infection.  Remember - BRUSH YOUR TEETH THE MORNING OF SURGERY WITH YOUR REGULAR TOOTHPASTE  Please do not use if you have an allergy to CHG or antibacterial soaps. If your skin becomes reddened/irritated stop using the CHG.  Do not shave (including legs and underarms) for at least 48 hours prior to first CHG shower. It is OK to shave your face.  Please follow these instructions carefully.   1. Shower the NIGHT BEFORE SURGERY and the MORNING OF SURGERY with CHG Soap.   2. If you chose to wash your hair, wash your hair first as usual  with your normal shampoo.  3. After you shampoo, rinse your hair and body thoroughly to remove the shampoo.  4. Use CHG as you would any other liquid soap. You can apply CHG directly to the skin and wash gently with a scrungie or a clean washcloth.   5. Apply the CHG Soap to your body ONLY FROM THE NECK DOWN.  Do not use on open wounds or open sores. Avoid contact with your eyes, ears, mouth and genitals (private parts). Wash Face and genitals (private parts)  with your normal soap.   6. Wash thoroughly, paying special attention to the area where your surgery will be performed.  7. Thoroughly rinse your body with warm water from the neck down.  8. DO NOT shower/wash with your normal soap after using and rinsing off the CHG Soap.  9. Pat yourself dry with a CLEAN TOWEL.  10. Wear CLEAN PAJAMAS to bed the night before surgery  11. Place CLEAN SHEETS on your bed the night of your first shower and DO NOT SLEEP WITH PETS.   Day of Surgery: Wear Clean/Comfortable clothing the morning of surgery Do not apply any deodorants/lotions.   Remember to brush your teeth WITH YOUR REGULAR TOOTHPASTE.   Please read over the following fact sheets that you were given.

## 2020-04-06 ENCOUNTER — Other Ambulatory Visit: Payer: Self-pay

## 2020-04-06 ENCOUNTER — Encounter (HOSPITAL_COMMUNITY): Payer: Self-pay

## 2020-04-06 ENCOUNTER — Encounter (HOSPITAL_COMMUNITY)
Admission: RE | Admit: 2020-04-06 | Discharge: 2020-04-06 | Disposition: A | Discharge status: Home / Self Care | Payer: PRIVATE HEALTH INSURANCE | Source: Ambulatory Visit | Attending: Orthopaedic Surgery | Admitting: Orthopaedic Surgery

## 2020-04-06 DIAGNOSIS — F419 Anxiety disorder, unspecified: Secondary | ICD-10-CM | POA: Diagnosis not present

## 2020-04-06 DIAGNOSIS — M1712 Unilateral primary osteoarthritis, left knee: Secondary | ICD-10-CM | POA: Insufficient documentation

## 2020-04-06 DIAGNOSIS — I10 Essential (primary) hypertension: Secondary | ICD-10-CM | POA: Insufficient documentation

## 2020-04-06 DIAGNOSIS — E669 Obesity, unspecified: Secondary | ICD-10-CM | POA: Insufficient documentation

## 2020-04-06 DIAGNOSIS — Z87891 Personal history of nicotine dependence: Secondary | ICD-10-CM | POA: Diagnosis not present

## 2020-04-06 DIAGNOSIS — Z6834 Body mass index (BMI) 34.0-34.9, adult: Secondary | ICD-10-CM | POA: Insufficient documentation

## 2020-04-06 DIAGNOSIS — Z79899 Other long term (current) drug therapy: Secondary | ICD-10-CM | POA: Insufficient documentation

## 2020-04-06 DIAGNOSIS — K219 Gastro-esophageal reflux disease without esophagitis: Secondary | ICD-10-CM | POA: Insufficient documentation

## 2020-04-06 DIAGNOSIS — Z8616 Personal history of COVID-19: Secondary | ICD-10-CM | POA: Insufficient documentation

## 2020-04-06 DIAGNOSIS — Z7901 Long term (current) use of anticoagulants: Secondary | ICD-10-CM | POA: Insufficient documentation

## 2020-04-06 DIAGNOSIS — Z01812 Encounter for preprocedural laboratory examination: Secondary | ICD-10-CM | POA: Diagnosis not present

## 2020-04-06 HISTORY — DX: Other specified postprocedural states: R11.2

## 2020-04-06 HISTORY — DX: Other specified postprocedural states: Z98.890

## 2020-04-06 LAB — BASIC METABOLIC PANEL
Anion gap: 12 (ref 5–15)
BUN: 25 mg/dL — ABNORMAL HIGH (ref 8–23)
CO2: 23 mmol/L (ref 22–32)
Calcium: 9.6 mg/dL (ref 8.9–10.3)
Chloride: 100 mmol/L (ref 98–111)
Creatinine, Ser: 1.02 mg/dL — ABNORMAL HIGH (ref 0.44–1.00)
GFR, Estimated: >60 mL/min (ref >60)
Glucose, Bld: 95 mg/dL (ref 70–99)
Potassium: 4.3 mmol/L (ref 3.5–5.1)
Sodium: 135 mmol/L (ref 135–145)

## 2020-04-06 LAB — CBC
HCT: 39 % (ref 36.0–46.0)
Hemoglobin: 12.9 g/dL (ref 12.0–15.0)
MCH: 32.9 pg (ref 26.0–34.0)
MCHC: 33.1 g/dL (ref 30.0–36.0)
MCV: 99.5 fL (ref 80.0–100.0)
Platelets: 235 10*3/uL (ref 150–400)
RBC: 3.92 MIL/uL (ref 3.87–5.11)
RDW: 12.5 % (ref 11.5–15.5)
WBC: 5.9 10*3/uL (ref 4.0–10.5)
nRBC: 0 % (ref 0.0–0.2)

## 2020-04-06 LAB — TYPE AND SCREEN
ABO/RH(D): A NEG
Antibody Screen: NEGATIVE

## 2020-04-06 LAB — SURGICAL PCR SCREEN
MRSA, PCR: NEGATIVE
Staphylococcus aureus: NEGATIVE

## 2020-04-06 NOTE — Progress Notes (Signed)
Anesthesia Chart Review:  Case: [id number] Date/Time: 04/12/20 0715   Procedure: LEFT TOTAL KNEE ARTHROPLASTY (Left Knee) - RNFA   Anesthesia type: Spinal   Pre-op diagnosis: osteoarthritis left knee   Location: [facility] ROOM 04 / [facility]   Surgeons: Kathryne Hitch, MD      DISCUSSION: Patient is a 59 year old female scheduled for the above procedure.  History includes former smoker (quit 04/16/81), post-operative N/V, Hepatitis C (s/p Harvoni ~ 2018), HTN, GERD, hiatal hernia, palpitations (PVCs, PACs), HLD, anxiety, cholecystectomy (2004), COVID-19 (03/09/19, s/p bamlanivimab infusion 03/13/19).  BMI is consistent with obesity.  She had HTN, HLD, PVC/PAC follow-up with Dr. Mayford Knife on 06/17/19. EKG showed NSR. No palpitations on CCB. BP controlled. Continue atorvastatin.   Preoperative COVID-19 testing is scheduled for 04/11/2020.  Anesthesia team to evaluate on the day of surgery.   VS: BP 132/79   Pulse 84   Temp 36.8 C (Oral)   Resp 17   Ht 5\' 5"  (1.651 m)   Wt 94.6 kg   SpO2 97%   BMI 34.70 kg/m    PROVIDERS: , PA-C is PCP  Jarrett Soho, MD is cardiologist. Last visit 06/17/19. One year follow-up planned. 08/17/19, MD is GI.    LABS: Labs reviewed: Acceptable for surgery. (all labs ordered are listed, but only abnormal results are displayed)  Labs Reviewed  BASIC METABOLIC PANEL - Abnormal; Notable for the following components:      Result Value   BUN 25 (*)    Creatinine, Ser 1.02 (*)    All other components within normal limits  SURGICAL PCR SCREEN  CBC  TYPE AND SCREEN    IMAGES: MRI Right knee 02/21/20: IMPRESSION: 1. Radial tear of the posterior horn of the medial meniscus with peripheral meniscal extrusion. Degeneration of the body of the medial meniscus with fraying along the free edge. 2. Full-thickness cartilage loss of the patellar apex and medial patellar facet. Partial-thickness cartilage loss of the medial trochlea. 3.  Partial-thickness cartilage loss of the medial femorotibial compartment with areas of full-thickness cartilage loss of the posterior weight-bearing surface with subchondral marrow edema.  MRI Left knee 12/07/19: IMPRESSION: 1. Diminutive size of the medial meniscal posterior horn with associated irregularity, at least partially due to the partial medial meniscectomy. Grade 3 signal in the midbody extending to the inferior surface, this may be postoperative or due to the partial meniscectomy. 2. Osteoarthritis with prominent thinning of articular cartilage in the medial compartment and along the medial patellar facet and posterior patellar ridge. Mild chondral thinning elsewhere, with tricompartmental spurring. 3. Small knee joint effusion with moderate size Baker's cyst.   EKG: 06/18/19: NSR   CV: Nuclear stress test 01/04/15: IMPRESSION: 1. No reversible ischemia or infarction. 2. Normal left ventricular wall motion. 3. Left ventricular ejection fraction 59% 4. Low-risk stress test findings.  Cardiac event monitor 01/06/13-01/23/13: Normal sinus rhythm.  Sinus tachycardia.  PACs.  Nonsustained atrial tachycardia up to 5 beats.  Past Medical History:  Diagnosis Date  . Anxiety   . Arthritis of low back   . GERD (gastroesophageal reflux disease)   . Hepatitis C   . History of C-section   . History of cholecystectomy   . History of hiatal hernia   . Hyperlipidemia   . Hypertension   . PAC (premature atrial contraction)   . Palpitations   . PONV (postoperative nausea and vomiting)   . PVC (premature ventricular contraction)     Past Surgical  History:  Procedure Laterality Date  . CARDIOVASCULAR STRESS TEST  01/04/2015  . CESAREAN SECTION    . CHOLECYSTECTOMY    . KNEE SURGERY  10/2018    MEDICATIONS: . Acetaminophen-Codeine 300-30 MG tablet  . atorvastatin (LIPITOR) 10 MG tablet  . calcium carbonate (OSCAL) 1500 (600 Ca) MG TABS tablet  . clonazePAM (KLONOPIN) 1  MG tablet  . diltiazem (CARDIZEM CD) 360 MG 24 hr capsule  . escitalopram (LEXAPRO) 20 MG tablet  . ibuprofen (ADVIL) 200 MG tablet  . pantoprazole (PROTONIX) 20 MG tablet  . telmisartan-hydrochlorothiazide (MICARDIS HCT) 80-25 MG tablet   No current facility-administered medications for this encounter.    Shonna Chock, PA-C Surgical Short Stay/Anesthesiology [facility] Phone [phone] [facility] Phone [phone] 04/06/2020 5:33 PM

## 2020-04-06 NOTE — Anesthesia Preprocedure Evaluation (Addendum)
Anesthesia Evaluation  Patient identified by MRN, date of birth, ID band Patient awake    Reviewed: Allergy & Precautions, NPO status , Patient's Chart, lab work & pertinent test results  History of Anesthesia Complications (+) PONV and history of anesthetic complications  Airway Mallampati: II  TM Distance: >3 FB Neck ROM: Full    Dental   Pulmonary former smoker,    Pulmonary exam normal        Cardiovascular hypertension, Pt. on medications Normal cardiovascular exam     Neuro/Psych PSYCHIATRIC DISORDERS Anxiety  Neuromuscular disease (Left sided sciatica)    GI/Hepatic hiatal hernia, GERD  Medicated and Controlled,(+) Hepatitis -, C  Endo/Other   Obesity   Renal/GU negative Renal ROS     Musculoskeletal  (+) Arthritis ,   Abdominal   Peds  Hematology negative hematology ROS (+)   Anesthesia Other Findings Covid test negative Covid+ 02/2019   Reproductive/Obstetrics                          Anesthesia Physical Anesthesia Plan  ASA: II  Anesthesia Plan: Spinal   Post-op Pain Management:  Regional for Post-op pain   Induction:   PONV Risk Score and Plan: 3 and Treatment may vary due to age or medical condition, Propofol infusion and Ondansetron  Airway Management Planned: Natural Airway and Simple Face Mask  Additional Equipment: None  Intra-op Plan:   Post-operative Plan:   Informed Consent: I have reviewed the patients History and Physical, chart, labs and discussed the procedure including the risks, benefits and alternatives for the proposed anesthesia with the patient or authorized representative who has indicated his/her understanding and acceptance.       Plan Discussed with: CRNA and Anesthesiologist  Anesthesia Plan Comments: (Labs reviewed, platelets acceptable. Discussed risks and benefits of spinal, including spinal/epidural hematoma, infection, failed block,  and PDPH. Patient expressed understanding and wished to proceed. )      Anesthesia Quick Evaluation

## 2020-04-11 ENCOUNTER — Other Ambulatory Visit (HOSPITAL_COMMUNITY)
Admission: RE | Admit: 2020-04-11 | Discharge: 2020-04-11 | Disposition: A | Discharge status: Home / Self Care | Payer: PRIVATE HEALTH INSURANCE | Source: Ambulatory Visit | Attending: Orthopaedic Surgery | Admitting: Orthopaedic Surgery

## 2020-04-11 DIAGNOSIS — Z20822 Contact with and (suspected) exposure to covid-19: Secondary | ICD-10-CM | POA: Diagnosis not present

## 2020-04-11 DIAGNOSIS — Z01812 Encounter for preprocedural laboratory examination: Secondary | ICD-10-CM | POA: Diagnosis not present

## 2020-04-11 LAB — SARS CORONAVIRUS 2 (TAT 6-24 HRS): SARS Coronavirus 2: NEGATIVE

## 2020-04-12 ENCOUNTER — Other Ambulatory Visit: Payer: Self-pay

## 2020-04-12 ENCOUNTER — Ambulatory Visit (HOSPITAL_COMMUNITY): Payer: PRIVATE HEALTH INSURANCE | Admitting: Anesthesiology

## 2020-04-12 ENCOUNTER — Encounter (HOSPITAL_COMMUNITY)
Admission: RE | Disposition: A | Discharge status: Home / Self Care | Payer: Self-pay | Source: Home / Self Care | Attending: Orthopaedic Surgery

## 2020-04-12 ENCOUNTER — Encounter (HOSPITAL_COMMUNITY): Payer: Self-pay | Admitting: Orthopaedic Surgery

## 2020-04-12 ENCOUNTER — Observation Stay (HOSPITAL_COMMUNITY): Payer: PRIVATE HEALTH INSURANCE

## 2020-04-12 ENCOUNTER — Ambulatory Visit (HOSPITAL_COMMUNITY): Payer: PRIVATE HEALTH INSURANCE | Admitting: Vascular Surgery

## 2020-04-12 ENCOUNTER — Observation Stay (HOSPITAL_COMMUNITY)
Admission: RE | Admit: 2020-04-12 | Discharge: 2020-04-13 | Disposition: A | Discharge status: Home / Self Care | Payer: PRIVATE HEALTH INSURANCE | Attending: Orthopaedic Surgery | Admitting: Orthopaedic Surgery

## 2020-04-12 DIAGNOSIS — I10 Essential (primary) hypertension: Secondary | ICD-10-CM | POA: Insufficient documentation

## 2020-04-12 DIAGNOSIS — M1712 Unilateral primary osteoarthritis, left knee: Secondary | ICD-10-CM | POA: Diagnosis present

## 2020-04-12 DIAGNOSIS — Z96652 Presence of left artificial knee joint: Secondary | ICD-10-CM

## 2020-04-12 DIAGNOSIS — Z87891 Personal history of nicotine dependence: Secondary | ICD-10-CM | POA: Diagnosis not present

## 2020-04-12 HISTORY — PX: TOTAL KNEE ARTHROPLASTY: SHX125

## 2020-04-12 LAB — ABO/RH: ABO/RH(D): A NEG

## 2020-04-12 SURGERY — ARTHROPLASTY, KNEE, TOTAL
Anesthesia: Spinal | Site: Knee | Laterality: Left

## 2020-04-12 MED ORDER — MENTHOL 3 MG MT LOZG: 1.0000 | LOZENGE | OROMUCOSAL | Status: DC | PRN

## 2020-04-12 MED ORDER — ONDANSETRON HCL 4 MG/2ML IJ SOLN
INTRAMUSCULAR | Status: AC
  Filled 2020-04-12: qty 2

## 2020-04-12 MED ORDER — LACTATED RINGERS IV SOLN: INTRAVENOUS | Status: DC

## 2020-04-12 MED ORDER — OXYCODONE HCL 5 MG PO TABS: 10.0000 mg | ORAL_TABLET | ORAL | Status: DC | PRN

## 2020-04-12 MED ORDER — CEFAZOLIN SODIUM-DEXTROSE 2-4 GM/100ML-% IV SOLN
2.0000 g | INTRAVENOUS | Status: AC
  Administered 2020-04-12: 08:00:00 2 g via INTRAVENOUS
  Filled 2020-04-12: qty 100

## 2020-04-12 MED ORDER — GABAPENTIN 100 MG PO CAPS
100.0000 mg | ORAL_CAPSULE | Freq: Three times a day (TID) | ORAL | Status: DC
  Administered 2020-04-12 (×3): 100 mg via ORAL
  Filled 2020-04-12 (×3): qty 1

## 2020-04-12 MED ORDER — METOCLOPRAMIDE HCL 5 MG PO TABS
5.0000 mg | ORAL_TABLET | Freq: Three times a day (TID) | ORAL | Status: DC | PRN
  Administered 2020-04-12: 23:00:00 10 mg via ORAL
  Filled 2020-04-12: qty 2

## 2020-04-12 MED ORDER — MIDAZOLAM HCL 2 MG/2ML IJ SOLN
INTRAMUSCULAR | Status: AC
  Filled 2020-04-12: qty 2

## 2020-04-12 MED ORDER — TRANEXAMIC ACID-NACL 1000-0.7 MG/100ML-% IV SOLN
1000.0000 mg | INTRAVENOUS | Status: AC
  Administered 2020-04-12: 08:00:00 1000 mg via INTRAVENOUS
  Filled 2020-04-12: qty 100

## 2020-04-12 MED ORDER — PROMETHAZINE HCL 25 MG/ML IJ SOLN
6.2500 mg | INTRAMUSCULAR | Status: DC | PRN
Start: 2020-04-12 — End: 2020-04-12

## 2020-04-12 MED ORDER — DILTIAZEM HCL ER COATED BEADS 120 MG PO CP24: 360.0000 mg | ORAL_CAPSULE | Freq: Every day | ORAL | Status: DC

## 2020-04-12 MED ORDER — ONDANSETRON HCL 4 MG PO TABS
4.0000 mg | ORAL_TABLET | Freq: Four times a day (QID) | ORAL | Status: DC | PRN
  Administered 2020-04-12 – 2020-04-13 (×2): 4 mg via ORAL
  Filled 2020-04-12 (×2): qty 1

## 2020-04-12 MED ORDER — BUPIVACAINE-EPINEPHRINE 0.5% -1:200000 IJ SOLN
INTRAMUSCULAR | Status: AC
  Filled 2020-04-12: qty 1

## 2020-04-12 MED ORDER — ONDANSETRON HCL 4 MG/2ML IJ SOLN: 4.0000 mg | Freq: Four times a day (QID) | INTRAMUSCULAR | Status: DC | PRN

## 2020-04-12 MED ORDER — PHENYLEPHRINE HCL-NACL 10-0.9 MG/250ML-% IV SOLN
INTRAVENOUS | Status: DC | PRN
  Administered 2020-04-12: 20 ug/min via INTRAVENOUS

## 2020-04-12 MED ORDER — ESCITALOPRAM OXALATE 20 MG PO TABS
20.0000 mg | ORAL_TABLET | Freq: Every day | ORAL | Status: DC
  Filled 2020-04-12: qty 1

## 2020-04-12 MED ORDER — FENTANYL CITRATE (PF) 250 MCG/5ML IJ SOLN
INTRAMUSCULAR | Status: AC
  Filled 2020-04-12: qty 5

## 2020-04-12 MED ORDER — METHOCARBAMOL 500 MG PO TABS
500.0000 mg | ORAL_TABLET | Freq: Four times a day (QID) | ORAL | Status: DC | PRN
  Administered 2020-04-12 – 2020-04-13 (×2): 500 mg via ORAL
  Filled 2020-04-12: qty 1

## 2020-04-12 MED ORDER — ALUM & MAG HYDROXIDE-SIMETH 200-200-20 MG/5ML PO SUSP: 30.0000 mL | ORAL | Status: DC | PRN

## 2020-04-12 MED ORDER — IRBESARTAN 150 MG PO TABS
300.0000 mg | ORAL_TABLET | Freq: Every day | ORAL | Status: DC
  Administered 2020-04-12: 14:00:00 300 mg via ORAL
  Filled 2020-04-12: qty 2

## 2020-04-12 MED ORDER — ASPIRIN EC 325 MG PO TBEC
325.0000 mg | DELAYED_RELEASE_TABLET | Freq: Two times a day (BID) | ORAL | Status: DC
  Administered 2020-04-12 (×2): 325 mg via ORAL
  Filled 2020-04-12 (×4): qty 1

## 2020-04-12 MED ORDER — ATORVASTATIN CALCIUM 10 MG PO TABS: 10.0000 mg | ORAL_TABLET | Freq: Every day | ORAL | Status: DC

## 2020-04-12 MED ORDER — ROPIVACAINE HCL 7.5 MG/ML IJ SOLN
INTRAMUSCULAR | Status: DC | PRN
  Administered 2020-04-12: 20 mL via PERINEURAL

## 2020-04-12 MED ORDER — 0.9 % SODIUM CHLORIDE (POUR BTL) OPTIME
TOPICAL | Status: DC | PRN
  Administered 2020-04-12: 08:00:00 1000 mL

## 2020-04-12 MED ORDER — SODIUM CHLORIDE 0.9 % IR SOLN
Status: DC | PRN
  Administered 2020-04-12: 3000 mL

## 2020-04-12 MED ORDER — METOCLOPRAMIDE HCL 5 MG/ML IJ SOLN
5.0000 mg | Freq: Three times a day (TID) | INTRAMUSCULAR | Status: DC | PRN
  Administered 2020-04-12: 13:00:00 10 mg via INTRAVENOUS
  Filled 2020-04-12: qty 2

## 2020-04-12 MED ORDER — FENTANYL CITRATE (PF) 250 MCG/5ML IJ SOLN
INTRAMUSCULAR | Status: DC | PRN
  Administered 2020-04-12: 50 ug via INTRAVENOUS

## 2020-04-12 MED ORDER — POLYETHYLENE GLYCOL 3350 17 G PO PACK: 17.0000 g | PACK | Freq: Every day | ORAL | Status: DC | PRN

## 2020-04-12 MED ORDER — DOCUSATE SODIUM 100 MG PO CAPS
100.0000 mg | ORAL_CAPSULE | Freq: Two times a day (BID) | ORAL | Status: DC
  Administered 2020-04-12 (×2): 100 mg via ORAL
  Filled 2020-04-12 (×2): qty 1

## 2020-04-12 MED ORDER — PANTOPRAZOLE SODIUM 40 MG PO TBEC
40.0000 mg | DELAYED_RELEASE_TABLET | Freq: Every day | ORAL | Status: DC
  Administered 2020-04-12: 12:00:00 40 mg via ORAL
  Filled 2020-04-12: qty 1

## 2020-04-12 MED ORDER — BUPIVACAINE-EPINEPHRINE 0.5% -1:200000 IJ SOLN
INTRAMUSCULAR | Status: DC | PRN
  Administered 2020-04-12: 20 mL

## 2020-04-12 MED ORDER — CLONAZEPAM 0.5 MG PO TABS: 1.0000 mg | ORAL_TABLET | Freq: Every evening | ORAL | Status: DC | PRN

## 2020-04-12 MED ORDER — PROPOFOL 10 MG/ML IV BOLUS
INTRAVENOUS | Status: DC | PRN
  Administered 2020-04-12: 20 mg via INTRAVENOUS
  Administered 2020-04-12: 40 mg via INTRAVENOUS

## 2020-04-12 MED ORDER — FENTANYL CITRATE (PF) 100 MCG/2ML IJ SOLN: 25.0000 ug | INTRAMUSCULAR | Status: DC | PRN

## 2020-04-12 MED ORDER — OXYCODONE HCL 5 MG/5ML PO SOLN: 5.0000 mg | Freq: Once | ORAL | Status: DC | PRN

## 2020-04-12 MED ORDER — CHLORHEXIDINE GLUCONATE 0.12 % MT SOLN
OROMUCOSAL | Status: AC
  Administered 2020-04-12: 06:00:00 15 mL via OROMUCOSAL
  Filled 2020-04-12: qty 15

## 2020-04-12 MED ORDER — ACETAMINOPHEN 325 MG PO TABS: 325.0000 mg | ORAL_TABLET | Freq: Four times a day (QID) | ORAL | Status: DC | PRN

## 2020-04-12 MED ORDER — ORAL CARE MOUTH RINSE: 15.0000 mL | Freq: Once | OROMUCOSAL | Status: AC

## 2020-04-12 MED ORDER — PROPOFOL 500 MG/50ML IV EMUL
INTRAVENOUS | Status: DC | PRN
  Administered 2020-04-12: 100 ug/kg/min via INTRAVENOUS

## 2020-04-12 MED ORDER — METHOCARBAMOL 1000 MG/10ML IJ SOLN
500.0000 mg | Freq: Four times a day (QID) | INTRAVENOUS | Status: DC | PRN
  Filled 2020-04-12: qty 5

## 2020-04-12 MED ORDER — ONDANSETRON HCL 4 MG/2ML IJ SOLN
INTRAMUSCULAR | Status: DC | PRN
  Administered 2020-04-12: 4 mg via INTRAVENOUS

## 2020-04-12 MED ORDER — POVIDONE-IODINE 10 % EX SWAB: 2.0000 "application " | Freq: Once | CUTANEOUS | Status: DC

## 2020-04-12 MED ORDER — HYDROCHLOROTHIAZIDE 25 MG PO TABS
25.0000 mg | ORAL_TABLET | Freq: Every day | ORAL | Status: DC
  Administered 2020-04-12: 14:00:00 25 mg via ORAL
  Filled 2020-04-12: qty 1

## 2020-04-12 MED ORDER — CHLORHEXIDINE GLUCONATE 0.12 % MT SOLN: 15.0000 mL | Freq: Once | OROMUCOSAL | Status: AC

## 2020-04-12 MED ORDER — TELMISARTAN-HCTZ 80-25 MG PO TABS: 1.0000 | ORAL_TABLET | Freq: Every day | ORAL | Status: DC

## 2020-04-12 MED ORDER — MIDAZOLAM HCL 2 MG/2ML IJ SOLN
INTRAMUSCULAR | Status: DC | PRN
  Administered 2020-04-12: 2 mg via INTRAVENOUS

## 2020-04-12 MED ORDER — OXYCODONE HCL 5 MG PO TABS
5.0000 mg | ORAL_TABLET | ORAL | Status: DC | PRN
  Administered 2020-04-12 – 2020-04-13 (×4): 10 mg via ORAL
  Filled 2020-04-12 (×4): qty 2

## 2020-04-12 MED ORDER — KETOROLAC TROMETHAMINE 30 MG/ML IJ SOLN
INTRAMUSCULAR | Status: AC
  Filled 2020-04-12: qty 1

## 2020-04-12 MED ORDER — SODIUM CHLORIDE 0.9 % IV SOLN: INTRAVENOUS | Status: DC

## 2020-04-12 MED ORDER — CALCIUM CARBONATE 1250 (500 CA) MG PO TABS
500.0000 mg | ORAL_TABLET | Freq: Two times a day (BID) | ORAL | Status: DC
  Administered 2020-04-12: 17:00:00 500 mg via ORAL
  Filled 2020-04-12 (×2): qty 1

## 2020-04-12 MED ORDER — CEFAZOLIN SODIUM-DEXTROSE 1-4 GM/50ML-% IV SOLN
1.0000 g | Freq: Four times a day (QID) | INTRAVENOUS | Status: AC
  Administered 2020-04-12 (×2): 1 g via INTRAVENOUS
  Filled 2020-04-12 (×2): qty 50

## 2020-04-12 MED ORDER — OXYCODONE HCL 5 MG PO TABS: 5.0000 mg | ORAL_TABLET | Freq: Once | ORAL | Status: DC | PRN

## 2020-04-12 MED ORDER — KETOROLAC TROMETHAMINE 15 MG/ML IJ SOLN
7.5000 mg | Freq: Four times a day (QID) | INTRAMUSCULAR | Status: AC
  Administered 2020-04-12 – 2020-04-13 (×4): 7.5 mg via INTRAVENOUS
  Filled 2020-04-12 (×4): qty 1

## 2020-04-12 MED ORDER — BUPIVACAINE IN DEXTROSE 0.75-8.25 % IT SOLN
INTRATHECAL | Status: DC | PRN
  Administered 2020-04-12: 1.6 mL via INTRATHECAL

## 2020-04-12 MED ORDER — PHENOL 1.4 % MT LIQD: 1.0000 | OROMUCOSAL | Status: DC | PRN

## 2020-04-12 MED ORDER — HYDROMORPHONE HCL 1 MG/ML IJ SOLN
1.0000 mg | INTRAMUSCULAR | Status: DC | PRN
  Administered 2020-04-12 (×2): 1 mg via INTRAVENOUS
  Filled 2020-04-12 (×2): qty 1

## 2020-04-12 MED ORDER — KETOROLAC TROMETHAMINE 15 MG/ML IJ SOLN
INTRAMUSCULAR | Status: AC
  Filled 2020-04-12: qty 1

## 2020-04-12 SURGICAL SUPPLY — 73 items
BANDAGE ESMARK 6X9 LF (GAUZE/BANDAGES/DRESSINGS) ×1 IMPLANT
BLADE SAG 18X100X1.27 (BLADE) ×2 IMPLANT
BNDG ELASTIC 6X10 VLCR STRL LF (GAUZE/BANDAGES/DRESSINGS) ×2 IMPLANT
BNDG ELASTIC 6X5.8 VLCR STR LF (GAUZE/BANDAGES/DRESSINGS) ×4 IMPLANT
BNDG ESMARK 6X9 LF (GAUZE/BANDAGES/DRESSINGS) ×2
BOWL SMART MIX CTS (DISPOSABLE) ×2 IMPLANT
COVER SURGICAL LIGHT HANDLE (MISCELLANEOUS) ×2 IMPLANT
COVER WAND RF STERILE (DRAPES) ×2 IMPLANT
CUFF TOURN SGL QUICK 34 (TOURNIQUET CUFF) ×2
CUFF TOURN SGL QUICK 42 (TOURNIQUET CUFF) IMPLANT
CUFF TRNQT CYL 34X4.125X (TOURNIQUET CUFF) ×1 IMPLANT
DRAPE EXTREMITY T 121X128X90 (DISPOSABLE) ×2 IMPLANT
DRAPE HALF SHEET 40X57 (DRAPES) ×2 IMPLANT
DRAPE U-SHAPE 47X51 STRL (DRAPES) ×2 IMPLANT
DRSG PAD ABDOMINAL 8X10 ST (GAUZE/BANDAGES/DRESSINGS) ×2 IMPLANT
DURAPREP 26ML APPLICATOR (WOUND CARE) ×2 IMPLANT
ELECT CAUTERY BLADE 6.4 (BLADE) ×2 IMPLANT
ELECT REM PT RETURN 9FT ADLT (ELECTROSURGICAL) ×2
ELECTRODE REM PT RTRN 9FT ADLT (ELECTROSURGICAL) ×1 IMPLANT
FACESHIELD WRAPAROUND (MASK) ×4 IMPLANT
FEMORAL POSTERIOR SZ3 LT KNEE (Orthopedic Implant) ×1 IMPLANT
GAUZE SPONGE 4X4 12PLY STRL (GAUZE/BANDAGES/DRESSINGS) ×2 IMPLANT
GAUZE SPONGE 4X4 16PLY XRAY LF (GAUZE/BANDAGES/DRESSINGS) ×2 IMPLANT
GAUZE XEROFORM 1X8 LF (GAUZE/BANDAGES/DRESSINGS) ×2 IMPLANT
GAUZE XEROFORM 5X9 LF (GAUZE/BANDAGES/DRESSINGS) ×2 IMPLANT
GLOVE BIOGEL PI IND STRL 8 (GLOVE) ×2 IMPLANT
GLOVE BIOGEL PI INDICATOR 8 (GLOVE) ×2
GLOVE ORTHO TXT STRL SZ7.5 (GLOVE) ×2 IMPLANT
GLOVE SURG ORTHO 8.0 STRL STRW (GLOVE) ×2 IMPLANT
GOWN STRL REUS W/ TWL LRG LVL3 (GOWN DISPOSABLE) IMPLANT
GOWN STRL REUS W/ TWL XL LVL3 (GOWN DISPOSABLE) ×2 IMPLANT
GOWN STRL REUS W/TWL LRG LVL3 (GOWN DISPOSABLE)
GOWN STRL REUS W/TWL XL LVL3 (GOWN DISPOSABLE) ×4
HANDPIECE INTERPULSE COAX TIP (DISPOSABLE) ×2
IMMOBILIZER KNEE 20 (SOFTGOODS) ×2
IMMOBILIZER KNEE 20 THIGH 36 (SOFTGOODS) ×1 IMPLANT
IMMOBILIZER KNEE 22 UNIV (SOFTGOODS) ×2 IMPLANT
INSERT TIB 3 13XPOST STAB BRNG (Insert) ×1 IMPLANT
INSERT TIB TRIATH PS X3 (Insert) ×2 IMPLANT
INSRT TIB 3 13XPOST STAB BRNG (Insert) ×1 IMPLANT
KIT BASIN OR (CUSTOM PROCEDURE TRAY) ×2 IMPLANT
KIT TURNOVER KIT B (KITS) ×2 IMPLANT
KNEE PATELLA ASYMMETRIC 9X29 (Knees) ×2 IMPLANT
KNEE TIBIAL COMPONENT SZ3 (Knees) ×2 IMPLANT
MANIFOLD NEPTUNE II (INSTRUMENTS) ×2 IMPLANT
NEEDLE 18GX1X1/2 (RX/OR ONLY) (NEEDLE) IMPLANT
NS IRRIG 1000ML POUR BTL (IV SOLUTION) ×2 IMPLANT
PACK TOTAL JOINT (CUSTOM PROCEDURE TRAY) ×2 IMPLANT
PAD ABD 8X10 STRL (GAUZE/BANDAGES/DRESSINGS) ×2 IMPLANT
PAD ARMBOARD 7.5X6 YLW CONV (MISCELLANEOUS) ×2 IMPLANT
PADDING CAST ABS 6INX4YD NS (CAST SUPPLIES) ×1
PADDING CAST ABS COTTON 6X4 NS (CAST SUPPLIES) ×1 IMPLANT
PADDING CAST COTTON 6X4 STRL (CAST SUPPLIES) ×2 IMPLANT
PIN FLUTED HEDLESS FIX 3.5X1/8 (PIN) ×2 IMPLANT
POSTERIOR FEMORAL SZ3 LT KNEE (Orthopedic Implant) ×2 IMPLANT
SET HNDPC FAN SPRY TIP SCT (DISPOSABLE) ×1 IMPLANT
SET PAD KNEE POSITIONER (MISCELLANEOUS) ×2 IMPLANT
STAPLER VISISTAT 35W (STAPLE) IMPLANT
STRIP CLOSURE SKIN 1/2X4 (GAUZE/BANDAGES/DRESSINGS) IMPLANT
SUCTION FRAZIER HANDLE 10FR (MISCELLANEOUS) ×2
SUCTION TUBE FRAZIER 10FR DISP (MISCELLANEOUS) ×1 IMPLANT
SUT MNCRL AB 4-0 PS2 18 (SUTURE) IMPLANT
SUT VIC AB 0 CT1 27 (SUTURE) ×2
SUT VIC AB 0 CT1 27XBRD ANBCTR (SUTURE) ×1 IMPLANT
SUT VIC AB 1 CT1 27 (SUTURE) ×4
SUT VIC AB 1 CT1 27XBRD ANBCTR (SUTURE) ×2 IMPLANT
SUT VIC AB 2-0 CT1 27 (SUTURE) ×4
SUT VIC AB 2-0 CT1 TAPERPNT 27 (SUTURE) ×2 IMPLANT
SYR 50ML LL SCALE MARK (SYRINGE) IMPLANT
TOWEL GREEN STERILE (TOWEL DISPOSABLE) ×2 IMPLANT
TOWEL GREEN STERILE FF (TOWEL DISPOSABLE) ×2 IMPLANT
TRAY CATH 16FR W/PLASTIC CATH (SET/KITS/TRAYS/PACK) IMPLANT
WRAP KNEE MAXI GEL POST OP (GAUZE/BANDAGES/DRESSINGS) ×2 IMPLANT

## 2020-04-12 NOTE — Anesthesia Procedure Notes (Signed)
Anesthesia Regional Block: Adductor canal block   Pre-Anesthetic Checklist: ,, timeout performed, Correct Patient, Correct Site, Correct Laterality, Correct Procedure, Correct Position, site marked, Risks and benefits discussed,  Surgical consent,  Pre-op evaluation,  At surgeon's request and post-op pain management  Laterality: Left  Prep: chloraprep       Needles:  Injection technique: Single-shot  Needle Type: Echogenic Needle     Needle Length: 10cm  Needle Gauge: 21     Additional Needles:   Narrative:  Start time: 04/12/2020 6:55 AM End time: 04/12/2020 6:58 AM Injection made incrementally with aspirations every 5 mL.  Performed by: Personally  Anesthesiologist: Beryle Lathe, MD  Additional Notes: No pain on injection. No increased resistance to injection. Injection made in 5cc increments. Good needle visualization. Patient tolerated the procedure well.

## 2020-04-12 NOTE — Evaluation (Signed)
Physical Therapy Evaluation Patient Details Name: Caroline Keller MRN: [id number] DOB: 06-20-1960 Today's Date: 04/12/2020   History of Present Illness  Pt is a 59 y/o female s/p L TKA. PMH includes HTN.  Clinical Impression  Pt is s/p surgery above with deficits below. Pt with nerve block effects, so mobility limited to chair. Min A for steadying to stand and transfer to chair using RW. Educated about knee precautions. Will continue to follow acutely.     Follow Up Recommendations Follow surgeons recommendation for DC plan and follow-up therapies    Equipment Recommendations  Rolling walker with 5" wheels;3in1 (PT)    Recommendations for Other Services       Precautions / Restrictions Precautions Precautions: Knee Precaution Booklet Issued: No Precaution Comments: Verbally reviewed knee precautions. Required Braces or Orthoses: Knee Immobilizer - Left Restrictions Weight Bearing Restrictions: Yes LLE Weight Bearing: Weight bearing as tolerated      Mobility  Bed Mobility Overal bed mobility: Needs Assistance Bed Mobility: Supine to Sit     Supine to sit: Min assist     General bed mobility comments: Min A for LLE assist. Increased time required.    Transfers Overall transfer level: Needs assistance Equipment used: Rolling walker (2 wheeled) Transfers: Sit to/from [employer] Sit to Stand: Min assist Stand pivot transfers: Min assist       General transfer comment: Min A for lift assist and steadying to stand and transfer to chair. Pt with L knee instability even with use of KI, therefore, mobility limited to chair.  Ambulation/Gait                Stairs            Wheelchair Mobility    Modified Rankin (Stroke Patients Only)       Balance Overall balance assessment: Needs assistance Sitting-balance support: No upper extremity supported;Feet supported Sitting balance-Leahy Scale: Fair     Standing balance support:  Bilateral upper extremity supported;During functional activity Standing balance-Leahy Scale: Poor Standing balance comment: Reliant on BUE support                             Pertinent Vitals/Pain Pain Assessment: 0-10 Pain Score: 5  Pain Location: L knee Pain Descriptors / Indicators: Aching;Operative site guarding Pain Intervention(s): Monitored during session;Limited activity within patient's tolerance;Repositioned    Home Living Family/patient expects to be discharged to:: Private residence Living Arrangements: Spouse/significant other Available Help at Discharge: Family Type of Home: House Home Access: Stairs to enter Entrance Stairs-Rails: Lawyer of Steps: 4 Home Layout: Two level Home Equipment: Environmental consultant - 2 wheels      Prior Function Level of Independence: Independent               Hand Dominance        Extremity/Trunk Assessment   Upper Extremity Assessment Upper Extremity Assessment: Overall WFL for tasks assessed    Lower Extremity Assessment Lower Extremity Assessment: LLE deficits/detail LLE Deficits / Details: Nerve block effects still present. Pt unable to move ankle secondary to nerve block. Other deficits consistent with post op pain and weakness.    Cervical / Trunk Assessment Cervical / Trunk Assessment: Normal  Communication   Communication: No difficulties  Cognition Arousal/Alertness: Awake/alert Behavior During Therapy: WFL for tasks assessed/performed Overall Cognitive Status: Within Functional Limits for tasks assessed  General Comments      Exercises     Assessment/Plan    PT Assessment Patient needs continued PT services  PT Problem List Decreased strength;Decreased range of motion;Decreased balance;Decreased activity tolerance;Decreased mobility;Decreased knowledge of use of DME;Decreased knowledge of precautions;Pain;Impaired  sensation       PT Treatment Interventions DME instruction;Gait training;Functional mobility training;Stair training;Therapeutic activities;Therapeutic exercise;Balance training;Patient/family education    PT Goals (Current goals can be found in the Care Plan section)  Acute Rehab PT Goals Patient Stated Goal: to go home PT Goal Formulation: With patient Time For Goal Achievement: 04/26/20 Potential to Achieve Goals: Good    Frequency 7X/week   Barriers to discharge        Co-evaluation               AM-PAC PT "6 Clicks" Mobility  Outcome Measure Help needed turning from your back to your side while in a flat bed without using bedrails?: None Help needed moving from lying on your back to sitting on the side of a flat bed without using bedrails?: A Little Help needed moving to and from a bed to a chair (including a wheelchair)?: A Little Help needed standing up from a chair using your arms (e.g., wheelchair or bedside chair)?: A Little Help needed to walk in hospital room?: A Lot Help needed climbing 3-5 steps with a railing? : A Lot 6 Click Score: 17    End of Session Equipment Utilized During Treatment: Gait belt Activity Tolerance: Treatment limited secondary to medical complications (Comment) (nerve block) Patient left: in chair;with call bell/phone within reach Nurse Communication: Mobility status PT Visit Diagnosis: Other abnormalities of gait and mobility (R26.89);Pain Pain - Right/Left: Left Pain - part of body: Knee    Time: 2993-7169 PT Time Calculation (min) (ACUTE ONLY): 15 min   Charges:   PT Evaluation $PT Eval Low Complexity: 1 Low          Cindee Salt, DPT  Acute Rehabilitation Services  Pager: [phone] Office: [phone]   Caroline Keller 04/12/2020, 5:10 PM

## 2020-04-12 NOTE — Op Note (Signed)
Caroline Keller, Caroline Keller MEDICAL RECORD [id number] ACCOUNT [id number] DATE OF BIRTH:1961-03-01 FACILITY: [facility] LOCATION: [facility] PHYSICIAN:Takyra Cantrall Aretha Parrot, MD  OPERATIVE REPORT  DATE OF PROCEDURE:  04/12/2020  PREOPERATIVE DIAGNOSES:  Primary osteoarthritis and degenerative joint disease, left knee.  POSTOPERATIVE DIAGNOSES:  Primary osteoarthritis and degenerative joint disease, left knee.  PROCEDURE:  Left total knee arthroplasty.  IMPLANTS:  Stryker Triathlon press-fit knee system with size 3 femur, size 3 tibial tray, 13 mm fixed bearing polyethylene insert, size 29 patellar button.  SURGEON:  Vanita Panda. Magnus Ivan, MD  ASSISTANT:  Hart Carwin, RNFA  ANESTHESIA: 1.  Left lower extremity adductor canal block. 2.  Spinal. 3.  Local with 0.5% Marcaine with epinephrine.  TOURNIQUET TIME:  Less than an hour.  ESTIMATED BLOOD LOSS:  Less than 50 mL.  COMPLICATIONS:  None.  INDICATIONS:  The patient is a 59 year old female well known to me.  She unfortunately has debilitating arthritis involving her left knee.  We have tried and failed all forms of conservative treatment including knee arthroscopy, multiple injections of  steroid and hyaluronic acid.  She has worked on activity modification and weight loss and quad strengthening exercises.  At this point, her left knee pain is daily and it has detrimentally affected her mobility, her quality of life and her activities of  daily living to the point that she does wish to proceed with a total knee arthroplasty.  We talked in length in detail about the risk of acute blood loss anemia, nerve or vessel injury, fracture, infection, dislocation, DVT, implant failure and skin and  soft tissue issues.  We talked about our goals being decreased pain, improve mobility and overall improve quality of life.  DESCRIPTION OF PROCEDURE:  After informed consent was obtained, appropriate left knee was marked.  Anesthesia obtained the left  lower extremity adductor canal block in the holding room and she was brought to the operating room and sat up on the operating  table where spinal anesthesia was obtained.  She was then laid in the supine position on the operating table.  A Foley catheter was placed and nonsterile tourniquet was placed around her upper left thigh.  Her left thigh, knee, leg, ankle and foot were  prepped and draped with DuraPrep and sterile drapes.  A time-out was called and she identified as correct patient, correct left knee.  We then used an Esmarch to wrap that leg and tourniquet was inflated to 300 mm of pressure.  With the knee extended, we  made an incision over the patella and carried this proximally and distally.  I dissected down the knee joint and carried out a medial parapatellar arthrotomy.  There was a very large joint effusion and significant osteophytes in all 3 compartments and  some loose bodies.  There was full-thickness cartilage loss underneath the patella, the trochlea groove and the medial compartment of the knee.  With the knee in a flexed position, we removed remnants of ACL, PCL, medial and lateral meniscus.  We set our  extramedullary cutting guide for taking 9 mm off the high side for our proximal tibia cut, correcting for varus and valgus and neutral slope.  We made this cut without difficulty.  We then went to femur and used an intramedullary cutting guide for our  femur, setting our distal femoral cut for a left knee at 5 degrees externally rotated and an 8 mm distal femoral cut.  We made this cut without difficulty and brought the knee back down to full  extension.  With a 9 mm extension block, she had full  extension and actually slightly hyperextended.  We then went back to the femur and we put our femoral sizing guide based off the epicondylar axis and Whitesides line.  Based off this, we chose a size 3 femur.  We put a 4-in-1 cutting block for a size 3  femur, made our anterior and posterior  cuts, followed by our chamfer cuts.  We then made our femoral box cut.  Attention was then turned back to the tibia.  We chose a size 3 tibial tray for coverage of the tibial plateau setting a rotation off the  tibial tubercle and the femur.  We made our keel punch off of this.  Finding good quality bone, we did this for a press-fit knee and with a size 3 femur and the 3 tibial trial, we trialed a 9 mm fixed bearing polyethylene insert.  We felt that the final  insert would likely be a 13 because she is still slightly hyperextended and I do not think the limb was going to go enough.  I did not want to stretch it out with putting the 13 in just then, but I did put a 11 and still felt the 13 would be the most  appropriate.  We then made our patellar cut and drilled 3 holes for size 29 press-fit patella.  We then removed all instrumentation from the knee and irrigated the knee with normal saline solution using pulsatile lavage.  We dried the knee real well and  then placed our 0.5% Marcaine with epinephrine around the arthrotomy.  With the knee in a flexed position, we dried it real well and then we placed our real Stryker press-fit tibial tray size 3 followed by our size 3 press-fit left femur.  We placed our  real 13 mm fixed bearing polyethylene insert and press-fit our size 29 patellar button.  I put the knee through several cycles of motion.  We were pleased with range of motion and stability.  We then let the tourniquet down and hemostasis obtained with  electrocautery.  We closed the arthrotomy with interrupted #1 Vicryl suture followed by 0 Vicryl to close the deep tissue and 2-0 Vicryl to close subcutaneous tissue.  The skin was reapproximated with staples.  Xeroform and well-padded sterile dressing  was applied.  She was taken to recovery room in stable condition with all final counts being correct and no complications noted.  HN/NUANCE  D:04/12/2020 T:04/12/2020 JOB:[id number]

## 2020-04-12 NOTE — Discharge Instructions (Signed)

## 2020-04-12 NOTE — H&P (Signed)
TOTAL KNEE ADMISSION H&P  Patient is being admitted for left total knee arthroplasty.  Subjective:  Chief Complaint:left knee pain.  HPI: Caroline Keller, 58 y.o. female, has a history of pain and functional disability in the left knee due to arthritis and has failed non-surgical conservative treatments for greater than 12 weeks to includeNSAID's and/or analgesics, corticosteriod injections, viscosupplementation injections, flexibility and strengthening excercises, weight reduction as appropriate and activity modification.  Onset of symptoms was gradual, starting 3 years ago with gradually worsening course since that time. The patient noted prior procedures on the knee to include  arthroscopy on the left knee(s).  Patient currently rates pain in the left knee(s) at 10 out of 10 with activity. Patient has night pain, worsening of pain with activity and weight bearing, pain that interferes with activities of daily living, pain with passive range of motion, crepitus and joint swelling.  Patient has evidence of subchondral sclerosis, periarticular osteophytes and joint space narrowing by imaging studies. There is no active infection.  Patient Active Problem List   Diagnosis Date Noted  . Unilateral primary osteoarthritis, left knee 02/02/2019  . S/P arthroscopy of left knee 11/06/2018  . Unilateral primary osteoarthritis, right knee 03/19/2018  . Hyperlipidemia 02/25/2017  . Chronic left-sided low back pain with left-sided sciatica 09/13/2016  . Trigger thumb, left thumb 09/13/2016  . Trigger thumb, right thumb 09/13/2016  . Substernal chest pain 01/03/2015  . PVC's (premature ventricular contractions) 03/23/2014  . PAC (premature atrial contraction) 03/23/2014  . Benign essential HTN 03/23/2014  . Hepatitis C 12/08/2013  . GERD (gastroesophageal reflux disease)    Past Medical History:  Diagnosis Date  . Anxiety   . Arthritis of low back   . GERD (gastroesophageal reflux disease)   . Hepatitis  C   . History of C-section   . History of cholecystectomy   . History of hiatal hernia   . Hyperlipidemia   . Hypertension   . PAC (premature atrial contraction)   . Palpitations   . PONV (postoperative nausea and vomiting)   . PVC (premature ventricular contraction)     Past Surgical History:  Procedure Laterality Date  . CARDIOVASCULAR STRESS TEST  01/04/2015  . CESAREAN SECTION    . CHOLECYSTECTOMY    . KNEE SURGERY  10/2018    Current Facility-Administered Medications  Medication Dose Route Frequency Provider Last Rate Last Admin  . ceFAZolin (ANCEF) IVPB 2g/100 mL premix  2 g Intravenous On Call to OR Kirtland Bouchard, PA-C      . lactated ringers infusion   Intravenous Continuous Beryle Lathe, MD      . povidone-iodine 10 % swab 2 application  2 application Topical Once Richardean Canal W, PA-C      . tranexamic acid (CYKLOKAPRON) IVPB 1,000 mg  1,000 mg Intravenous To OR Kirtland Bouchard, PA-C       Facility-Administered Medications Ordered in Other Encounters  Medication Dose Route Frequency Provider Last Rate Last Admin  . fentaNYL citrate (PF) (SUBLIMAZE) injection   Intravenous Anesthesia Intra-op Carlos American, CRNA   50 mcg at 04/12/20 0656  . midazolam (VERSED) injection   Intravenous Anesthesia Intra-op Carlos American, CRNA   2 mg at 04/12/20 6606   No Known Allergies  Social History   Tobacco Use  . Smoking status: Former Smoker    Quit date: 04/16/1981    Years since quitting: 39.0  . Smokeless tobacco: Never Used  Substance Use Topics  . Alcohol use:  No    Family History  Problem Relation Age of Onset  . Lung cancer Mother   . Coronary artery disease Father   . Breast cancer Sister   . Colon cancer Neg Hx   . Rectal cancer Neg Hx   . Stomach cancer Neg Hx   . Pancreatic cancer Neg Hx   . Esophageal cancer Neg Hx      Review of Systems  Musculoskeletal: Positive for joint swelling.  All other systems reviewed and are  negative.   Objective:  Physical Exam Vitals reviewed.  Constitutional:      Appearance: Normal appearance.  HENT:     Head: Normocephalic and atraumatic.  Eyes:     Extraocular Movements: Extraocular movements intact.     Pupils: Pupils are equal, round, and reactive to light.  Cardiovascular:     Rate and Rhythm: Normal rate and regular rhythm.     Pulses: Normal pulses.  Pulmonary:     Effort: Pulmonary effort is normal.     Breath sounds: Normal breath sounds.  Abdominal:     Palpations: Abdomen is soft.  Musculoskeletal:     Cervical back: Normal range of motion and neck supple.     Left knee: Effusion, bony tenderness and crepitus present. Decreased range of motion. Tenderness present over the medial joint line, lateral joint line and patellar tendon. Abnormal alignment and abnormal meniscus.  Neurological:     Mental Status: She is alert and oriented to person, place, and time.  Psychiatric:        Behavior: Behavior normal.     Vital signs in last 24 hours: Temp:  [98 F (36.7 C)] 98 F (36.7 C) (12/28 0545) Pulse Rate:  [76] 76 (12/28 0545) Resp:  [17] 17 (12/28 0545) BP: (130)/(81) 130/81 (12/28 0545) SpO2:  [99 %] 99 % (12/28 0545) Weight:  [94.6 kg] 94.6 kg (12/28 0545)  Labs:   Estimated body mass index is 34.71 kg/m as calculated from the following:   Height as of this encounter: 5\' 5"  (1.651 m).   Weight as of this encounter: 94.6 kg.   Imaging Review Plain radiographs demonstrate severe degenerative joint disease of the left knee(s). The overall alignment ismild varus. The bone quality appears to be excellent for age and reported activity level.      Assessment/Plan:  End stage arthritis, left knee   The patient history, physical examination, clinical judgment of the provider and imaging studies are consistent with end stage degenerative joint disease of the left knee(s) and total knee arthroplasty is deemed medically necessary. The  treatment options including medical management, injection therapy arthroscopy and arthroplasty were discussed at length. The risks and benefits of total knee arthroplasty were presented and reviewed. The risks due to aseptic loosening, infection, stiffness, patella tracking problems, thromboembolic complications and other imponderables were discussed. The patient acknowledged the explanation, agreed to proceed with the plan and consent was signed. Patient is being admitted for inpatient treatment for surgery, pain control, PT, OT, prophylactic antibiotics, VTE prophylaxis, progressive ambulation and ADL's and discharge planning. The patient is planning to be discharged home with home health services

## 2020-04-12 NOTE — Transfer of Care (Signed)
Immediate Anesthesia Transfer of Care Note  Patient: Caroline Keller  Procedure(s) Performed: LEFT TOTAL KNEE ARTHROPLASTY (Left Knee)  Patient Location: PACU  Anesthesia Type:MAC combined with regional for post-op pain  Level of Consciousness: awake, alert , oriented and patient cooperative  Airway & Oxygen Therapy: Patient Spontanous Breathing and Patient connected to face mask oxygen  Post-op Assessment: Report given to RN and Post -op Vital signs reviewed and stable  Post vital signs: Reviewed and stable  Last Vitals:  Vitals Value Taken Time  BP    Temp    Pulse    Resp    SpO2      Last Pain:  Vitals:   04/12/20 0545  TempSrc: Oral         Complications: No complications documented.

## 2020-04-12 NOTE — Anesthesia Procedure Notes (Signed)
Spinal  Patient location during procedure: OR Start time: 04/12/2020 7:31 AM End time: 04/12/2020 7:35 AM Staffing Performed: anesthesiologist  Anesthesiologist: Beryle Lathe, MD Preanesthetic Checklist Completed: patient identified, IV checked, risks and benefits discussed, surgical consent, monitors and equipment checked, pre-op evaluation and timeout performed Spinal Block Patient position: sitting Prep: DuraPrep Patient monitoring: heart rate, cardiac monitor, continuous pulse ox and blood pressure Approach: midline Location: L3-4 Injection technique: single-shot Needle Needle type: Quincke  Needle gauge: 22 G Additional Notes Consent was obtained prior to the procedure with all questions answered and concerns addressed. Risks including, but not limited to, bleeding, infection, nerve damage, paralysis, failed block, inadequate analgesia, allergic reaction, high spinal, itching, and headache were discussed and the patient wished to proceed. Functioning IV was confirmed and monitors were applied. Sterile prep and drape, including hand hygiene, mask, and sterile gloves were used. The patient was positioned and the spine was prepped. The skin was anesthetized with lidocaine. Free flow of clear CSF was obtained prior to injecting local anesthetic into the CSF. The spinal needle aspirated freely following injection. The needle was carefully withdrawn. The patient tolerated the procedure well.   Leslye Peer, MD

## 2020-04-12 NOTE — Anesthesia Postprocedure Evaluation (Signed)
Anesthesia Post Note  Patient: Caroline Keller  Procedure(s) Performed: LEFT TOTAL KNEE ARTHROPLASTY (Left Knee)     Patient location during evaluation: PACU Anesthesia Type: Spinal Level of consciousness: awake and alert Pain management: pain level controlled Vital Signs Assessment: post-procedure vital signs reviewed and stable Respiratory status: spontaneous breathing and respiratory function stable Cardiovascular status: blood pressure returned to baseline and stable Postop Assessment: spinal receding and no apparent nausea or vomiting Anesthetic complications: no   No complications documented.  Last Vitals:  Vitals:   04/12/20 1018 04/12/20 1031  BP:  121/76  Pulse: 64 65  Resp: (!) 21 18  Temp: (!) 36.2 C (!) 36.4 C  SpO2: 94% 94%    Last Pain:  Vitals:   04/12/20 1031  TempSrc: Oral  PainSc:                  Beryle Lathe

## 2020-04-12 NOTE — Brief Op Note (Signed)
04/12/2020  8:52 AM  PATIENT:  Caroline Keller  59 y.o. female  PRE-OPERATIVE DIAGNOSIS:  osteoarthritis left knee  POST-OPERATIVE DIAGNOSIS:  osteoarthritis left knee  PROCEDURE:  Procedure(s) with comments: LEFT TOTAL KNEE ARTHROPLASTY (Left) - RNFA  SURGEON:  Surgeon(s) and Role:    Kathryne Hitch, MD - Primary  ASSISTANTS: Hart Carwin, RNFA   ANESTHESIA:   local, regional and spinal  EBL:  20 mL   COUNTS:  YES  TOURNIQUET:   Total Tourniquet Time Documented: Thigh (Left) - 39 minutes Total: Thigh (Left) - 39 minutes   DICTATION: .Other Dictation: Dictation Number [id number]  PLAN OF CARE: Admit for overnight observation  PATIENT DISPOSITION:  PACU - hemodynamically stable.   Delay start of Pharmacological VTE agent (>24hrs) due to surgical blood loss or risk of bleeding: no

## 2020-04-13 ENCOUNTER — Encounter (HOSPITAL_COMMUNITY): Payer: Self-pay | Admitting: Orthopaedic Surgery

## 2020-04-13 DIAGNOSIS — M1712 Unilateral primary osteoarthritis, left knee: Secondary | ICD-10-CM | POA: Diagnosis not present

## 2020-04-13 LAB — CBC
HCT: 33.7 % — ABNORMAL LOW (ref 36.0–46.0)
Hemoglobin: 10.8 g/dL — ABNORMAL LOW (ref 12.0–15.0)
MCH: 32.8 pg (ref 26.0–34.0)
MCHC: 32 g/dL (ref 30.0–36.0)
MCV: 102.4 fL — ABNORMAL HIGH (ref 80.0–100.0)
Platelets: 228 10*3/uL (ref 150–400)
RBC: 3.29 MIL/uL — ABNORMAL LOW (ref 3.87–5.11)
RDW: 12.7 % (ref 11.5–15.5)
WBC: 10.7 10*3/uL — ABNORMAL HIGH (ref 4.0–10.5)
nRBC: 0 % (ref 0.0–0.2)

## 2020-04-13 LAB — BASIC METABOLIC PANEL
Anion gap: 10 (ref 5–15)
BUN: 34 mg/dL — ABNORMAL HIGH (ref 8–23)
CO2: 23 mmol/L (ref 22–32)
Calcium: 8.8 mg/dL — ABNORMAL LOW (ref 8.9–10.3)
Chloride: 100 mmol/L (ref 98–111)
Creatinine, Ser: 1.64 mg/dL — ABNORMAL HIGH (ref 0.44–1.00)
GFR, Estimated: 35 mL/min — ABNORMAL LOW (ref >60)
Glucose, Bld: 135 mg/dL — ABNORMAL HIGH (ref 70–99)
Potassium: 5.4 mmol/L — ABNORMAL HIGH (ref 3.5–5.1)
Sodium: 133 mmol/L — ABNORMAL LOW (ref 135–145)

## 2020-04-13 MED ORDER — TIZANIDINE HCL 4 MG PO TABS: 4.0000 mg | ORAL_TABLET | Freq: Three times a day (TID) | ORAL | 1 refills | Status: DC | PRN

## 2020-04-13 MED ORDER — ONDANSETRON 4 MG PO TBDP: 4.0000 mg | ORAL_TABLET | Freq: Three times a day (TID) | ORAL | 1 refills | Status: DC | PRN

## 2020-04-13 MED ORDER — ASPIRIN 81 MG PO CHEW: 81.0000 mg | CHEWABLE_TABLET | Freq: Two times a day (BID) | ORAL | 1 refills | Status: DC

## 2020-04-13 MED ORDER — OXYCODONE HCL 5 MG PO TABS
5.0000 mg | ORAL_TABLET | ORAL | 0 refills | Status: DC | PRN
Start: 2020-04-13 — End: 2020-04-18

## 2020-04-13 NOTE — Progress Notes (Signed)
Physical Therapy Treatment Patient Details Name: Caroline Keller MRN: [id number] DOB: 01-20-1961 Today's Date: 04/13/2020    History of Present Illness Pt is a 59 y/o female s/p L TKA. PMH includes HTN.    PT Comments    Pt fully participated in session. Pt demonstrating increased numbness and foot drop on LLE, RN and MD made aware. Pt able to perform HEP with handout. Pt able to perform SLR and LAQ without leg, education to use knee immobilizer if feeling less stable, but pt able to ambulate with S wtihout knee immobilizer. Pt educated on positioning and use of ice, verbalized understanding. Pt educated on safety with ambulation due to foot drop to decrease fall risk. Pt educated with demonstration on stair negotiation and need for husband to be present, verbalized understanding. Pt is safe to d/c home with family care and follow up therapy to progress limitations in ROM, strength, coordination, gait, endurance and safety to maximize independence with functional mobility.   Follow Up Recommendations  Follow surgeon's recommendation for DC plan and follow-up therapies     Equipment Recommendations       Recommendations for Other Services       Precautions / Restrictions Precautions Precautions: Knee Precaution Booklet Issued: Yes (comment) Required Braces or Orthoses: Knee Immobilizer - Left Knee Immobilizer - Left:  (pt able to perform SLR and LAQ withoutout lag, pt educated on use of KI if feels less stable. pt able to ambulate with S without KI) Restrictions Weight Bearing Restrictions: Yes LLE Weight Bearing: Weight bearing as tolerated    Mobility  Bed Mobility Overal bed mobility: Modified Independent Bed Mobility: Supine to Sit     Supine to sit: HOB elevated        Transfers Overall transfer level: Needs assistance Equipment used: Rolling walker (2 wheeled) Transfers: Sit to/from Stand Sit to Stand: Supervision            Ambulation/Gait Ambulation/Gait  assistance: Supervision Gait Distance (Feet): 100 Feet Assistive device: Rolling walker (2 wheeled)       General Gait Details: pt demonstrating foot drop on L with increased numbness, RN and MD notified. Pt educated on compensating wtih increase hip flexion to decrease fall risk due to foot drop. Pt wtih decreased weight bearing through LLE requiring rest breaks due to UE fatigue. pt able to perform step through gait pattern.   Stairs    education and demonstration with use of L handrail, education and demonstration of curb with RW, verbalized understanding         Wheelchair Mobility    Modified Rankin (Stroke Patients Only)       Balance Overall balance assessment: Needs assistance Sitting-balance support: No upper extremity supported;Feet supported Sitting balance-Leahy Scale: Fair     Standing balance support: No upper extremity supported Standing balance-Leahy Scale: Fair                              Cognition                                              Exercises Total Joint Exercises Ankle Circles/Pumps: AAROM;Left;AROM;Right;10 reps;Supine Quad Sets: AROM;Left;10 reps;Supine Towel Squeeze: AROM;Both;10 reps;Supine Short Arc Quad: AROM;Left;10 reps;Supine Heel Slides: AROM;Left;10 reps;Supine Hip ABduction/ADduction: AROM;Left;10 reps;Supine Straight Leg Raises: AROM;Left;10 reps;Supine Long Arc Quad: AROM;Left;10 reps;Seated Knee  Flexion: AROM;Left;10 reps;Seated Goniometric ROM: knee extension lacking 4 degrees, knee flexion approx 80 degrees    General Comments  education on positioning and ice, education on use of knee immobilizer if needed3      Pertinent Vitals/Pain Pain Assessment: Faces Faces Pain Scale: Hurts little more Pain Location: L knee    Home Living                      Prior Function            PT Goals (current goals can now be found in the care plan section) Acute Rehab PT  Goals Patient Stated Goal: to go home PT Goal Formulation: With patient Time For Goal Achievement: 04/26/20 Potential to Achieve Goals: Good Progress towards PT goals: Progressing toward goals    Frequency    7X/week      PT Plan Current plan remains appropriate    Co-evaluation              AM-PAC PT "6 Clicks" Mobility   Outcome Measure  Help needed turning from your back to your side while in a flat bed without using bedrails?: None Help needed moving from lying on your back to sitting on the side of a flat bed without using bedrails?: None Help needed moving to and from a bed to a chair (including a wheelchair)?: A Little Help needed standing up from a chair using your arms (e.g., wheelchair or bedside chair)?: A Little Help needed to walk in hospital room?: A Little Help needed climbing 3-5 steps with a railing? : A Little 6 Click Score: 20    End of Session Equipment Utilized During Treatment: Gait belt Activity Tolerance: Patient tolerated treatment well Patient left: in chair;with call bell/phone within reach Nurse Communication: Mobility status PT Visit Diagnosis: Other abnormalities of gait and mobility (R26.89);Pain Pain - Right/Left: Left Pain - part of body: Knee     Time: 4196-2229 PT Time Calculation (min) (ACUTE ONLY): 28 min  Charges:  $Gait Training: 8-22 mins $Therapeutic Exercise: 8-22 mins                     Ginette Otto, DPT Acute Rehabilitation Services [phone]   Lucretia Field 04/13/2020, 10:20 AM

## 2020-04-13 NOTE — Discharge Summary (Signed)
Patient ID: Caroline Keller MRN: [id number] DOB/AGE: December 23, 1960 59 y.o.  Admit date: 04/12/2020 Discharge date: 04/13/2020  Admission Diagnoses:  Principal Problem:   Unilateral primary osteoarthritis, left knee Active Problems:   Status post total left knee replacement   Discharge Diagnoses:  Same  Past Medical History:  Diagnosis Date  . Anxiety   . Arthritis of low back   . GERD (gastroesophageal reflux disease)   . Hepatitis C   . History of C-section   . History of cholecystectomy   . History of hiatal hernia   . Hyperlipidemia   . Hypertension   . PAC (premature atrial contraction)   . Palpitations   . PONV (postoperative nausea and vomiting)   . PVC (premature ventricular contraction)     Surgeries: Procedure(s): LEFT TOTAL KNEE ARTHROPLASTY on 04/12/2020   Consultants:   Discharged Condition: Improved  Hospital Course: Stephen Turnbaugh is an 59 y.o. female who was admitted 04/12/2020 for operative treatment ofUnilateral primary osteoarthritis, left knee. Patient has severe unremitting pain that affects sleep, daily activities, and work/hobbies. After pre-op clearance the patient was taken to the operating room on 04/12/2020 and underwent  Procedure(s): LEFT TOTAL KNEE ARTHROPLASTY.    Patient was given perioperative antibiotics:  Anti-infectives (From admission, onward)   Start     Dose/Rate Route Frequency Ordered Stop   04/12/20 1330  ceFAZolin (ANCEF) IVPB 1 g/50 mL premix        1 g 100 mL/hr over 30 Minutes Intravenous Every 6 hours 04/12/20 1027 04/12/20 2010   04/12/20 0600  ceFAZolin (ANCEF) IVPB 2g/100 mL premix        2 g 200 mL/hr over 30 Minutes Intravenous On call to O.R. 04/12/20 0541 04/12/20 0740       Patient was given sequential compression devices, early ambulation, and chemoprophylaxis to prevent DVT.  Patient benefited maximally from hospital stay and there were no complications.    Recent vital signs:  Patient Vitals for the past 24  hrs:  BP Temp Temp src Pulse Resp SpO2  04/13/20 0450 106/60 99.9 F (37.7 C) Oral 100 18 96 %  04/12/20 2306 118/72 98.6 F (37 C) Oral 74 20 99 %  04/12/20 1958 104/77 99.2 F (37.3 C) Oral 75 16 91 %  04/12/20 1600 96/74 98.3 F (36.8 C) Oral 79 18 97 %  04/12/20 1031 121/76 (!) 97.5 F (36.4 C) Oral 65 18 94 %  04/12/20 1018 -- (!) 97.1 F (36.2 C) -- 64 (!) 21 94 %  04/12/20 0955 112/76 -- -- (!) 58 17 94 %  04/12/20 0940 107/67 -- -- (!) 59 18 94 %  04/12/20 0930 -- -- -- 60 19 96 %  04/12/20 0925 109/64 -- -- 62 (!) 21 98 %  04/12/20 0910 107/73 (!) 97.1 F (36.2 C) -- 62 16 93 %     Recent laboratory studies:  Recent Labs    04/13/20 0440  WBC 10.7*  HGB 10.8*  HCT 33.7*  PLT 228  NA 133*  K 5.4*  CL 100  CO2 23  BUN 34*  CREATININE 1.64*  GLUCOSE 135*  CALCIUM 8.8*     Discharge Medications:   Allergies as of 04/13/2020   No Known Allergies     Medication List    STOP taking these medications   Acetaminophen-Codeine 300-30 MG tablet     TAKE these medications   aspirin 81 MG chewable tablet Commonly known as: Aspirin Childrens Chew 1 tablet (81 mg total)  by mouth 2 (two) times daily after a meal.   atorvastatin 10 MG tablet Commonly known as: LIPITOR Take 1 tablet (10 mg total) by mouth daily.   calcium carbonate 1500 (600 Ca) MG Tabs tablet Commonly known as: OSCAL Take 600 mg of elemental calcium by mouth 2 (two) times daily.   clonazePAM 1 MG tablet Commonly known as: KLONOPIN Take 1 tablet by mouth at bedtime as needed (sleep/anxiety).   diltiazem 360 MG 24 hr capsule Commonly known as: CARDIZEM CD TAKE 1 CAPSULE (360 MG TOTAL) EVERY MORNING BY MOUTH. What changed:   how much to take  how to take this  when to take this  additional instructions   escitalopram 20 MG tablet Commonly known as: LEXAPRO Take 20 mg by mouth daily.   ibuprofen 200 MG tablet Commonly known as: ADVIL Take 400 mg by mouth every 6 (six) hours  as needed for headache or moderate pain.   ondansetron 4 MG disintegrating tablet Commonly known as: Zofran ODT Take 1 tablet (4 mg total) by mouth every 8 (eight) hours as needed for nausea or vomiting.   oxyCODONE 5 MG immediate release tablet Commonly known as: Oxy IR/ROXICODONE Take 1-2 tablets (5-10 mg total) by mouth every 4 (four) hours as needed for moderate pain (pain score 4-6).   pantoprazole 20 MG tablet Commonly known as: PROTONIX Take 20 mg by mouth daily.   telmisartan-hydrochlorothiazide 80-25 MG tablet Commonly known as: MICARDIS HCT Take 1 tablet by mouth daily.   tiZANidine 4 MG tablet Commonly known as: Zanaflex Take 1 tablet (4 mg total) by mouth every 8 (eight) hours as needed for muscle spasms.            Durable Medical Equipment  (From admission, onward)         Start     Ordered   04/12/20 1028  DME 3 n 1  Once        04/12/20 1027   04/12/20 1028  DME Walker rolling  Once       Question Answer Comment  Walker: With 5 Inch Wheels   Patient needs a walker to treat with the following condition Status post total left knee replacement      04/12/20 1027          Diagnostic Studies: DG Knee Left Port  Result Date: 04/12/2020 CLINICAL DATA:  Post left total knee replacement. EXAM: PORTABLE LEFT KNEE - 1-2 VIEW COMPARISON:  02/03/2020 FINDINGS: Post left total knee replacement. Alignment appears anatomic. No fracture or dislocation. There is an expected small knee joint effusion with associated intra-articular air. Midline skin staples. No radiopaque foreign body. IMPRESSION: Post left total knee replacement without evidence of complication. Electronically Signed   By: Simonne Come M.D.   On: 04/12/2020 09:25    Disposition: Discharge disposition: 01-Home or Self Care          Follow-up Information    Kathryne Hitch, MD Follow up in 2 week(s).   Specialty: Orthopedic Surgery Contact information: [address] [place]  [place] [address] [phone]                Signed: Kathryne Hitch 04/13/2020, 7:42 AM

## 2020-04-13 NOTE — Progress Notes (Signed)
Patient ID: Caroline Keller, female   DOB: 07-15-1960, 59 y.o.   MRN: [id number] Tolerated knee replacement well.  Awake and alert this am.  Left knee dressing changed.  Calf soft.  Hgb only slightly down - acute blood loss anemia.  Creatinine bumped up - slight acute renal insuff likely from acute anemia and toradol.  Should recover with hydration.  Can discharge to home today.

## 2020-04-13 NOTE — Plan of Care (Signed)
Patient alert and oriented, mae's well, voiding adequate amount of urine, swallowing without difficulty, no c/o pain at time of discharge. Patient discharged home with family. Script and discharged instructions given to patient. Patient and family stated understanding of instructions given. Patient has an appointment with Dr. Blackman   

## 2020-04-14 ENCOUNTER — Telehealth: Payer: Self-pay | Admitting: Orthopaedic Surgery

## 2020-04-14 MED ORDER — HYDROMORPHONE HCL 2 MG PO TABS: 2.0000 mg | ORAL_TABLET | ORAL | 0 refills | Status: DC | PRN

## 2020-04-14 NOTE — Telephone Encounter (Signed)
Please advise 

## 2020-04-14 NOTE — Telephone Encounter (Signed)
Patient called stating medication needs to be stronger. Patient states leg is hot to the touch. Patient is asking for a call back to see about either up the dosage or calling in a stronger medication. Patient phone number is [phone].

## 2020-04-14 NOTE — Telephone Encounter (Signed)
Lvm for pt to call back to inform 

## 2020-04-14 NOTE — Telephone Encounter (Signed)
Let her know that I did send in Dilaudid which is stronger to her pharmacy.  Also let her know that the knee will definitely be quite warm given the surgery and the blood that develops in the knee from knee replacement surgery.  Icing several times a day would be appropriate.

## 2020-04-18 ENCOUNTER — Other Ambulatory Visit: Payer: Self-pay | Admitting: Physician Assistant

## 2020-04-18 MED ORDER — ONDANSETRON 4 MG PO TBDP: 4.0000 mg | ORAL_TABLET | Freq: Three times a day (TID) | ORAL | 1 refills | Status: DC | PRN

## 2020-04-18 MED ORDER — OXYCODONE HCL 5 MG PO TABS: 5.0000 mg | ORAL_TABLET | ORAL | 0 refills | Status: DC | PRN

## 2020-04-26 ENCOUNTER — Ambulatory Visit (INDEPENDENT_AMBULATORY_CARE_PROVIDER_SITE_OTHER): Payer: PRIVATE HEALTH INSURANCE | Admitting: Orthopaedic Surgery

## 2020-04-26 ENCOUNTER — Encounter: Payer: Self-pay | Admitting: Orthopaedic Surgery

## 2020-04-26 DIAGNOSIS — Z96652 Presence of left artificial knee joint: Secondary | ICD-10-CM

## 2020-04-26 MED ORDER — ONDANSETRON 4 MG PO TBDP: 4.0000 mg | ORAL_TABLET | Freq: Three times a day (TID) | ORAL | 0 refills | Status: DC | PRN

## 2020-04-26 NOTE — Progress Notes (Signed)
HPI: Mrs. Schuur returns today status post left total knee arthroplasty 2 weeks postop.  She is overall doing well.  She has had no formal therapy or request.  She notes she still has some nausea with taking pain medicine and is asking for refill on the Zofran.  She has had no fevers chills shortness of breath chest pain.  She does note some numbness in her left foot and decreased dorsiflexion.  Physical exam: Left knee surgical incisions well approximated staples no signs of infection.  Calf supple nontender.  She has full extension and flexion to 110 degrees.  Dorsiflexion of the left foot is slightly decreased compared to the right side but she is able to dorsiflex.  Impression: Status post left total knee arthroplasty 04/12/2020  Plan: We will send her to formal physical therapy to work on range of motion strengthening and a home exercise program for her knee.  We will also have him work on her left foot drop.  See her back in 1 month sooner if there is any questions concerns.  Refill on Zofran was given.  She will stay on the 81 mg aspirin once daily for another week and then discontinue as she was on no aspirin prior to surgery.  Questions encouraged and answered at length.  Staples removed Steri-Strips applied.

## 2020-04-27 ENCOUNTER — Other Ambulatory Visit: Payer: Self-pay | Admitting: Physician Assistant

## 2020-04-27 MED ORDER — OXYCODONE HCL 5 MG PO TABS: 5.0000 mg | ORAL_TABLET | ORAL | 0 refills | Status: DC | PRN

## 2020-05-09 ENCOUNTER — Other Ambulatory Visit: Payer: Self-pay

## 2020-05-09 ENCOUNTER — Encounter: Payer: Self-pay | Admitting: Orthopaedic Surgery

## 2020-05-09 DIAGNOSIS — Z96652 Presence of left artificial knee joint: Secondary | ICD-10-CM

## 2020-05-09 MED ORDER — OXYCODONE HCL 5 MG PO TABS: 5.0000 mg | ORAL_TABLET | ORAL | 0 refills | Status: DC | PRN

## 2020-05-09 MED ORDER — ONDANSETRON 4 MG PO TBDP: 4.0000 mg | ORAL_TABLET | Freq: Three times a day (TID) | ORAL | 0 refills | Status: DC | PRN

## 2020-05-09 NOTE — Telephone Encounter (Signed)
CB.[email or web address]

## 2020-05-10 ENCOUNTER — Encounter: Payer: Self-pay | Admitting: Physical Therapy

## 2020-05-10 ENCOUNTER — Ambulatory Visit (INDEPENDENT_AMBULATORY_CARE_PROVIDER_SITE_OTHER): Payer: PRIVATE HEALTH INSURANCE | Admitting: Physical Therapy

## 2020-05-10 ENCOUNTER — Other Ambulatory Visit: Payer: Self-pay

## 2020-05-10 DIAGNOSIS — R2689 Other abnormalities of gait and mobility: Secondary | ICD-10-CM | POA: Diagnosis not present

## 2020-05-10 DIAGNOSIS — M25662 Stiffness of left knee, not elsewhere classified: Secondary | ICD-10-CM

## 2020-05-10 DIAGNOSIS — M25562 Pain in left knee: Secondary | ICD-10-CM | POA: Diagnosis not present

## 2020-05-10 DIAGNOSIS — M6281 Muscle weakness (generalized): Secondary | ICD-10-CM

## 2020-05-10 DIAGNOSIS — R600 Localized edema: Secondary | ICD-10-CM

## 2020-05-10 NOTE — Patient Instructions (Signed)
Access Code: [email or web address] URL: [email or web address] Date: 05/10/2020 Prepared by: Ivery Quale  Exercises Supine Hamstring Stretch with Strap - 2 x daily - 7 x weekly - 1 sets - 4 reps - 30 hold Straight Leg Raise - 2 x daily - 7 x weekly - 3 sets - 10 reps Supine Quad Set - 2 x daily - 7 x weekly - 2 sets - 10 reps - 5 hold Seated Knee Flexion Stretch - 2 x daily - 7 x weekly - 3 sets - 10 reps Heel Prop - 2 x daily - 7 x weekly - 3 sets - 10 reps Seated Ankle Alphabet - 2 x daily - 7 x weekly - 1-2 sets

## 2020-05-10 NOTE — Therapy (Signed)
[facility] [address] [place], [place], [address] Phone: [phone]   Fax:  [phone]  Physical Therapy Evaluation  Patient Details  Name: Caroline Keller MRN: [id number] Date of Birth: [birth year] Referring Provider (PT): Doneen Poisson, MD   Encounter Date: 05/10/2020   PT End of Session - 05/10/20 1522    Visit Number 1    Number of Visits 16    Date for PT Re-Evaluation 07/05/20    PT Start Time 1414    PT Stop Time 1505    PT Time Calculation (min) 51 min    Activity Tolerance Patient tolerated treatment well;Patient limited by pain    Behavior During Therapy [facility] for tasks assessed/performed           Past Medical History:  Diagnosis Date  . Anxiety   . Arthritis of low back   . GERD (gastroesophageal reflux disease)   . Hepatitis C   . History of C-section   . History of cholecystectomy   . History of hiatal hernia   . Hyperlipidemia   . Hypertension   . PAC (premature atrial contraction)   . Palpitations   . PONV (postoperative nausea and vomiting)   . PVC (premature ventricular contraction)     Past Surgical History:  Procedure Laterality Date  . CARDIOVASCULAR STRESS TEST  01/04/2015  . CESAREAN SECTION    . CHOLECYSTECTOMY    . KNEE SURGERY  10/2018  . TOTAL KNEE ARTHROPLASTY Left 04/12/2020   Procedure: LEFT TOTAL KNEE ARTHROPLASTY;  Surgeon: Kathryne Hitch, MD;  Location: [facility] OR;  Service: Orthopedics;  Laterality: Left;  RNFA    There were no vitals filed for this visit.    Subjective Assessment - 05/10/20 1430    Subjective Paient reports feeling pretty good since her surgery but has noted some fatigue after ~10 minutes of walking or activity and she has to rest and sit down. Patient is taking prescribed medication for pain and is managing it well with ice when needed. She did relay that the medication has been making her nauseous at times but lying down makes it feel better. She has been  getting up moving when she can to loosen things up. She is not currently working. She has no pain with flexion but pain with extension and limited. She aso is having trouble lifting her left foot into dorsiflexion and feels numbness on the lateral aspect of her lower left leg into her toes that has not subsided since surgery. She did note some pain with showering and has been able to use the stairs one foot at a time.    Limitations Standing;Walking;House hold activities    How long can you stand comfortably? 10 mins    How long can you walk comfortably? 10 mins before fatigue    Patient Stated Goals get back to work-standing 5 hours, be able to accomplish daily tasks    Currently in Pain? No/denies   patient denies any pain in sitting but does have aching with moving leg into extension.             [facility] PT Assessment - 05/10/20 0001      Assessment   Medical Diagnosis Left TKA    Referring Provider (PT) Doneen Poisson, MD    Onset Date/Surgical Date 04/12/21    Next MD Visit 1 month out      Precautions   Precautions Knee    Precaution Comments WBAT      Restrictions  Weight Bearing Restrictions No    Other Position/Activity Restrictions --   WBAT     Balance Screen   Has the patient fallen in the past 6 months Yes    How many times? a few imes    Has the patient had a decrease in activity level because of a fear of falling?  No    Is the patient reluctant to leave their home because of a fear of falling?  No      Home Environment   Living Environment Private residence    Living Arrangements Spouse/significant other    Available Help at Discharge Family    Type of Home House    Home Access Stairs to enter    Home Layout Two level    Additional Comments stairs going up to bedroom have railings, 13 steps  has been ascending stairs one foot at a time.      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     Cognition   Overall Cognitive Status Within Functional Limits for tasks assessed      Observation/Other Assessments   Observations incision looks like its healing well, minimal redness, and no warmth    Focus on Therapeutic Outcomes (FOTO)  functional intake score 35% predicted 55%      Observation/Other Assessments-Edema    Edema Circumferential   mid patella R-16.75 in, L-17.5 in     ROM / Strength   AROM / PROM / Strength AROM;PROM;Strength      AROM   AROM Assessment Site Knee;Ankle    Right/Left Knee Right    Right Knee Extension -14   painful   Right Knee Flexion 125    Right/Left Ankle Right    Right Ankle Dorsiflexion 7    Right Ankle Plantar Flexion --   WNL   Right Ankle Inversion 135    Right Ankle Eversion 0   full PROM     PROM   PROM Assessment Site Knee    Right/Left Knee Left    Left Knee Flexion 129      Strength   Overall Strength Comments R side hip flex 4-/5, knee flex 4-/5, all other [facility]    Strength Assessment Site Hip;Knee;Ankle    Right/Left Hip Left    Left Hip Flexion 4-/5    Left Hip Extension 3+/5    Right/Left Knee Left    Left Knee Flexion 4-/5    Left Knee Extension 4-/5    Right/Left Ankle Left    Left Ankle Dorsiflexion 3/5    Left Ankle Plantar Flexion 4+/5    Left Ankle Inversion 4+/5    Left Ankle Eversion 2-/5      Palpation   Patella mobility patellar mobility all directions feel mobile no stiffness indicated      Ambulation/Gait   Gait Pattern Decreased stance time - left;Decreased step length - left;Decreased arm swing - left;Step-to pattern;Decreased dorsiflexion - left;Antalgic;Poor foot clearance - left;Left circumduction   patient has significant toeing in with foot drop, compensating with increase hip flexion and circumduction                     Objective measurements completed on examination: See above findings.       [facility]  Treatment/Exercise - 05/10/20 0001      Manual Therapy  Manual Therapy Joint mobilization    Manual therapy comments grade 1-2 mobs to increase knee extension x38mins                  PT Education - 05/10/20 1521    Education Details HEP, POC    Person(s) Educated Patient    Methods Explanation;Demonstration;Handout    Comprehension Verbalized understanding;Returned demonstration            PT Short Term Goals - 05/10/20 1523      PT SHORT TERM GOAL #1   Title Patient will be I with HEP    Time 4    Period Weeks    Status New    Target Date 06/07/20      PT SHORT TERM GOAL #2   Title Patient will show improvements in knee extension ROM for improved function and restore more normal gait    Baseline ext- missing 14 degrees    Time 4    Period Weeks    Status New    Target Date 06/07/20             PT Long Term Goals - 05/10/20 1525      PT LONG TERM GOAL #1   Title Patient will show an improved FOTO score    Baseline 35% functional intake score, 53 predicted    Time 8    Period Weeks    Status New    Target Date 07/05/20      PT LONG TERM GOAL #2   Title Patient will show increased ROM to full extension for improved gait and function    Baseline ext- 14    Time 8    Period Weeks    Status New    Target Date 07/05/20      PT LONG TERM GOAL #3   Title Patient will demonstrate improved strength in LE to 4+/5 or 5/5 for improved function and endurance for ADLs    Time 8    Period Weeks    Status New    Target Date 07/05/20      PT LONG TERM GOAL #4   Title Patient will be able to tolerate standing and walking for up to 3 hours with minimal breaks with pain under 3/10 for ability to return to work    Baseline 10 mins standing    Time 8    Period Weeks    Status New    Target Date 07/05/20      PT LONG TERM GOAL #5   Title Patient will be able to ascend and descend stairs reciprocally without pain increase over 3/10    Baseline one foot at a  time    Time 8    Period Weeks    Status New    Target Date 07/05/20                  Plan - 05/10/20 1542    Clinical Impression Statement Patient presents 60 y/o female s/p Lt TKA 4 weeks ago. Patient is doing well with pain management with medication and icing PRN. Patient skin and incision is healing and closed, no warmth, redness or bruising is present, minimal swelling. Patient seems to be doing well with flexion and has already surpassed 125 degrees with no complaints of pain at her end range. She does have pain and limitations with her extension missing 14 degrees which will guide our focus of early intervention. She is currently experiencing foot drop and  inability to move through full ROM into DF or eversion and feels numbness through lateral leg into toes which we will monitor and address with the stength deficits as it will impact her ability to control her gait pattern and functionability with stairs and returning to work. She has bilateral strength LE deficits that need to be addressed in the coming weeks. 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    Personal Factors and Comorbidities Profession;Age    Examination-Activity Limitations Bathing;Locomotion Level;Squat;Stairs;Stand    Examination-Participation Restrictions Cleaning;Community Activity;Occupation    Stability/Clinical Decision Making Stable/Uncomplicated    Clinical Decision Making Low    Rehab Potential Good    PT Frequency 2x / week    PT Duration 8 weeks    PT Treatment/Interventions ADLs/Self Care Home Management;Aquatic Therapy;Cryotherapy;Electrical Stimulation;DME Instruction;Ultrasound;Therapeutic exercise;Balance training;Neuromuscular re-education;Manual techniques;Orthotic Fit/Training;Therapeutic activities;Functional mobility  training;Stair training;Gait training;Scar mobilization;Passive range of motion;Energy conservation;Joint Manipulations;Vasopneumatic Device;Taping    PT Next Visit Plan Check in with HEP, PROM/ joint mobs for increased ext, check balance    PT Home Exercise Plan Access Code: [email or web address]    Consulted and Agree with Plan of Care Patient           Patient will benefit from skilled therapeutic intervention in order to improve the following deficits and impairments:  Abnormal gait,Decreased range of motion,Difficulty walking  Visit Diagnosis: Acute pain of left knee  Stiffness of left knee, not elsewhere classified  Muscle weakness (generalized)  Other abnormalities of gait and mobility  Localized edema     Problem List Patient Active Problem List   Diagnosis Date Noted  . Status post total left knee replacement 04/12/2020  . Unilateral primary osteoarthritis, left knee 02/02/2019  . S/P arthroscopy of left knee 11/06/2018  . Unilateral primary osteoarthritis, right knee 03/19/2018  . Hyperlipidemia 02/25/2017  . Chronic left-sided low back pain with left-sided sciatica 09/13/2016  . Trigger thumb, left thumb 09/13/2016  . Trigger thumb, right thumb 09/13/2016  . Substernal chest pain 01/03/2015  . PVC's (premature ventricular contractions) 03/23/2014  . PAC (premature atrial contraction) 03/23/2014  . Benign essential HTN 03/23/2014  . Hepatitis C 12/08/2013  . GERD (gastroesophageal reflux disease)     Ritta Slot, SPT 05/10/2020, 3:54 PM   During this treatment session, this physical therapist was present, participating in and directing the treatment.   This note has been reviewed and this clinician agrees with the information provided.    Ivery Quale, PT, DPT 05/10/20 4:26 PM    [facility] Physical Therapy [address] [place], [place], [address] Phone: [phone]   Fax:  [phone]  Name: Teniola Tseng MRN: [id number] Date of Birth:  02-24-1961

## 2020-05-18 ENCOUNTER — Ambulatory Visit (INDEPENDENT_AMBULATORY_CARE_PROVIDER_SITE_OTHER): Payer: PRIVATE HEALTH INSURANCE | Admitting: Rehabilitative and Restorative Service Providers"

## 2020-05-18 ENCOUNTER — Other Ambulatory Visit: Payer: Self-pay

## 2020-05-18 ENCOUNTER — Encounter: Payer: Self-pay | Admitting: Rehabilitative and Restorative Service Providers"

## 2020-05-18 DIAGNOSIS — M6281 Muscle weakness (generalized): Secondary | ICD-10-CM | POA: Diagnosis not present

## 2020-05-18 DIAGNOSIS — M25662 Stiffness of left knee, not elsewhere classified: Secondary | ICD-10-CM | POA: Diagnosis not present

## 2020-05-18 DIAGNOSIS — M25562 Pain in left knee: Secondary | ICD-10-CM

## 2020-05-18 DIAGNOSIS — G8929 Other chronic pain: Secondary | ICD-10-CM

## 2020-05-18 DIAGNOSIS — R262 Difficulty in walking, not elsewhere classified: Secondary | ICD-10-CM

## 2020-05-18 DIAGNOSIS — R600 Localized edema: Secondary | ICD-10-CM

## 2020-05-18 NOTE — Patient Instructions (Signed)
Access Code: [email or web address] URL: [email or web address] Date: 05/18/2020 Prepared by: Pauletta Browns  Exercises Supine Hamstring Stretch with Strap - 2 x daily - 7 x weekly - 1 sets - 4 reps - 30 hold Straight Leg Raise - 2 x daily - 7 x weekly - 3 sets - 10 reps Supine Quad Set - 2 x daily - 7 x weekly - 2 sets - 10 reps - 5 hold Seated Knee Flexion Stretch - 2 x daily - 7 x weekly - 3 sets - 10 reps Heel Prop - 2 x daily - 7 x weekly - 3 sets - 10 reps Seated Ankle Alphabet - 2 x daily - 7 x weekly - 1-2 sets Tandem Stance - 2 x daily - 7 x weekly - 1 sets - 5 reps - 20 second hold Supine Quadricep Sets - 3-5 x daily - 7 x weekly - 2-3 sets - 10 reps - 5 seconds hold Sit to Stand with Armchair - 2 x daily - 7 x weekly - 1 sets - 10 reps

## 2020-05-18 NOTE — Therapy (Addendum)
[facility] Therapy [address] [place], [place], [address] Phone: [phone]   Fax:  [phone]  Physical Therapy Treatment/Discharge addendum PHYSICAL THERAPY DISCHARGE SUMMARY  Visits from Start of Care: 2  Current functional level related to goals / functional outcomes: See below   Remaining deficits: See below   Education / Equipment: HEP Plan: Patient agrees to discharge.  Patient goals were partially met. Patient is being discharged due to the physician's request.  ?????  Elsie Ra, PT, DPT 05/25/20 8:43 AM      Patient Details  Name: Caroline Keller MRN: [id number] Date of Birth: 11-Dec-1960 Referring Provider (PT): Jean Rosenthal, MD   Encounter Date: 05/18/2020   PT End of Session - 05/18/20 1657    Visit Number 2    Number of Visits 16    Date for PT Re-Evaluation 07/05/20    PT Start Time 5997    PT Stop Time 7414    PT Time Calculation (min) 40 min    Activity Tolerance Patient tolerated treatment well;No increased pain    Behavior During Therapy [facility] for tasks assessed/performed           Past Medical History:  Diagnosis Date  . Anxiety   . Arthritis of low back   . GERD (gastroesophageal reflux disease)   . Hepatitis C   . History of C-section   . History of cholecystectomy   . History of hiatal hernia   . Hyperlipidemia   . Hypertension   . PAC (premature atrial contraction)   . Palpitations   . PONV (postoperative nausea and vomiting)   . PVC (premature ventricular contraction)     Past Surgical History:  Procedure Laterality Date  . CARDIOVASCULAR STRESS TEST  01/04/2015  . CESAREAN SECTION    . CHOLECYSTECTOMY    . KNEE SURGERY  10/2018  . TOTAL KNEE ARTHROPLASTY Left 04/12/2020   Procedure: LEFT TOTAL KNEE ARTHROPLASTY;  Surgeon: Mcarthur Rossetti, MD;  Location: [place];  Service: Orthopedics;  Laterality: Left;  RNFA    There were no vitals filed for this visit.   Subjective  Assessment - 05/18/20 1520    Subjective Caroline Keller notes she has had a hard time with her prescription pain medication.  She is managing with aleve.    Limitations Standing;Walking;House hold activities    How long can you stand comfortably? 10 mins    How long can you walk comfortably? 10 mins before fatigue    Patient Stated Goals Get back to work-standing 5 hours, be able to accomplish daily tasks    Currently in Pain? Yes    Pain Score 3     Pain Location Knee    Pain Orientation Left    Pain Descriptors / Indicators Aching;Sore    Pain Type Surgical pain    Pain Onset More than a month ago    Pain Frequency Constant    Aggravating Factors  Numbness    Pain Relieving Factors Aleve    Effect of Pain on Daily Activities Not working    Multiple Pain Sites No              [facility] PT Assessment - 05/18/20 0001      AROM   Right Knee Extension -3    Right Knee Flexion 130                         [facility] Adult PT Treatment/Exercise - 05/18/20 0001  Therapeutic Activites    Therapeutic Activities ADL's    ADL's Step-up and over 4 inch step with slow eccentrics      Neuro Re-ed    Neuro Re-ed Details  Heel to toe balance eyes open/closed/open with head moving side to side 3X each 20 seconds      Exercises   Exercises Knee/Hip      Knee/Hip Exercises: Aerobic   Stationary Bike Seat 6 for 8 minutes      Knee/Hip Exercises: Seated   Sit to Sand 10 reps;without UE support;Other (comment)   slow eccentrics     Knee/Hip Exercises: Supine   Quad Sets Strengthening;Both;1 set;10 reps;Other (comment)    Quad Sets Limitations 5 seconds (dorsiflexion, press knee down and tighten thigh)                  PT Education - 05/18/20 1602    Education Details Reviewed HEP with emphasis on quadriceps strengthening and edema control.    Person(s) Educated Patient    Methods Explanation;Demonstration;Tactile cues;Verbal cues;Handout    Comprehension Verbalized  understanding;Tactile cues required;Returned demonstration;Need further instruction;Verbal cues required            PT Short Term Goals - 05/18/20 1656      PT SHORT TERM GOAL #1   Title Patient will be I with HEP    Time 4    Period Weeks    Status On-going    Target Date 06/07/20      PT SHORT TERM GOAL #2   Title Patient will show improvements in knee extension ROM for improved function and restore more normal gait    Baseline ext- missing 14 degrees    Time 4    Period Weeks    Status Achieved    Target Date 06/07/20             PT Long Term Goals - 05/18/20 1657      PT LONG TERM GOAL #1   Status On-going      PT LONG TERM GOAL #2   Status On-going      PT LONG TERM GOAL #3   Status On-going      PT LONG TERM GOAL #4   Status On-going      PT LONG TERM GOAL #5   Status On-going                 Plan - 05/18/20 1658    Clinical Impression Statement Caroline Keller is already making excellent progress with her AROM.  -3 to 130 degrees today (was -14 to 125).  She is feeling better since stopping her pain meds although numbness has been a problem and I encouraged Caroline Keller to follow-up with Dr. Ninfa Linden with this on her 05/24/20 appointment with him.  Quadriceps strength, proprioception and edema control remain high priorities.    Personal Factors and Comorbidities Profession;Age    Examination-Activity Limitations Bathing;Locomotion Level;Squat;Stairs;Stand    Examination-Participation Restrictions Cleaning;Community Activity;Occupation    Stability/Clinical Decision Making Stable/Uncomplicated    Rehab Potential Good    PT Frequency 2x / week    PT Duration 8 weeks    PT Treatment/Interventions ADLs/Self Care Home Management;Aquatic Therapy;Cryotherapy;Electrical Stimulation;DME Instruction;Ultrasound;Therapeutic exercise;Balance training;Neuromuscular re-education;Manual techniques;Orthotic Fit/Training;Therapeutic activities;Functional mobility training;Stair  training;Gait training;Scar mobilization;Passive range of motion;Energy conservation;Joint Manipulations;Vasopneumatic Device;Taping    PT Next Visit Plan Check in with HEP, PROM/ joint mobs for increased ext, check balance    PT Home Exercise Plan Access Code: [email or web address]    Consulted  and Agree with Plan of Care Patient           Patient will benefit from skilled therapeutic intervention in order to improve the following deficits and impairments:  Abnormal gait,Decreased range of motion,Difficulty walking  Visit Diagnosis: Difficulty walking  Muscle weakness (generalized)  Stiffness of left knee, not elsewhere classified  Localized edema  Chronic pain of left knee     Problem List Patient Active Problem List   Diagnosis Date Noted  . Status post total left knee replacement 04/12/2020  . Unilateral primary osteoarthritis, left knee 02/02/2019  . S/P arthroscopy of left knee 11/06/2018  . Unilateral primary osteoarthritis, right knee 03/19/2018  . Hyperlipidemia 02/25/2017  . Chronic left-sided low back pain with left-sided sciatica 09/13/2016  . Trigger thumb, left thumb 09/13/2016  . Trigger thumb, right thumb 09/13/2016  . Substernal chest pain 01/03/2015  . PVC's (premature ventricular contractions) 03/23/2014  . PAC (premature atrial contraction) 03/23/2014  . Benign essential HTN 03/23/2014  . Hepatitis C 12/08/2013  . GERD (gastroesophageal reflux disease)     Farley Ly PT, MPT 05/18/2020, [facility] [address] [place], [place], [address] Phone: [phone]   Fax:  [phone]  Name: Shinika Estelle MRN: [id number] Date of Birth: 1960/11/08

## 2020-05-20 ENCOUNTER — Encounter: Payer: PRIVATE HEALTH INSURANCE | Admitting: Physical Therapy

## 2020-05-24 ENCOUNTER — Ambulatory Visit (INDEPENDENT_AMBULATORY_CARE_PROVIDER_SITE_OTHER): Payer: PRIVATE HEALTH INSURANCE | Admitting: Orthopaedic Surgery

## 2020-05-24 ENCOUNTER — Encounter: Payer: Self-pay | Admitting: Orthopaedic Surgery

## 2020-05-24 DIAGNOSIS — Z96652 Presence of left artificial knee joint: Secondary | ICD-10-CM

## 2020-05-24 MED ORDER — GABAPENTIN 100 MG PO CAPS: 100.0000 mg | ORAL_CAPSULE | Freq: Three times a day (TID) | ORAL | 1 refills | Status: DC

## 2020-05-24 MED ORDER — METHYLPREDNISOLONE 4 MG PO TABS: ORAL_TABLET | ORAL | 0 refills | Status: DC

## 2020-05-24 NOTE — Progress Notes (Signed)
Ms. Tauzin is now 6 weeks status post a left total knee arthroplasty.  Her postoperative course has been complicated some by foot drop.  She has been experiencing left-sided low back pain but says the knee is doing very well.  She is having some nausea on occasion with the eating.  She is off all narcotics as well.  She is even had occasional diarrhea.  She is walking without an assistive device.  Examination of her left knee shows full extension and full flexion.  The swelling is minimal.  She is very weak with foot dorsiflexion and toe extension and showing evidence still of foot drop.  She is able to show some firing of the nerves to get her foot slightly dorsiflexed.  I am going to start a 6-day steroid taper for her sciatic symptoms as well as Neurontin 100 mg 3 times a day.  She can stop outpatient physical therapy from my standpoint.  I would like to reevaluate her in 4 weeks.  All questions and concerns were answered and addressed.

## 2020-05-25 ENCOUNTER — Encounter: Payer: PRIVATE HEALTH INSURANCE | Admitting: Physical Therapy

## 2020-05-27 ENCOUNTER — Encounter: Payer: PRIVATE HEALTH INSURANCE | Admitting: Rehabilitative and Restorative Service Providers"

## 2020-05-31 ENCOUNTER — Encounter: Payer: PRIVATE HEALTH INSURANCE | Admitting: Rehabilitative and Restorative Service Providers"

## 2020-06-02 ENCOUNTER — Encounter: Payer: PRIVATE HEALTH INSURANCE | Admitting: Physical Therapy

## 2020-06-21 ENCOUNTER — Encounter: Payer: Self-pay | Admitting: Orthopaedic Surgery

## 2020-06-21 ENCOUNTER — Ambulatory Visit (INDEPENDENT_AMBULATORY_CARE_PROVIDER_SITE_OTHER): Payer: PRIVATE HEALTH INSURANCE | Admitting: Orthopaedic Surgery

## 2020-06-21 ENCOUNTER — Other Ambulatory Visit: Payer: Self-pay

## 2020-06-21 DIAGNOSIS — Z96652 Presence of left artificial knee joint: Secondary | ICD-10-CM

## 2020-06-21 DIAGNOSIS — M4807 Spinal stenosis, lumbosacral region: Secondary | ICD-10-CM

## 2020-06-21 MED ORDER — GABAPENTIN 100 MG PO CAPS: 100.0000 mg | ORAL_CAPSULE | Freq: Three times a day (TID) | ORAL | 1 refills | Status: DC

## 2020-06-21 NOTE — Progress Notes (Signed)
The patient is now 2 and half month status post a left total knee replacement.  She did have foot drop postoperatively but that is improving.  She has been dealing with nerve type pain and numbness and tingling in that leg.  She also has a history of left-sided sciatica.  She last had an epidural steroid injection a year ago by Dr. Alvester Morin at the left at L5-S1 and I believe S1-S2.  She is requesting another injection with him because that is been more of the issues with her recently as her low back pain and sciatica the left side.  She says the foot drop is improving and overall she is improving except for getting rest at night.  She is off narcotics but has been taken Neurontin 100 mg 3 times a day.  On examination had 2 and half months her left knee moves great.  She is put herself hard to therapy.  There is minimal swelling.  Her range of motion is entirely full and the knee is ligamentously stable.  She is able to now extend her left foot but it is weak but it is greatly improved from the last visit.  I would have her increase her Neurontin at night to 300 mg but also just taking 100 g in the morning and in the afternoon with again 300 mg at bedtime.  We will see if Dr. Alvester Morin can see her for a left-sided L5-S1 epidural steroid injection.  All question concerns were answered just.  I will see her back myself in 6 weeks.  At that visit I would like an AP and lateral of her left knee.

## 2020-07-03 ENCOUNTER — Other Ambulatory Visit: Payer: Self-pay | Admitting: Cardiology

## 2020-07-04 ENCOUNTER — Other Ambulatory Visit: Payer: Self-pay | Admitting: Orthopaedic Surgery

## 2020-07-04 NOTE — Telephone Encounter (Signed)
Ok to refill 

## 2020-07-12 ENCOUNTER — Other Ambulatory Visit: Payer: Self-pay

## 2020-07-12 ENCOUNTER — Ambulatory Visit: Payer: Self-pay

## 2020-07-12 ENCOUNTER — Ambulatory Visit (INDEPENDENT_AMBULATORY_CARE_PROVIDER_SITE_OTHER): Payer: PRIVATE HEALTH INSURANCE | Admitting: Physical Medicine and Rehabilitation

## 2020-07-12 ENCOUNTER — Encounter: Payer: Self-pay | Admitting: Physical Medicine and Rehabilitation

## 2020-07-12 VITALS — BP 124/78 | HR 82

## 2020-07-12 DIAGNOSIS — M5416 Radiculopathy, lumbar region: Secondary | ICD-10-CM | POA: Diagnosis not present

## 2020-07-12 DIAGNOSIS — M5116 Intervertebral disc disorders with radiculopathy, lumbar region: Secondary | ICD-10-CM

## 2020-07-12 MED ORDER — BETAMETHASONE SOD PHOS & ACET 6 (3-3) MG/ML IJ SUSP
12.0000 mg | Freq: Once | INTRAMUSCULAR | Status: AC
  Administered 2020-07-12: 12 mg

## 2020-07-12 NOTE — Progress Notes (Signed)
Caroline Keller - 60 y.o. female MRN [id number]  Date of birth: [birth year]  Office Visit Note: Visit Date: 07/12/2020 PCP: Jarrett Soho, PA-C Referred by: Jarrett Soho, PA-C  Subjective: Chief Complaint  Patient presents with  . Lower Back - Pain  . Left Leg - Pain   HPI:  Caroline Keller is a 60 y.o. female who comes in today at the request of Dr. Doneen Poisson for planned Left L5-S1 and S1-2 Lumbar epidural steroid injection with fluoroscopic guidance.  The patient has failed conservative care including home exercise, medications, time and activity modification.  This injection will be diagnostic and hopefully therapeutic.  Please see requesting physician notes for further details and justification. MRI reviewed with images and spine model.  MRI reviewed in the note below. S/P left total knee since my last visit with her.  Her course has been complicated by weakness with dorsiflexion on the left.  This did occur sometime after surgery.  She does have a history of the left posterior lateral disc protrusion and narrowing at L5-S1.  She does have dysesthesias and decreased sensation to light touch in a mixture of the left fibular nerve versus L5 dermatome.  Depending on relief with injection Dr. Magnus Ivan may want to consider repeat lumbar spine MRI.  Also consider AFO on the left temporarily for increased mobility and help with her gait.  She reports just about falling on 4 occasions do to dragging her left foot.   ROS Otherwise per HPI.  Assessment & Plan: Visit Diagnoses:    ICD-10-CM   1. Lumbar radiculopathy  M54.16 XR C-ARM NO REPORT    Epidural Steroid injection    betamethasone acetate-betamethasone sodium phosphate (CELESTONE) injection 12 mg  2. Radiculopathy due to lumbar intervertebral disc disorder  M51.16 XR C-ARM NO REPORT    Epidural Steroid injection    betamethasone acetate-betamethasone sodium phosphate (CELESTONE) injection 12 mg    Plan: No additional  findings.   Meds & Orders:  Meds ordered this encounter  Medications  . betamethasone acetate-betamethasone sodium phosphate (CELESTONE) injection 12 mg    Orders Placed This Encounter  Procedures  . XR C-ARM NO REPORT  . Epidural Steroid injection    Follow-up: Return for visit to requesting physician as needed.   Procedures: No procedures performed  Lumbosacral Transforaminal Epidural Steroid Injection - Sub-Pedicular Approach with Fluoroscopic Guidance  Patient: Caroline Keller      Date of Birth: 1960/11/21 MRN: [id number] PCP: Jarrett Soho, PA-C      Visit Date: 07/12/2020   Universal Protocol:    Date/Time: 07/12/2020  Consent Given By: the patient  Position: PRONE  Additional Comments: Vital signs were monitored before and after the procedure. Patient was prepped and draped in the usual sterile fashion. The correct patient, procedure, and site was verified.   Injection Procedure Details:   Procedure diagnoses: Lumbar radiculopathy [M54.16]    Meds Administered:  Meds ordered this encounter  Medications  . betamethasone acetate-betamethasone sodium phosphate (CELESTONE) injection 12 mg    Laterality: Left  Location/Site:  L5-S1 S1-2  Needle:5.0 in., 22 ga.  Short bevel or Quincke spinal needle  Needle Placement: Transforaminal  Findings:    -Comments: Excellent flow of contrast along the nerve, nerve root and into the epidural space.  Procedure Details: After squaring off the end-plates to get a true AP view, the C-arm was positioned so that an oblique view of the foramen as noted above was visualized. The target area is just inferior to the "  nose of the scotty dog" or sub pedicular. The soft tissues overlying this structure were infiltrated with 2-3 ml. of 1% Lidocaine without Epinephrine.  The spinal needle was inserted toward the target using a "trajectory" view along the fluoroscope beam.  Under AP and lateral visualization, the needle was  advanced so it did not puncture dura and was located close the 6 O'Clock position of the pedical in AP tracterory. Biplanar projections were used to confirm position. Aspiration was confirmed to be negative for CSF and/or blood. A 1-2 ml. volume of Isovue-250 was injected and flow of contrast was noted at each level. Radiographs were obtained for documentation purposes.   After attaining the desired flow of contrast documented above, a 0.5 to 1.0 ml test dose of 0.25% Marcaine was injected into each respective transforaminal space.  The patient was observed for 90 seconds post injection.  After no sensory deficits were reported, and normal lower extremity motor function was noted,   the above injectate was administered so that equal amounts of the injectate were placed at each foramen (level) into the transforaminal epidural space.   Additional Comments:  The patient tolerated the procedure well Dressing: 2 x 2 sterile gauze and Band-Aid    Post-procedure details: Patient was observed during the procedure. Post-procedure instructions were reviewed.  Patient left the clinic in stable condition.      Clinical History: Market researcher, Incoming Rad Results - 03/18/2018 1:38 PM EST   FINDINGS:   Bones:  No suspicious osseous lesions. No acute fracture.   ALIGNMENT:  # Trace anterolisthesis of T11 on T12.  # Trace retrolisthesis of L1 on L2.  # Trace retrolisthesis of L2 on L3.  # Trace retrolisthesis of L3 on L4.  # Trace retrolisthesis of L4 and L5.  # Trace retrolisthesis of L5 on S1.  # Dextroconvex curvature at T12-L1.  # Levoconvex curvature at L3-L4.   DISC LEVELS:   T12-L1: No significant central or foraminal stenosis.   L1-L2: Mild degenerative disc disease. Mild disc bulge. Small left foraminal disc protrusion. No significant central or foraminal stenosis.   L2-L3: Mild degenerative disc disease. Uncovered/bulging disc extending into bilateral foramen. Mild  facet degenerative changes. Trace left facet effusion. No significant central or foraminal stenosis.    L3-L4: Mild degenerative disc disease. Mild facet degenerative changes/ligamentum flavum thickening. Small bilateral facet effusions. Uncovered/diffusely bulging disc extending into the foramen. Mild bilateral foraminal stenosis. Mild central stenosis.   L4-L5: Mild degenerative disc disease. Mild diffuse disc bulge. Mild facet degenerative changes/ligamentum flavum thickening. Mild bilateral foraminal stenosis. Mild central stenosis.   L5-S1: Mild degenerative disc disease. Mild diffuse disc bulge and endplate spurring extending into bilateral foramen. Broad left posterolateral disc protrusion. Mild/moderate left foraminal stenosis. No right foraminal stenosis. No central stenosis.   SPINAL CORD:  No abnormal signal is demonstrated within the visualized distal spinal cord. Cauda equina appears unremarkable. Conus medullaris terminates at L1.   PARASPINAL TISSUES:  Intact.   VISUALIZED SACRUM AND LOWER THORACIC SPINE:  No significant abnormality.   INCIDENTAL:  T2 hyperintense focus in the anterior right kidney is incompletely imaged/characterized but may relate to cyst.  Small fat-containing umbilical hernia.    IMPRESSION:  1. Multilevel degenerative changes of the lumbar spine, as detailed above.  2. Foraminal stenosis is most pronounced and mild/moderate on the left at L5-S1 as result of a left posterolateral disc protrusion and endplate spurring.  3. Mild central stenosis at L3-L4 and L4-L5.   Electronically Signed  by: Maurene Capes ---- Lspine MRI L3-L4: 1 mm retrolisthesis. Annular rent with subligamentous protrusion extends to the RIGHT. Facet arthropathy and ligamentum flavum hypertrophy. No definite impingement. BILATERAL facet joint effusions.  L4-L5: Central and leftward protrusion. Asymmetric facet arthropathy and ligamentum flavum hypertrophy, also on the  LEFT. Disc material extends to the foramen. LEFT L5 nerve root impingement likely.  L5-S1: Unilateral LEFT L5 pars defect, with regional bone marrow edema. There is no subluxation. Shallow central and leftward protrusion. Possible LEFT L5 and/or LEFT S1 nerve root impingement.  Compared with most recent priors, 04/28/2013, a LEFT pars is fracture was probably present previously, but there is much more osseous edema, and widening of the defect on today's exam.  IMPRESSION: LEFT L5 pars interarticularis defect, with regional bone marrow edema, new/increased from priors. No subluxation at L5-S1, but a central and leftward protrusion could affect the LEFT L5 and/or LEFT S1 nerve roots.  Central and leftward protrusion at L4-5, with asymmetric posterior element hypertrophy on that side. LEFT L5 nerve root impingement likely.  Lumbar spondylosis at L2-3 and L3-4, without definite impingement.  If further evaluation is desired, consider lumbar myelography with postmyelogram CT, with upright bending films to evaluate for dynamic instability, and the relative contributions of L4-5 and L5-S1 to the patient's LEFT leg symptomatology.   Electronically Signed   By: Elsie Stain M.D.   On: 09/09/2016 12:55     Objective:  VS:  HT:    WT:   BMI:     BP:124/78  HR:82bpm  TEMP: ( )  RESP:  Physical Exam Vitals and nursing note reviewed.  Constitutional:      General: She is not in acute distress.    Appearance: Normal appearance. She is not ill-appearing.  HENT:     Head: Normocephalic and atraumatic.     Right Ear: External ear normal.     Left Ear: External ear normal.  Eyes:     Extraocular Movements: Extraocular movements intact.  Cardiovascular:     Rate and Rhythm: Normal rate.     Pulses: Normal pulses.  Pulmonary:     Effort: Pulmonary effort is normal. No respiratory distress.  Abdominal:     General: There is no distension.     Palpations: Abdomen is  soft.  Musculoskeletal:        General: Tenderness present.     Cervical back: Neck supple.     Right lower leg: No edema.     Left lower leg: No edema.     Comments: Patient ambulates with some weakness on the left measured weakness is 3+ or 4 out of 5 in dorsiflexion but good with plantar flexion.  She has some weakness with EHL.  She has decreased sensation to light touch on the left more of a fibular nerve distribution versus L5 distribution.  Skin:    Findings: No erythema, lesion or rash.  Neurological:     General: No focal deficit present.     Mental Status: She is alert and oriented to person, place, and time.     Sensory: No sensory deficit.     Motor: No weakness or abnormal muscle tone.     Coordination: Coordination normal.  Psychiatric:        Mood and Affect: Mood normal.        Behavior: Behavior normal.      Imaging: No results found.

## 2020-07-12 NOTE — Patient Instructions (Signed)

## 2020-07-12 NOTE — Progress Notes (Signed)
Pt state lower back pain that travels down her left leg. Pt state walking and bending makes the pain worse. Pt state she uses heating pads and pain meds to help ease her pain.   Numeric Pain Rating Scale and Functional Assessment Average Pain 7   In the last MONTH (on 0-10 scale) has pain interfered with the following?  1. General activity like being  able to carry out your everyday physical activities such as walking, climbing stairs, carrying groceries, or moving a chair?  Rating(10)   +Driver, -BT, -Dye Allergies.

## 2020-07-12 NOTE — Procedures (Signed)
Lumbosacral Transforaminal Epidural Steroid Injection - Sub-Pedicular Approach with Fluoroscopic Guidance  Patient: Caroline Keller      Date of Birth: November 19, 1960 MRN: [id number] PCP: Jarrett Soho, PA-C      Visit Date: 07/12/2020   Universal Protocol:    Date/Time: 07/12/2020  Consent Given By: the patient  Position: PRONE  Additional Comments: Vital signs were monitored before and after the procedure. Patient was prepped and draped in the usual sterile fashion. The correct patient, procedure, and site was verified.   Injection Procedure Details:   Procedure diagnoses: Lumbar radiculopathy [M54.16]    Meds Administered:  Meds ordered this encounter  Medications  . betamethasone acetate-betamethasone sodium phosphate (CELESTONE) injection 12 mg    Laterality: Left  Location/Site:  L5-S1 S1-2  Needle:5.0 in., 22 ga.  Short bevel or Quincke spinal needle  Needle Placement: Transforaminal  Findings:    -Comments: Excellent flow of contrast along the nerve, nerve root and into the epidural space.  Procedure Details: After squaring off the end-plates to get a true AP view, the C-arm was positioned so that an oblique view of the foramen as noted above was visualized. The target area is just inferior to the "nose of the scotty dog" or sub pedicular. The soft tissues overlying this structure were infiltrated with 2-3 ml. of 1% Lidocaine without Epinephrine.  The spinal needle was inserted toward the target using a "trajectory" view along the fluoroscope beam.  Under AP and lateral visualization, the needle was advanced so it did not puncture dura and was located close the 6 O'Clock position of the pedical in AP tracterory. Biplanar projections were used to confirm position. Aspiration was confirmed to be negative for CSF and/or blood. A 1-2 ml. volume of Isovue-250 was injected and flow of contrast was noted at each level. Radiographs were obtained for documentation purposes.    After attaining the desired flow of contrast documented above, a 0.5 to 1.0 ml test dose of 0.25% Marcaine was injected into each respective transforaminal space.  The patient was observed for 90 seconds post injection.  After no sensory deficits were reported, and normal lower extremity motor function was noted,   the above injectate was administered so that equal amounts of the injectate were placed at each foramen (level) into the transforaminal epidural space.   Additional Comments:  The patient tolerated the procedure well Dressing: 2 x 2 sterile gauze and Band-Aid    Post-procedure details: Patient was observed during the procedure. Post-procedure instructions were reviewed.  Patient left the clinic in stable condition.

## 2020-07-18 ENCOUNTER — Encounter: Payer: Self-pay | Admitting: Orthopaedic Surgery

## 2020-07-20 ENCOUNTER — Ambulatory Visit: Payer: PRIVATE HEALTH INSURANCE | Admitting: Orthopaedic Surgery

## 2020-07-27 ENCOUNTER — Ambulatory Visit (INDEPENDENT_AMBULATORY_CARE_PROVIDER_SITE_OTHER): Payer: PRIVATE HEALTH INSURANCE | Admitting: Orthopaedic Surgery

## 2020-07-27 ENCOUNTER — Ambulatory Visit (INDEPENDENT_AMBULATORY_CARE_PROVIDER_SITE_OTHER): Payer: PRIVATE HEALTH INSURANCE

## 2020-07-27 ENCOUNTER — Encounter: Payer: Self-pay | Admitting: Orthopaedic Surgery

## 2020-07-27 DIAGNOSIS — M25572 Pain in left ankle and joints of left foot: Secondary | ICD-10-CM

## 2020-07-27 NOTE — Progress Notes (Signed)
Office Visit Note   Patient: Caroline Keller           Date of Birth: 1960/07/15           MRN: [id number] Visit Date: 07/27/2020              Requested by: Jarrett Soho, PA-C [address] [place],  [place] [address] PCP: Jarrett Soho, PA-C   Assessment & Plan: Visit Diagnoses:  1. Pain in left ankle and joints of left foot     Plan: We went over her x-rays of her left ankle and it showed her this is a stable fracture pattern.  She can weight-bear as tolerated in the cam walking boot that she is in and can transition to an ASO over the next 1 to 2 weeks as comfort allows.  I would like to see her back in 4 weeks with a repeat x-rays of her left ankle which should be just an AP and mortise 2 views.  All questions and concerns were answered and addressed.  Follow-Up Instructions: Return in about 4 weeks (around 08/24/2020).   Orders:  Orders Placed This Encounter  Procedures  . XR Ankle Complete Left   No orders of the defined types were placed in this encounter.     Procedures: No procedures performed   Clinical Data: No additional findings.   Subjective: Chief Complaint  Patient presents with  . Left Ankle - Pain, Injury  The patient is accident in follow-up status post a left total knee arthroplasty and has done very well from a left knee replacement and he made great progress.  She has had a steroid injection in her lumbar spine which really help with the leg on that side as well due to sciatic symptoms.  However a week and a half ago she tripped and fell injuring her left ankle.  She has had several times where she is rolled that ankle but this was more severe.  X-rays were obtained in urgent care center and she was placed in a cam walking boot.  She has been weightbearing as tolerated.  She is reporting global ankle swelling and lateral pain as well as significant bruising.  She said the knee replacement is doing well.  HPI  Review of Systems She currently  denies a headache, chest pain, shortness of breath, fever, chills, nausea, vomiting  Objective: Vital Signs: There were no vitals taken for this visit.  Physical Exam She is alert and orient x3 and in no acute distress Ortho Exam Examination her left ankle shows pain over the lateral malleolus.  The ankle is stable on exam but certainly painful with global swelling and bruising especially on the medial aspect of the ankle.  Her foot is well-perfused. Specialty Comments:  No specialty comments available.  Imaging: XR Ankle Complete Left  Result Date: 07/27/2020 3 views left ankle show a nondisplaced lateral malleolus fracture that is distal to the ankle mortise.  The ankle mortise is congruent and intact.  This is a Weber A fracture pattern that is stable.    PMFS History: Patient Active Problem List   Diagnosis Date Noted  . Status post total left knee replacement 04/12/2020  . Unilateral primary osteoarthritis, left knee 02/02/2019  . S/P arthroscopy of left knee 11/06/2018  . Unilateral primary osteoarthritis, right knee 03/19/2018  . Hyperlipidemia 02/25/2017  . Chronic left-sided low back pain with left-sided sciatica 09/13/2016  . Trigger thumb, left thumb 09/13/2016  . Trigger thumb, right thumb  09/13/2016  . Substernal chest pain 01/03/2015  . PVC's (premature ventricular contractions) 03/23/2014  . PAC (premature atrial contraction) 03/23/2014  . Benign essential HTN 03/23/2014  . Hepatitis C 12/08/2013  . GERD (gastroesophageal reflux disease)    Past Medical History:  Diagnosis Date  . Anxiety   . Arthritis of low back   . GERD (gastroesophageal reflux disease)   . Hepatitis C   . History of C-section   . History of cholecystectomy   . History of hiatal hernia   . Hyperlipidemia   . Hypertension   . PAC (premature atrial contraction)   . Palpitations   . PONV (postoperative nausea and vomiting)   . PVC (premature ventricular contraction)     Family  History  Problem Relation Age of Onset  . Lung cancer Mother   . Coronary artery disease Father   . Breast cancer Sister   . Colon cancer Neg Hx   . Rectal cancer Neg Hx   . Stomach cancer Neg Hx   . Pancreatic cancer Neg Hx   . Esophageal cancer Neg Hx     Past Surgical History:  Procedure Laterality Date  . CARDIOVASCULAR STRESS TEST  01/04/2015  . CESAREAN SECTION    . CHOLECYSTECTOMY    . KNEE SURGERY  10/2018  . TOTAL KNEE ARTHROPLASTY Left 04/12/2020   Procedure: LEFT TOTAL KNEE ARTHROPLASTY;  Surgeon: Kathryne Hitch, MD;  Location: [facility] OR;  Service: Orthopedics;  Laterality: Left;  RNFA   Social History   Occupational History  . Not on file  Tobacco Use  . Smoking status: Former Smoker    Quit date: 04/16/1981    Years since quitting: 39.3  . Smokeless tobacco: Never Used  Vaping Use  . Vaping Use: Never used  Substance and Sexual Activity  . Alcohol use: No  . Drug use: No  . Sexual activity: Not on file

## 2020-08-02 ENCOUNTER — Ambulatory Visit: Payer: PRIVATE HEALTH INSURANCE | Admitting: Orthopaedic Surgery

## 2020-08-10 ENCOUNTER — Other Ambulatory Visit: Payer: Self-pay | Admitting: Cardiology

## 2020-08-11 ENCOUNTER — Other Ambulatory Visit: Payer: Self-pay

## 2020-08-11 ENCOUNTER — Encounter: Payer: Self-pay | Admitting: Cardiology

## 2020-08-11 ENCOUNTER — Ambulatory Visit (INDEPENDENT_AMBULATORY_CARE_PROVIDER_SITE_OTHER): Payer: PRIVATE HEALTH INSURANCE | Admitting: Cardiology

## 2020-08-11 VITALS — BP 132/78 | HR 82 | Ht 64.0 in | Wt 203.6 lb

## 2020-08-11 DIAGNOSIS — I493 Ventricular premature depolarization: Secondary | ICD-10-CM

## 2020-08-11 DIAGNOSIS — E7800 Pure hypercholesterolemia, unspecified: Secondary | ICD-10-CM

## 2020-08-11 DIAGNOSIS — I10 Essential (primary) hypertension: Secondary | ICD-10-CM

## 2020-08-11 NOTE — Patient Instructions (Signed)
Medication Instructions:  Your physician recommends that you continue on your current medications as directed. Please refer to the Current Medication list given to you today.  *If you need a refill on your cardiac medications before your next appointment, please call your pharmacy*   Lab Work: TODAY: CMET and Fasting Lipids If you have labs (blood work) drawn today and your tests are completely normal, you will receive your results only by: Marland Kitchen MyChart Message (if you have MyChart) OR . A paper copy in the mail If you have any lab test that is abnormal or we need to change your treatment, we will call you to review the results.  Follow-Up: At [facility], you and your health needs are our priority.  As part of our continuing mission to provide you with exceptional heart care, we have created designated Provider Care Teams.  These Care Teams include your primary Cardiologist (physician) and Advanced Practice Providers (APPs -  Physician Assistants and Nurse Practitioners) who all work together to provide you with the care you need, when you need it.  Your next appointment:   1 year(s)  The format for your next appointment:   In Person  Provider:   You may see Armanda Magic, MD or one of the following Advanced Practice Providers on your designated Care Team:    Ronie Spies, PA-C  Jacolyn Reedy, PA-C

## 2020-08-11 NOTE — Progress Notes (Signed)
Cardiology Office Note:    Date:  08/11/2020   ID:  Caroline Keller, DOB [birth year], MRN [id number]  PCP:  Jarrett Soho, PA-C  Cardiologist:  Armanda Magic, MD    Referring MD: Jarrett Soho, PA-C   Chief Complaint  Patient presents with  . Hypertension  . Hyperlipidemia    History of Present Illness:    Caroline Keller is a 60 y.o. female with a hx of HTN, PVC/PACs, HLD, treated hepatitis C, hiatal hernia . She has history of palpitations with stress/anxiety which have been treated with diltiazem. Nuc in 2016 was normal, EF 59%. She saw Arelia Longest NP 02/2018 for routine follow-up and it was noted that she had both high BP and high cholesterol. Micardis was increased to 80/12.5mg  daily and she was started on atorastatin 20mg .     She is here today for followup and is doing well.  She denies any chest pain or pressure, SOB, DOE, PND, orthopnea, LE edema, dizziness or syncope. Occasionally she will have a skipped heart beat but has learned to ignore them. She is compliant with her meds and is tolerating meds with no SE.    Past Medical History:  Diagnosis Date  . Anxiety   . Arthritis of low back   . GERD (gastroesophageal reflux disease)   . Hepatitis C   . History of C-section   . History of cholecystectomy   . History of hiatal hernia   . Hyperlipidemia   . Hypertension   . PAC (premature atrial contraction)   . Palpitations   . PONV (postoperative nausea and vomiting)   . PVC (premature ventricular contraction)     Past Surgical History:  Procedure Laterality Date  . CARDIOVASCULAR STRESS TEST  01/04/2015  . CESAREAN SECTION    . CHOLECYSTECTOMY    . KNEE SURGERY  10/2018  . TOTAL KNEE ARTHROPLASTY Left 04/12/2020   Procedure: LEFT TOTAL KNEE ARTHROPLASTY;  Surgeon: 04/14/2020, MD;  Location: [facility] OR;  Service: Orthopedics;  Laterality: Left;  RNFA    Current Medications: Current Meds  Medication Sig  . atorvastatin (LIPITOR) 10 MG tablet Take 1 tablet  (10 mg total) by mouth daily. Please keep upcoming appt in April 2022 with Dr. May 2022 before anymore refills. Thank you  . calcium carbonate (OSCAL) 1500 (600 Ca) MG TABS tablet Take 600 mg of elemental calcium by mouth 2 (two) times daily.  . clonazePAM (KLONOPIN) 1 MG tablet Take 1 tablet by mouth at bedtime as needed (sleep/anxiety).  Mayford Knife diltiazem (CARDIZEM CD) 360 MG 24 hr capsule Take 1 capsule (360 mg total) by mouth daily.  Marland Kitchen escitalopram (LEXAPRO) 20 MG tablet Take 20 mg by mouth daily.  Marland Kitchen gabapentin (NEURONTIN) 100 MG capsule Take 1 capsule (100 mg total) by mouth 3 (three) times daily.  Marland Kitchen ibuprofen (ADVIL) 200 MG tablet Take 400 mg by mouth every 6 (six) hours as needed for headache or moderate pain.  . pantoprazole (PROTONIX) 20 MG tablet Take 20 mg by mouth daily.  . pantoprazole (PROTONIX) 40 MG tablet Take 1 tablet by mouth daily.  Marland Kitchen telmisartan-hydrochlorothiazide (MICARDIS HCT) 80-25 MG tablet Take 1 tablet by mouth daily. Please keep upcoming appt in April 2022 with Dr. May 2022 before anymore refills. Thank you     Allergies:   Patient has no known allergies.   Social History   Socioeconomic History  . Marital status: Married    Spouse name: Not on file  . Number of children: Not on file  .  Years of education: Not on file  . Highest education level: Not on file  Occupational History  . Not on file  Tobacco Use  . Smoking status: Former Smoker    Quit date: 04/16/1981    Years since quitting: 39.3  . Smokeless tobacco: Never Used  Vaping Use  . Vaping Use: Never used  Substance and Sexual Activity  . Alcohol use: No  . Drug use: No  . Sexual activity: Not on file  Other Topics Concern  . Not on file  Social History Narrative  . Not on file   Social Determinants of Health   Financial Resource Strain: Not on file  Food Insecurity: Not on file  Transportation Needs: Not on file  Physical Activity: Not on file  Stress: Not on file  Social Connections: Not on  file     Family History: The patient's family history includes Breast cancer in her sister; Coronary artery disease in her father; Lung cancer in her mother. There is no history of Colon cancer, Rectal cancer, Stomach cancer, Pancreatic cancer, or Esophageal cancer.  ROS:   Please see the history of present illness.    ROS  All other systems reviewed and negative.   EKGs/Labs/Other Studies Reviewed:    The following studies were reviewed today:  EKG  EKG:  EKG is  ordered today.  The ekg ordered today demonstrates NSR with no ST changes  Recent Labs: 04/13/2020: BUN 34; Creatinine, Ser 1.64; Hemoglobin 10.8; Platelets 228; Potassium 5.4; Sodium 133   Recent Lipid Panel    Component Value Date/Time   CHOL 179 06/17/2019 1128   TRIG 59 06/17/2019 1128   HDL 83 06/17/2019 1128   CHOLHDL 2.2 06/17/2019 1128   CHOLHDL 3.3 01/04/2015 0211   VLDL 7 01/04/2015 0211   LDLCALC 85 06/17/2019 1128    Physical Exam:    VS:  BP 132/78   Pulse 82   Ht 5\' 4"  (1.626 m)   Wt 203 lb 9.6 oz (92.4 kg)   SpO2 97%   BMI 34.95 kg/m     Wt Readings from Last 3 Encounters:  08/11/20 203 lb 9.6 oz (92.4 kg)  04/12/20 208 lb 8.9 oz (94.6 kg)  04/06/20 208 lb 8 oz (94.6 kg)    GEN: Well nourished, well developed in no acute distress HEENT: Normal NECK: No JVD; No carotid bruits LYMPHATICS: No lymphadenopathy CARDIAC:RRR, no murmurs, rubs, gallops RESPIRATORY:  Clear to auscultation without rales, wheezing or rhonchi  ABDOMEN: Soft, non-tender, non-distended MUSCULOSKELETAL:  No edema; No deformity  SKIN: Warm and dry NEUROLOGIC:  Alert and oriented x 3 PSYCHIATRIC:  Normal affect    ASSESSMENT:    1. Benign essential HTN   2. Pure hypercholesterolemia   3. PVC's (premature ventricular contractions)    PLAN:    In order of problems listed above:  1.  HTN -BP is adequately controlled on exam today -continue Diltiazem CD 360mg  daily, Micardis HCT 80-25mg  daily -SCr 1.64 and  K+ 5.4 in Dec 2021 at time of TKR -repeat BMET today to make sure SCr has normalized  2.  HLD -LDL goal < 100 -LDL was 85 in march -repeat FLP and ALT -continue atorvastatin 10mg  daily  3.  PVCs and PACs -she occasionally has some flip flop of her heart but she has learned to ignore -continue Diltiazem CD 360mg  daily   Medication Adjustments/Labs and Tests Ordered: Current medicines are reviewed at length with the patient today.  Concerns regarding medicines are  outlined above.  Orders Placed This Encounter  Procedures  . EKG 12-Lead   No orders of the defined types were placed in this encounter.   Signed, Armanda Magic, MD  08/11/2020 1:22 PM    [facility]

## 2020-08-11 NOTE — Addendum Note (Signed)
Addended by: Theresia Majors on: 08/11/2020 01:27 PM   Modules accepted: Orders

## 2020-08-12 LAB — COMPREHENSIVE METABOLIC PANEL
ALT: 10 IU/L (ref 0–32)
AST: 19 IU/L (ref 0–40)
Albumin/Globulin Ratio: 1.7 (ref 1.2–2.2)
Albumin: 4.7 g/dL (ref 3.8–4.8)
Alkaline Phosphatase: 122 IU/L — ABNORMAL HIGH (ref 44–121)
BUN/Creatinine Ratio: 26 (ref 12–28)
BUN: 29 mg/dL — ABNORMAL HIGH (ref 8–27)
Bilirubin Total: 0.4 mg/dL (ref 0.0–1.2)
CO2: 20 mmol/L (ref 20–29)
Calcium: 9.8 mg/dL (ref 8.7–10.3)
Chloride: 98 mmol/L (ref 96–106)
Creatinine, Ser: 1.11 mg/dL — ABNORMAL HIGH (ref 0.57–1.00)
Globulin, Total: 2.7 g/dL (ref 1.5–4.5)
Glucose: 90 mg/dL (ref 65–99)
Potassium: 4.7 mmol/L (ref 3.5–5.2)
Sodium: 135 mmol/L (ref 134–144)
Total Protein: 7.4 g/dL (ref 6.0–8.5)
eGFR: 57 mL/min/{1.73_m2} — ABNORMAL LOW (ref >59)

## 2020-08-12 LAB — LIPID PANEL
Chol/HDL Ratio: 2.1 ratio (ref 0.0–4.4)
Cholesterol, Total: 193 mg/dL (ref 100–199)
HDL: 93 mg/dL (ref >39)
LDL Chol Calc (NIH): 88 mg/dL (ref 0–99)
Triglycerides: 64 mg/dL (ref 0–149)
VLDL Cholesterol Cal: 12 mg/dL (ref 5–40)

## 2020-08-24 ENCOUNTER — Ambulatory Visit (INDEPENDENT_AMBULATORY_CARE_PROVIDER_SITE_OTHER): Payer: PRIVATE HEALTH INSURANCE

## 2020-08-24 ENCOUNTER — Ambulatory Visit (INDEPENDENT_AMBULATORY_CARE_PROVIDER_SITE_OTHER): Payer: PRIVATE HEALTH INSURANCE | Admitting: Orthopaedic Surgery

## 2020-08-24 ENCOUNTER — Encounter: Payer: Self-pay | Admitting: Orthopaedic Surgery

## 2020-08-24 DIAGNOSIS — M25572 Pain in left ankle and joints of left foot: Secondary | ICD-10-CM

## 2020-08-24 DIAGNOSIS — S8265XD Nondisplaced fracture of lateral malleolus of left fibula, subsequent encounter for closed fracture with routine healing: Secondary | ICD-10-CM

## 2020-08-24 NOTE — Progress Notes (Signed)
The patient is returning for follow-up having sustained a nondisplaced left ankle lateral malleolus fracture.  She is walking now without any ASO or boot.  She does not need crutches.  She is also 5 months out from a left total knee arthroplasty.  About 5 weeks ago she did have an epidural steroid injection to the left side which helped her quite a bit until she broke her ankle which has now thrown her off some.  She is walking without a limp.  There is minimal swelling of her left ankle but pain over the tip of the fibula to be expected.  X-rays of the left ankle show that the fracture remains nondisplaced at the lateral malleolus which is a Weber a fracture.  It is in good alignment.  We will see her back in 4 weeks.  At that visit I like an AP and lateral of her left operative total knee replacement and 3 views of the left ankle.  I did give her a note to allow her to return to work next week.

## 2020-09-21 ENCOUNTER — Encounter: Payer: Self-pay | Admitting: Orthopaedic Surgery

## 2020-09-21 ENCOUNTER — Ambulatory Visit (INDEPENDENT_AMBULATORY_CARE_PROVIDER_SITE_OTHER): Payer: PRIVATE HEALTH INSURANCE | Admitting: Orthopaedic Surgery

## 2020-09-21 ENCOUNTER — Ambulatory Visit (INDEPENDENT_AMBULATORY_CARE_PROVIDER_SITE_OTHER): Payer: PRIVATE HEALTH INSURANCE

## 2020-09-21 ENCOUNTER — Other Ambulatory Visit: Payer: Self-pay

## 2020-09-21 DIAGNOSIS — Z96652 Presence of left artificial knee joint: Secondary | ICD-10-CM | POA: Diagnosis not present

## 2020-09-21 DIAGNOSIS — M25572 Pain in left ankle and joints of left foot: Secondary | ICD-10-CM

## 2020-09-21 DIAGNOSIS — G8929 Other chronic pain: Secondary | ICD-10-CM

## 2020-09-21 DIAGNOSIS — M25561 Pain in right knee: Secondary | ICD-10-CM | POA: Diagnosis not present

## 2020-09-21 DIAGNOSIS — M4807 Spinal stenosis, lumbosacral region: Secondary | ICD-10-CM

## 2020-09-21 MED ORDER — METHYLPREDNISOLONE ACETATE 40 MG/ML IJ SUSP
40.0000 mg | INTRAMUSCULAR | Status: AC | PRN
  Administered 2020-09-21: 11:00:00 40 mg via INTRA_ARTICULAR

## 2020-09-21 MED ORDER — LIDOCAINE HCL 1 % IJ SOLN
3.0000 mL | INTRAMUSCULAR | Status: AC | PRN
Start: 2020-09-21 — End: 2020-09-21
  Administered 2020-09-21: 11:00:00 3 mL

## 2020-09-21 NOTE — Progress Notes (Signed)
Office Visit Note   Patient: Caroline Keller           Date of Birth: November 18, 1960           MRN: [id number] Visit Date: 09/21/2020              Requested by: Jarrett Soho, PA-C [address] [place],  [place] [address] PCP: Jarrett Soho, PA-C   Assessment & Plan: Visit Diagnoses:  1. Pain in left ankle and joints of left foot   2. History of left knee replacement   3. Chronic pain of right knee     Plan: I did place a steroid injection in her right knee which she requested and she tolerated it well.  From my standpoint, I do not need to see her back for 6 months.  I would like a standing AP and lateral of both knees at that visit.  We will see about getting her an appointment with Dr. Alvester Morin for repeat left-sided ESI at L5-S1/S1-S2.  All questions and concerns were answered and addressed.  Follow-Up Instructions: Return in about 6 months (around 03/23/2021).   Orders:  Orders Placed This Encounter  Procedures  . Large Joint Inj  . XR Knee 1-2 Views Left  . XR Ankle Complete Left   No orders of the defined types were placed in this encounter.     Procedures: Large Joint Inj: R knee on 09/21/2020 10:53 AM Indications: diagnostic evaluation and pain Details: 22 G 1.5 in needle, superolateral approach  Arthrogram: No  Medications: 3 mL lidocaine 1 %; 40 mg methylPREDNISolone acetate 40 MG/ML Outcome: tolerated well, no immediate complications Procedure, treatment alternatives, risks and benefits explained, specific risks discussed. Consent was given by the patient. Immediately prior to procedure a time out was called to verify the correct patient, procedure, equipment, support staff and site/side marked as required. Patient was prepped and draped in the usual sterile fashion.       Clinical Data: No additional findings.   Subjective: Chief Complaint  Patient presents with  . Left Knee - Follow-up  . Left Ankle - Follow-up  The patient is a very pleasant  60 year old female well-known to me.  She is 6 months out from a left total knee arthroplasty.  She is actually 8 weeks out from a nondisplaced lateral malleolus fracture of the left ankle which was a stable fracture.  She reports minimal pain with her knee and her ankle.  She last had a left-sided L5-S1 and S1-S2 injection by Dr. Alvester Morin back in March.  She said that did excellent until she broke her ankle and hobbling around with a knee replacement and her cam walking boot has caused a flareup of her low back pain in that area.  She is requesting another injection by Dr. Alvester Morin which I think is reasonable since it will be almost 3 months since his last injection there and she is now diabetic and doing well.  She does report right knee pain and has known arthritis in the right knee.  She is requesting an injection of a steroid in the right knee today.  HPI  Review of Systems There is currently listed no headache, chest pain, shortness of breath, fever, chills, nausea, vomiting  Objective: Vital Signs: There were no vitals taken for this visit.  Physical Exam She is alert and orient x3 and in no acute distress Ortho Exam Examination of her left operative knee shows that it is ligamentously stable with excellent range of  motion.  Her left ankle moves very well and has essentially almost no pain over the lateral malleolus. Specialty Comments:  No specialty comments available.  Imaging: XR Ankle Complete Left  Result Date: 09/21/2020 3 views left ankle show a healing nondisplaced lateral malleolus fracture that is at the distal end of the fibula.  The ankle mortise is congruent.  XR Knee 1-2 Views Left  Result Date: 09/21/2020 2 views of the left knee show well-seated total knee arthroplasty with no complicating features.    PMFS History: Patient Active Problem List   Diagnosis Date Noted  . Status post total left knee replacement 04/12/2020  . Unilateral primary osteoarthritis, left knee  02/02/2019  . S/P arthroscopy of left knee 11/06/2018  . Unilateral primary osteoarthritis, right knee 03/19/2018  . Hyperlipidemia 02/25/2017  . Chronic left-sided low back pain with left-sided sciatica 09/13/2016  . Trigger thumb, left thumb 09/13/2016  . Trigger thumb, right thumb 09/13/2016  . Substernal chest pain 01/03/2015  . PVC's (premature ventricular contractions) 03/23/2014  . PAC (premature atrial contraction) 03/23/2014  . Benign essential HTN 03/23/2014  . Hepatitis C 12/08/2013  . GERD (gastroesophageal reflux disease)    Past Medical History:  Diagnosis Date  . Anxiety   . Arthritis of low back   . GERD (gastroesophageal reflux disease)   . Hepatitis C   . History of C-section   . History of cholecystectomy   . History of hiatal hernia   . Hyperlipidemia   . Hypertension   . PAC (premature atrial contraction)   . Palpitations   . PONV (postoperative nausea and vomiting)   . PVC (premature ventricular contraction)     Family History  Problem Relation Age of Onset  . Lung cancer Mother   . Coronary artery disease Father   . Breast cancer Sister   . Colon cancer Neg Hx   . Rectal cancer Neg Hx   . Stomach cancer Neg Hx   . Pancreatic cancer Neg Hx   . Esophageal cancer Neg Hx     Past Surgical History:  Procedure Laterality Date  . CARDIOVASCULAR STRESS TEST  01/04/2015  . CESAREAN SECTION    . CHOLECYSTECTOMY    . KNEE SURGERY  10/2018  . TOTAL KNEE ARTHROPLASTY Left 04/12/2020   Procedure: LEFT TOTAL KNEE ARTHROPLASTY;  Surgeon: Kathryne Hitch, MD;  Location: [facility] OR;  Service: Orthopedics;  Laterality: Left;  RNFA   Social History   Occupational History  . Not on file  Tobacco Use  . Smoking status: Former Smoker    Quit date: 04/16/1981    Years since quitting: 39.4  . Smokeless tobacco: Never Used  Vaping Use  . Vaping Use: Never used  Substance and Sexual Activity  . Alcohol use: No  . Drug use: No  . Sexual activity: Not on  file

## 2020-09-29 ENCOUNTER — Telehealth: Payer: Self-pay | Admitting: Physical Medicine and Rehabilitation

## 2020-09-29 NOTE — Telephone Encounter (Signed)
Pt called stating she would like to get an appt with Dr. Alvester Morin.  [phone]

## 2020-09-30 ENCOUNTER — Telehealth: Payer: Self-pay | Admitting: Physical Medicine and Rehabilitation

## 2020-09-30 NOTE — Telephone Encounter (Signed)
Pt called stating she was returning a phone call for [facility]. Pt did not state what the call was in regards to or leave a message other than to call her back when possible. The best call back number [phone].

## 2020-10-05 ENCOUNTER — Other Ambulatory Visit: Payer: Self-pay | Admitting: Cardiology

## 2020-10-24 ENCOUNTER — Encounter: Payer: Self-pay | Admitting: Physical Medicine and Rehabilitation

## 2020-10-24 ENCOUNTER — Ambulatory Visit: Payer: Self-pay

## 2020-10-24 ENCOUNTER — Other Ambulatory Visit: Payer: Self-pay

## 2020-10-24 ENCOUNTER — Ambulatory Visit (INDEPENDENT_AMBULATORY_CARE_PROVIDER_SITE_OTHER): Payer: PRIVATE HEALTH INSURANCE | Admitting: Physical Medicine and Rehabilitation

## 2020-10-24 VITALS — BP 128/81 | HR 87

## 2020-10-24 DIAGNOSIS — M5116 Intervertebral disc disorders with radiculopathy, lumbar region: Secondary | ICD-10-CM | POA: Diagnosis not present

## 2020-10-24 DIAGNOSIS — M5416 Radiculopathy, lumbar region: Secondary | ICD-10-CM | POA: Diagnosis not present

## 2020-10-24 MED ORDER — METHYLPREDNISOLONE ACETATE 80 MG/ML IJ SUSP
80.0000 mg | Freq: Once | INTRAMUSCULAR | Status: AC
  Administered 2020-10-24: 80 mg

## 2020-10-24 NOTE — Patient Instructions (Signed)

## 2020-10-24 NOTE — Progress Notes (Signed)
Pt state Lower back pain that travels down left leg. Pt state sitting makes the pain worse.  Pt has hx of inj on 07/12/20 pt state It helped.  Numeric Pain Rating Scale and Functional Assessment Average Pain 6   In the last MONTH (on 0-10 scale) has pain interfered with the following?  1. General activity like being  able to carry out your everyday physical activities such as walking, climbing stairs, carrying groceries, or moving a chair?  Rating(10)   +Driver, -BT, -Dye Allergies.

## 2020-10-25 NOTE — Progress Notes (Signed)
Caroline Keller - 60 y.o. female MRN [id number]  Date of birth: 30-Jun-1960  Office Visit Note: Visit Date: 10/24/2020 PCP: Jarrett Soho, PA-C Referred by: Jarrett Soho, PA-C  Subjective: Chief Complaint  Patient presents with   Lower Back - Pain   Left Leg - Pain   HPI:  Caroline Keller is a 60 y.o. female who comes in today for planned repeat Left L5-S1 and S1-2  Lumbar Transforaminal epidural steroid injection with fluoroscopic guidance.  The patient has failed conservative care including home exercise, medications, time and activity modification.  This injection will be diagnostic and hopefully therapeutic.  Please see requesting physician notes for further details and justification. Patient received more than 50% pain relief from prior injection. MRI reviewed with images and spine model.  MRI reviewed in the note below.  Once again she is having pretty classic L5 and S1 symptoms posterior laterally on the left.  She has a history of disc protrusion and foraminal narrowing particularly on the left at L5.  She reports worsening with sitting.  She rates her pain as a 6 out of 10.    Referring: Dr. Doneen Poisson   ROS Otherwise per HPI.  Assessment & Plan: Visit Diagnoses:    ICD-10-CM   1. Lumbar radiculopathy  M54.16 XR C-ARM NO REPORT    Epidural Steroid injection    methylPREDNISolone acetate (DEPO-MEDROL) injection 80 mg    2. Radiculopathy due to lumbar intervertebral disc disorder  M51.16 XR C-ARM NO REPORT    Epidural Steroid injection    methylPREDNISolone acetate (DEPO-MEDROL) injection 80 mg      Plan: No additional findings.   Meds & Orders:  Meds ordered this encounter  Medications   methylPREDNISolone acetate (DEPO-MEDROL) injection 80 mg    Orders Placed This Encounter  Procedures   XR C-ARM NO REPORT   Epidural Steroid injection    Follow-up: No follow-ups on file.   Procedures: No procedures performed  Lumbosacral Transforaminal Epidural  Steroid Injection - Sub-Pedicular Approach with Fluoroscopic Guidance  Patient: Caroline Keller      Date of Birth: Jun 25, 1960 MRN: [id number] PCP: Jarrett Soho, PA-C      Visit Date: 10/24/2020   Universal Protocol:    Date/Time: 10/24/2020  Consent Given By: the patient  Position: PRONE  Additional Comments: Vital signs were monitored before and after the procedure. Patient was prepped and draped in the usual sterile fashion. The correct patient, procedure, and site was verified.   Injection Procedure Details:   Procedure diagnoses: Lumbar radiculopathy [M54.16]    Meds Administered:  Meds ordered this encounter  Medications   methylPREDNISolone acetate (DEPO-MEDROL) injection 80 mg    Laterality: Left  Location/Site:  L5-S1 S1-2  Needle:5.0 in., 22 ga.  Short bevel or Quincke spinal needle  Needle Placement: Transforaminal  Findings:    -Comments: Excellent flow of contrast along the nerve, nerve root and into the epidural space.  Procedure Details: After squaring off the end-plates to get a true AP view, the C-arm was positioned so that an oblique view of the foramen as noted above was visualized. The target area is just inferior to the "nose of the scotty dog" or sub pedicular. The soft tissues overlying this structure were infiltrated with 2-3 ml. of 1% Lidocaine without Epinephrine.  The spinal needle was inserted toward the target using a "trajectory" view along the fluoroscope beam.  Under AP and lateral visualization, the needle was advanced so it did not puncture dura and was located  close the 6 O'Clock position of the pedical in AP tracterory. Biplanar projections were used to confirm position. Aspiration was confirmed to be negative for CSF and/or blood. A 1-2 ml. volume of Isovue-250 was injected and flow of contrast was noted at each level. Radiographs were obtained for documentation purposes.   After attaining the desired flow of contrast documented  above, a 0.5 to 1.0 ml test dose of 0.25% Marcaine was injected into each respective transforaminal space.  The patient was observed for 90 seconds post injection.  After no sensory deficits were reported, and normal lower extremity motor function was noted,   the above injectate was administered so that equal amounts of the injectate were placed at each foramen (level) into the transforaminal epidural space.   Additional Comments:  The patient tolerated the procedure well Dressing: 2 x 2 sterile gauze and Band-Aid    Post-procedure details: Patient was observed during the procedure. Post-procedure instructions were reviewed.  Patient left the clinic in stable condition.    Clinical History: Market researcher, Incoming Rad Results - 03/18/2018  1:38 PM EST   FINDINGS:   Bones:  No suspicious osseous lesions. No acute fracture.   ALIGNMENT:  #  Trace anterolisthesis of T11 on T12.  #  Trace retrolisthesis of L1 on L2.  #  Trace retrolisthesis of L2 on L3.  #  Trace retrolisthesis of L3 on L4.  #  Trace retrolisthesis of L4 and L5.  #  Trace retrolisthesis of L5 on S1.  #  Dextroconvex curvature at T12-L1.  #  Levoconvex curvature at L3-L4.   DISC LEVELS:   T12-L1: No significant central or foraminal stenosis.   L1-L2: Mild degenerative disc disease. Mild disc bulge. Small left foraminal disc protrusion. No significant central or foraminal stenosis.   L2-L3: Mild degenerative disc disease. Uncovered/bulging disc extending into bilateral foramen. Mild facet degenerative changes. Trace left facet effusion. No significant central or foraminal stenosis.     L3-L4: Mild degenerative disc disease. Mild facet degenerative changes/ligamentum flavum thickening. Small bilateral facet effusions. Uncovered/diffusely bulging disc extending into the foramen. Mild bilateral foraminal stenosis. Mild central stenosis.   L4-L5: Mild degenerative disc disease. Mild diffuse disc bulge. Mild facet  degenerative changes/ligamentum flavum thickening.  Mild bilateral foraminal stenosis. Mild central stenosis.   L5-S1: Mild degenerative disc disease. Mild diffuse disc bulge and endplate spurring extending into bilateral foramen. Broad left posterolateral disc protrusion. Mild/moderate left foraminal stenosis. No right foraminal stenosis. No central stenosis.   SPINAL CORD:  No abnormal signal is demonstrated within the visualized distal spinal cord. Cauda equina appears unremarkable. Conus medullaris terminates at L1.   PARASPINAL TISSUES:  Intact.   VISUALIZED SACRUM AND LOWER THORACIC SPINE:  No significant abnormality.   INCIDENTAL:  T2 hyperintense focus in the anterior right kidney is incompletely imaged/characterized but may relate to cyst.  Small fat-containing umbilical hernia.    IMPRESSION:  1.  Multilevel degenerative changes of the lumbar spine, as detailed above.  2.  Foraminal stenosis is most pronounced and mild/moderate on the left at L5-S1 as result of a left posterolateral disc protrusion and endplate spurring.  3.  Mild central stenosis at L3-L4 and L4-L5.   Electronically Signed by: Maurene Capes ---- Lspine MRI L3-L4: 1 mm retrolisthesis. Annular rent with subligamentous protrusion extends to the RIGHT. Facet arthropathy and ligamentum flavum hypertrophy. No definite impingement. BILATERAL facet joint effusions.   L4-L5: Central and leftward protrusion. Asymmetric facet arthropathy and ligamentum flavum hypertrophy, also  on the LEFT. Disc material extends to the foramen. LEFT L5 nerve root impingement likely.   L5-S1: Unilateral LEFT L5 pars defect, with regional bone marrow edema. There is no subluxation. Shallow central and leftward protrusion. Possible LEFT L5 and/or LEFT S1 nerve root impingement.   Compared with most recent priors, 04/28/2013, a LEFT pars is fracture was probably present previously, but there is much more osseous edema, and  widening of the defect on today's exam.   IMPRESSION: LEFT L5 pars interarticularis defect, with regional bone marrow edema, new/increased from priors. No subluxation at L5-S1, but a central and leftward protrusion could affect the LEFT L5 and/or LEFT S1 nerve roots.   Central and leftward protrusion at L4-5, with asymmetric posterior element hypertrophy on that side. LEFT L5 nerve root impingement likely.   Lumbar spondylosis at L2-3 and L3-4, without definite impingement.   If further evaluation is desired, consider lumbar myelography with postmyelogram CT, with upright bending films to evaluate for dynamic instability, and the relative contributions of L4-5 and L5-S1 to the patient's LEFT leg symptomatology.     Electronically Signed   By: Elsie Stain M.D.   On: 09/09/2016 12:55     Objective:  VS:  HT:    WT:   BMI:     BP:128/81  HR:87bpm  TEMP: ( )  RESP:  Physical Exam Vitals and nursing note reviewed.  Constitutional:      General: She is not in acute distress.    Appearance: Normal appearance. She is not ill-appearing.  HENT:     Head: Normocephalic and atraumatic.     Right Ear: External ear normal.     Left Ear: External ear normal.  Eyes:     Extraocular Movements: Extraocular movements intact.  Cardiovascular:     Rate and Rhythm: Normal rate.     Pulses: Normal pulses.  Pulmonary:     Effort: Pulmonary effort is normal. No respiratory distress.  Abdominal:     General: There is no distension.     Palpations: Abdomen is soft.  Musculoskeletal:        General: Tenderness present.     Cervical back: Neck supple.     Right lower leg: No edema.     Left lower leg: No edema.     Comments: Patient has good distal strength with no pain over the greater trochanters.  No clonus or focal weakness.  Positive slump and dysesthesias in L5 dermatome.  Skin:    Findings: No erythema, lesion or rash.  Neurological:     General: No focal deficit present.      Mental Status: She is alert and oriented to person, place, and time.     Sensory: No sensory deficit.     Motor: No weakness or abnormal muscle tone.     Coordination: Coordination normal.  Psychiatric:        Mood and Affect: Mood normal.        Behavior: Behavior normal.     Imaging: XR C-ARM NO REPORT  Result Date: 10/24/2020 Please see Notes tab for imaging impression.

## 2020-10-25 NOTE — Procedures (Signed)
Lumbosacral Transforaminal Epidural Steroid Injection - Sub-Pedicular Approach with Fluoroscopic Guidance  Patient: Caroline Keller      Date of Birth: 1960-07-08 MRN: [id number] PCP: Jarrett Soho, PA-C      Visit Date: 10/24/2020   Universal Protocol:    Date/Time: 10/24/2020  Consent Given By: the patient  Position: PRONE  Additional Comments: Vital signs were monitored before and after the procedure. Patient was prepped and draped in the usual sterile fashion. The correct patient, procedure, and site was verified.   Injection Procedure Details:   Procedure diagnoses: Lumbar radiculopathy [M54.16]    Meds Administered:  Meds ordered this encounter  Medications   methylPREDNISolone acetate (DEPO-MEDROL) injection 80 mg    Laterality: Left  Location/Site:  L5-S1 S1-2  Needle:5.0 in., 22 ga.  Short bevel or Quincke spinal needle  Needle Placement: Transforaminal  Findings:    -Comments: Excellent flow of contrast along the nerve, nerve root and into the epidural space.  Procedure Details: After squaring off the end-plates to get a true AP view, the C-arm was positioned so that an oblique view of the foramen as noted above was visualized. The target area is just inferior to the "nose of the scotty dog" or sub pedicular. The soft tissues overlying this structure were infiltrated with 2-3 ml. of 1% Lidocaine without Epinephrine.  The spinal needle was inserted toward the target using a "trajectory" view along the fluoroscope beam.  Under AP and lateral visualization, the needle was advanced so it did not puncture dura and was located close the 6 O'Clock position of the pedical in AP tracterory. Biplanar projections were used to confirm position. Aspiration was confirmed to be negative for CSF and/or blood. A 1-2 ml. volume of Isovue-250 was injected and flow of contrast was noted at each level. Radiographs were obtained for documentation purposes.   After attaining the  desired flow of contrast documented above, a 0.5 to 1.0 ml test dose of 0.25% Marcaine was injected into each respective transforaminal space.  The patient was observed for 90 seconds post injection.  After no sensory deficits were reported, and normal lower extremity motor function was noted,   the above injectate was administered so that equal amounts of the injectate were placed at each foramen (level) into the transforaminal epidural space.   Additional Comments:  The patient tolerated the procedure well Dressing: 2 x 2 sterile gauze and Band-Aid    Post-procedure details: Patient was observed during the procedure. Post-procedure instructions were reviewed.  Patient left the clinic in stable condition.

## 2020-11-05 ENCOUNTER — Other Ambulatory Visit: Payer: Self-pay | Admitting: Cardiology

## 2021-01-09 ENCOUNTER — Ambulatory Visit (INDEPENDENT_AMBULATORY_CARE_PROVIDER_SITE_OTHER): Payer: PRIVATE HEALTH INSURANCE | Admitting: Orthopaedic Surgery

## 2021-01-09 ENCOUNTER — Other Ambulatory Visit: Payer: Self-pay

## 2021-01-09 DIAGNOSIS — G8929 Other chronic pain: Secondary | ICD-10-CM | POA: Diagnosis not present

## 2021-01-09 DIAGNOSIS — M25561 Pain in right knee: Secondary | ICD-10-CM

## 2021-01-09 MED ORDER — METHYLPREDNISOLONE ACETATE 40 MG/ML IJ SUSP
40.0000 mg | INTRAMUSCULAR | Status: AC | PRN
  Administered 2021-01-09: 40 mg via INTRA_ARTICULAR

## 2021-01-09 MED ORDER — LIDOCAINE HCL 1 % IJ SOLN
3.0000 mL | INTRAMUSCULAR | Status: AC | PRN
  Administered 2021-01-09: 3 mL

## 2021-01-09 NOTE — Progress Notes (Signed)
Office Visit Note   Patient: Caroline Keller           Date of Birth: [birth year]           MRN: [id number] Visit Date: 01/09/2021              Requested by: Jarrett Soho, PA-C [address] [place],  [place] [address] PCP: Jarrett Soho, PA-C   Assessment & Plan: Visit Diagnoses:  1. Chronic pain of right knee     Plan: Request I did place a steroid injection in her right knee today.  We will see her back at her regular scheduled appointment in November which will be close to a year out from her left knee replacement.  At that visit we will have a standing AP that was show both knees and the lateral of her left operative knee.  Follow-Up Instructions: Return in about 2 months (around 03/11/2021).   Orders:  Orders Placed This Encounter  Procedures   Large Joint Inj   No orders of the defined types were placed in this encounter.     Procedures: Large Joint Inj: R knee on 01/09/2021 11:07 AM Indications: diagnostic evaluation and pain Details: 22 G 1.5 in needle, superolateral approach  Arthrogram: No  Medications: 3 mL lidocaine 1 %; 40 mg methylPREDNISolone acetate 40 MG/ML Outcome: tolerated well, no immediate complications Procedure, treatment alternatives, risks and benefits explained, specific risks discussed. Consent was given by the patient. Immediately prior to procedure a time out was called to verify the correct patient, procedure, equipment, support staff and site/side marked as required. Patient was prepped and draped in the usual sterile fashion.      Clinical Data: No additional findings.   Subjective: Chief Complaint  Patient presents with   Right Knee - Pain    Wants injection  The patient comes in today requesting a steroid injection in her right knee.  Is been close to 4 months since we injected the right knee.  She works at [employer] and will be on her feet quite a bit with a functional marker that is approaching.  We did  replace her left knee in December of last year.  She said that knee is overall doing okay.  The right knee has been slowly getting worse in terms of her pain.  She is on her feet quite a bit.  HPI  Review of Systems There is currently listed no headache, chest pain, shortness of breath, fever, chills, nausea, vomiting.  She has had no acute illnesses.  Objective: Vital Signs: There were no vitals taken for this visit.  Physical Exam She is alert and orient x3 and in no acute distress Ortho Exam Examination of her right knee shows good range of motion with no effusion.  There is pain throughout the arc of motion and medial joint line tenderness.  Her left operative knee seems to be moving well.  There is a little bit of play with varus and valgus stressing but that is minimal.  There is no effusion. Specialty Comments:  No specialty comments available.  Imaging: No results found.   PMFS History: Patient Active Problem List   Diagnosis Date Noted   Status post total left knee replacement 04/12/2020   Unilateral primary osteoarthritis, left knee 02/02/2019   S/P arthroscopy of left knee 11/06/2018   Unilateral primary osteoarthritis, right knee 03/19/2018   Hyperlipidemia 02/25/2017   Chronic left-sided low back pain with left-sided sciatica 09/13/2016   Trigger  thumb, left thumb 09/13/2016   Trigger thumb, right thumb 09/13/2016   Substernal chest pain 01/03/2015   PVC's (premature ventricular contractions) 03/23/2014   PAC (premature atrial contraction) 03/23/2014   Benign essential HTN 03/23/2014   Hepatitis C 12/08/2013   GERD (gastroesophageal reflux disease)    Past Medical History:  Diagnosis Date   Anxiety    Arthritis of low back    GERD (gastroesophageal reflux disease)    Hepatitis C    History of C-section    History of cholecystectomy    History of hiatal hernia    Hyperlipidemia    Hypertension    PAC (premature atrial contraction)    Palpitations     PONV (postoperative nausea and vomiting)    PVC (premature ventricular contraction)     Family History  Problem Relation Age of Onset   Lung cancer Mother    Coronary artery disease Father    Breast cancer Sister    Colon cancer Neg Hx    Rectal cancer Neg Hx    Stomach cancer Neg Hx    Pancreatic cancer Neg Hx    Esophageal cancer Neg Hx     Past Surgical History:  Procedure Laterality Date   CARDIOVASCULAR STRESS TEST  01/04/2015   CESAREAN SECTION     CHOLECYSTECTOMY     KNEE SURGERY  10/2018   TOTAL KNEE ARTHROPLASTY Left 04/12/2020   Procedure: LEFT TOTAL KNEE ARTHROPLASTY;  Surgeon: Kathryne Hitch, MD;  Location: [facility] OR;  Service: Orthopedics;  Laterality: Left;  RNFA   Social History   Occupational History   Not on file  Tobacco Use   Smoking status: Former    Types: Cigarettes    Quit date: 04/16/1981    Years since quitting: 39.7   Smokeless tobacco: Never  Vaping Use   Vaping Use: Never used  Substance and Sexual Activity   Alcohol use: No   Drug use: No   Sexual activity: Not on file

## 2021-01-17 DIAGNOSIS — K219 Gastro-esophageal reflux disease without esophagitis: Secondary | ICD-10-CM | POA: Insufficient documentation

## 2021-03-22 ENCOUNTER — Ambulatory Visit: Payer: PRIVATE HEALTH INSURANCE | Admitting: Orthopaedic Surgery

## 2021-04-13 ENCOUNTER — Other Ambulatory Visit: Payer: Self-pay | Admitting: Cardiology

## 2021-05-30 ENCOUNTER — Ambulatory Visit (INDEPENDENT_AMBULATORY_CARE_PROVIDER_SITE_OTHER): Payer: 59 | Admitting: Orthopaedic Surgery

## 2021-05-30 ENCOUNTER — Encounter: Payer: Self-pay | Admitting: Orthopaedic Surgery

## 2021-05-30 ENCOUNTER — Other Ambulatory Visit: Payer: Self-pay

## 2021-05-30 ENCOUNTER — Ambulatory Visit (INDEPENDENT_AMBULATORY_CARE_PROVIDER_SITE_OTHER): Payer: 59

## 2021-05-30 DIAGNOSIS — Z96652 Presence of left artificial knee joint: Secondary | ICD-10-CM | POA: Diagnosis not present

## 2021-05-30 DIAGNOSIS — M4807 Spinal stenosis, lumbosacral region: Secondary | ICD-10-CM | POA: Diagnosis not present

## 2021-05-30 DIAGNOSIS — M1711 Unilateral primary osteoarthritis, right knee: Secondary | ICD-10-CM | POA: Diagnosis not present

## 2021-05-30 MED ORDER — LIDOCAINE HCL 1 % IJ SOLN
3.0000 mL | INTRAMUSCULAR | Status: AC | PRN
  Administered 2021-05-30: 3 mL

## 2021-05-30 MED ORDER — METHYLPREDNISOLONE ACETATE 40 MG/ML IJ SUSP
40.0000 mg | INTRAMUSCULAR | Status: AC | PRN
  Administered 2021-05-30: 40 mg via INTRA_ARTICULAR

## 2021-05-30 NOTE — Progress Notes (Signed)
Office Visit Note   Patient: Caroline Keller           Date of Birth: 1960-12-18           MRN: [id number] Visit Date: 05/30/2021              Requested by: Jarrett Soho, PA-C [address] [place],  [place] [address] PCP: Jarrett Soho, PA-C   Assessment & Plan: Visit Diagnoses:  1. History of left knee replacement   2. Primary osteoarthritis of right knee   3. Spinal stenosis of lumbosacral region     Plan: Per her request we placed a steroid injection of the right knee today.  She understands wait least 3 months between injections.  Recommend quad strengthening both knees.  Discussed knee friendly exercises with her.  She can apply Voltaren gel to the right knee mainly medial joint line 4 g up to 4 times daily.  In regards to her back will refer to Dr. Alvester Morin for epidural steroid injections of the lumbar spine.  She will follow-up with Dr. Magnus Ivan on an as-needed basis.  Questions were encouraged and answered by Dr. Magnus Ivan and myself.  Follow-Up Instructions: Return if symptoms worsen or fail to improve.   Orders:  Orders Placed This Encounter  Procedures   Large Joint Inj   XR Knee 1-2 Views Left   No orders of the defined types were placed in this encounter.     Procedures: Large Joint Inj on 05/30/2021 8:56 AM Details: 25 G needle, anterolateral approach Medications: 3 mL lidocaine 1 %; 40 mg methylPREDNISolone acetate 40 MG/ML     Clinical Data: No additional findings.   Subjective: Chief Complaint  Patient presents with   Right Knee - Follow-up    HPI Caroline Keller returns today for follow-up of her left total knee arthroplasty.  She states overall the left knee is doing well has some clicking in the knee but no significant pain.  She does have some numbness that she associates with the knee replacement from the knee down to the ankle she is to touch only.  She is having right knee pain denies any mechanical symptoms in the knee.  And pain mostly  around the patellar region.  She states she takes steps 1 at a time she is unsure if this is due to her back or knee.  She is having low back pain that radiates down past the knee into the feet at times.  No real numbness tingling.  Denies any bowel or bladder dysfunction, waking pain or saddle anesthesia like symptoms.  She has had prior epidural steroid injections with Dr. Alvester Morin and these have been beneficial.  She states that this is very similar symptoms that she was having prior to the last injections in July and her back and is asking if she can undergo the injections again with Dr. Alvester Morin that she found this beneficial.  Review of Systems  Constitutional:  Negative for chills and fever.  Musculoskeletal:  Positive for arthralgias and back pain.    Objective: Vital Signs: There were no vitals taken for this visit.  Physical Exam Constitutional:      Appearance: She is not ill-appearing or diaphoretic.  Pulmonary:     Effort: Pulmonary effort is normal.  Neurological:     Mental Status: She is alert and oriented to person, place, and time.  Psychiatric:        Mood and Affect: Mood normal.    Ortho Exam Lower  extremities: Negative straight leg raise bilaterally.  5 out of 5 strength throughout the lower extremities against resistance. Left knee full range of motion.  No gross instability valgus/ varus stressing.  Patellofemoral popping with range of motion of the left knee.  No abnormal warmth erythema surgical incisions well-healed. Right knee full range of motion.  Tenderness along medial joint line no instability valgus varus stressing.  Knee hyperextends slightly.  No abnormal warmth erythema of the right knee. Specialty Comments:  No specialty comments available.  Imaging: XR Knee 1-2 Views Left  Result Date: 05/30/2021 Left knee 2 views: Status post left total knee arthroplasty with well-seated components.  No complicating features.  Right knee seen on the AP view and shows  mild to moderate narrowing medial joint line with periarticular spurring.  Otherwise both knees well located.  No acute findings    PMFS History: Patient Active Problem List   Diagnosis Date Noted   GERD (gastroesophageal reflux disease) 01/17/2021   Status post total left knee replacement 04/12/2020   Unilateral primary osteoarthritis, left knee 02/02/2019   S/P arthroscopy of left knee 11/06/2018   Unilateral primary osteoarthritis, right knee 03/19/2018   Hyperlipidemia 02/25/2017   Chronic left-sided low back pain with left-sided sciatica 09/13/2016   Trigger thumb, left thumb 09/13/2016   Trigger thumb, right thumb 09/13/2016   Substernal chest pain 01/03/2015   PVC's (premature ventricular contractions) 03/23/2014   PAC (premature atrial contraction) 03/23/2014   Benign essential HTN 03/23/2014   Hepatitis C 12/08/2013   Past Medical History:  Diagnosis Date   Anxiety    Arthritis of low back    GERD (gastroesophageal reflux disease)    Hepatitis C    History of C-section    History of cholecystectomy    History of hiatal hernia    Hyperlipidemia    Hypertension    PAC (premature atrial contraction)    Palpitations    PONV (postoperative nausea and vomiting)    PVC (premature ventricular contraction)     Family History  Problem Relation Age of Onset   Lung cancer Mother    Coronary artery disease Father    Breast cancer Sister    Colon cancer Neg Hx    Rectal cancer Neg Hx    Stomach cancer Neg Hx    Pancreatic cancer Neg Hx    Esophageal cancer Neg Hx     Past Surgical History:  Procedure Laterality Date   CARDIOVASCULAR STRESS TEST  01/04/2015   CESAREAN SECTION     CHOLECYSTECTOMY     KNEE SURGERY  10/2018   TOTAL KNEE ARTHROPLASTY Left 04/12/2020   Procedure: LEFT TOTAL KNEE ARTHROPLASTY;  Surgeon: Kathryne Hitch, MD;  Location: [facility] OR;  Service: Orthopedics;  Laterality: Left;  RNFA   Social History   Occupational History   Not on file   Tobacco Use   Smoking status: Former    Types: Cigarettes    Quit date: 04/16/1981    Years since quitting: 40.1   Smokeless tobacco: Never  Vaping Use   Vaping Use: Never used  Substance and Sexual Activity   Alcohol use: No   Drug use: No   Sexual activity: Not on file

## 2021-07-10 ENCOUNTER — Ambulatory Visit (INDEPENDENT_AMBULATORY_CARE_PROVIDER_SITE_OTHER): Payer: 59 | Admitting: Physical Medicine and Rehabilitation

## 2021-07-10 ENCOUNTER — Encounter: Payer: Self-pay | Admitting: Physical Medicine and Rehabilitation

## 2021-07-10 ENCOUNTER — Ambulatory Visit: Payer: Self-pay

## 2021-07-10 ENCOUNTER — Other Ambulatory Visit: Payer: Self-pay

## 2021-07-10 DIAGNOSIS — M5416 Radiculopathy, lumbar region: Secondary | ICD-10-CM | POA: Diagnosis not present

## 2021-07-10 MED ORDER — METHYLPREDNISOLONE ACETATE 80 MG/ML IJ SUSP
80.0000 mg | Freq: Once | INTRAMUSCULAR | Status: AC
  Administered 2021-07-10: 80 mg

## 2021-07-10 NOTE — Patient Instructions (Signed)

## 2021-07-10 NOTE — Progress Notes (Signed)
Pt state lower back pain that travels down left leg. Pt state sitting makes the pain worse. Pt state she takes over the counter pain meds to help ease her pain. ? ?Numeric Pain Rating Scale and Functional Assessment ?Average Pain 4 ? ? ?In the last MONTH (on 0-10 scale) has pain interfered with the following? ? ?1. General activity like being  able to carry out your everyday physical activities such as walking, climbing stairs, carrying groceries, or moving a chair?  ?Rating(7) ? ? ?+Driver, -BT, -Dye Allergies. ? ?

## 2021-07-16 NOTE — Procedures (Signed)
Lumbosacral Transforaminal Epidural Steroid Injection - Sub-Pedicular Approach with Fluoroscopic Guidance ? ?Patient: Caroline Keller      ?Date of Birth: January 14, 1961 ?MRN: [id number] ?PCP: Jarrett Soho, PA-C      ?Visit Date: 07/10/2021 ?  ?Universal Protocol:    ?Date/Time: 07/10/2021 ? ?Consent Given By: the patient ? ?Position: PRONE ? ?Additional Comments: ?Vital signs were monitored before and after the procedure. ?Patient was prepped and draped in the usual sterile fashion. ?The correct patient, procedure, and site was verified. ? ? ?Injection Procedure Details:  ? ?Procedure diagnoses: Lumbar radiculopathy [M54.16]   ? ?Meds Administered:  ?Meds ordered this encounter  ?Medications  ? methylPREDNISolone acetate (DEPO-MEDROL) injection 80 mg  ? ? ?Laterality: Left ? ?Location/Site: L5 and S1 ? ?Needle:5.0 in., 22 ga.  Short bevel or Quincke spinal needle ? ?Needle Placement: Transforaminal ? ?Findings: ?  ? -Comments: Excellent flow of contrast along the nerve, nerve root and into the epidural space. ? ?Procedure Details: ?After squaring off the end-plates to get a true AP view, the C-arm was positioned so that an oblique view of the foramen as noted above was visualized. The target area is just inferior to the "nose of the scotty dog" or sub pedicular. The soft tissues overlying this structure were infiltrated with 2-3 ml. of 1% Lidocaine without Epinephrine. ? ?The spinal needle was inserted toward the target using a "trajectory" view along the fluoroscope beam.  Under AP and lateral visualization, the needle was advanced so it did not puncture dura and was located close the 6 O'Clock position of the pedical in AP tracterory. Biplanar projections were used to confirm position. Aspiration was confirmed to be negative for CSF and/or blood. A 1-2 ml. volume of Isovue-250 was injected and flow of contrast was noted at each level. Radiographs were obtained for documentation purposes.  ? ?After attaining the desired  flow of contrast documented above, a 0.5 to 1.0 ml test dose of 0.25% Marcaine was injected into each respective transforaminal space.  The patient was observed for 90 seconds post injection.  After no sensory deficits were reported, and normal lower extremity motor function was noted,   the above injectate was administered so that equal amounts of the injectate were placed at each foramen (level) into the transforaminal epidural space. ? ? ?Additional Comments:  ?The patient tolerated the procedure well ?Dressing: 2 x 2 sterile gauze and Band-Aid ?  ? ?Post-procedure details: ?Patient was observed during the procedure. ?Post-procedure instructions were reviewed. ? ?Patient left the clinic in stable condition. ? ?

## 2021-07-16 NOTE — Progress Notes (Signed)
? ?Caroline Keller - 60 y.o. female MRN [id number]  Date of birth: 1960/06/19 ? ?Office Visit Note: ?Visit Date: 07/10/2021 ?PCP: Jarrett Soho, PA-C ?Referred by: Jarrett Soho, PA-C ? ?Subjective: ?Chief Complaint  ?Patient presents with  ? Lower Back - Pain  ? Left Leg - Pain  ? ?HPI:  Caroline Keller is a 61 y.o. female who comes in today for planned repeat Left L5-S1 and S1-2  Lumbar Transforaminal epidural steroid injection with fluoroscopic guidance.  The patient has failed conservative care including home exercise, medications, time and activity modification.  This injection will be diagnostic and hopefully therapeutic.  Please see requesting physician notes for further details and justification. Patient received more than 50% pain relief from prior injection.  ? ?Referring: Dr. Doneen Poisson  ? ?ROS Otherwise per HPI. ? ?Assessment & Plan: ?Visit Diagnoses:  ?  ICD-10-CM   ?1. Lumbar radiculopathy  M54.16 XR C-ARM NO REPORT  ?  Epidural Steroid injection  ?  methylPREDNISolone acetate (DEPO-MEDROL) injection 80 mg  ?  ?  ?Plan: No additional findings.  ? ?Meds & Orders:  ?Meds ordered this encounter  ?Medications  ? methylPREDNISolone acetate (DEPO-MEDROL) injection 80 mg  ?  ?Orders Placed This Encounter  ?Procedures  ? XR C-ARM NO REPORT  ? Epidural Steroid injection  ?  ?Follow-up: Return for visit to requesting provider as needed.  ? ?Procedures: ?No procedures performed  ?Lumbosacral Transforaminal Epidural Steroid Injection - Sub-Pedicular Approach with Fluoroscopic Guidance ? ?Patient: Caroline Keller      ?Date of Birth: Sep 16, 1960 ?MRN: [id number] ?PCP: Jarrett Soho, PA-C      ?Visit Date: 07/10/2021 ?  ?Universal Protocol:    ?Date/Time: 07/10/2021 ? ?Consent Given By: the patient ? ?Position: PRONE ? ?Additional Comments: ?Vital signs were monitored before and after the procedure. ?Patient was prepped and draped in the usual sterile fashion. ?The correct patient, procedure, and site was  verified. ? ? ?Injection Procedure Details:  ? ?Procedure diagnoses: Lumbar radiculopathy [M54.16]   ? ?Meds Administered:  ?Meds ordered this encounter  ?Medications  ? methylPREDNISolone acetate (DEPO-MEDROL) injection 80 mg  ? ? ?Laterality: Left ? ?Location/Site: L5 and S1 ? ?Needle:5.0 in., 22 ga.  Short bevel or Quincke spinal needle ? ?Needle Placement: Transforaminal ? ?Findings: ?  ? -Comments: Excellent flow of contrast along the nerve, nerve root and into the epidural space. ? ?Procedure Details: ?After squaring off the end-plates to get a true AP view, the C-arm was positioned so that an oblique view of the foramen as noted above was visualized. The target area is just inferior to the "nose of the scotty dog" or sub pedicular. The soft tissues overlying this structure were infiltrated with 2-3 ml. of 1% Lidocaine without Epinephrine. ? ?The spinal needle was inserted toward the target using a "trajectory" view along the fluoroscope beam.  Under AP and lateral visualization, the needle was advanced so it did not puncture dura and was located close the 6 O'Clock position of the pedical in AP tracterory. Biplanar projections were used to confirm position. Aspiration was confirmed to be negative for CSF and/or blood. A 1-2 ml. volume of Isovue-250 was injected and flow of contrast was noted at each level. Radiographs were obtained for documentation purposes.  ? ?After attaining the desired flow of contrast documented above, a 0.5 to 1.0 ml test dose of 0.25% Marcaine was injected into each respective transforaminal space.  The patient was observed for 90 seconds post injection.  After no sensory deficits  were reported, and normal lower extremity motor function was noted,   the above injectate was administered so that equal amounts of the injectate were placed at each foramen (level) into the transforaminal epidural space. ? ? ?Additional Comments:  ?The patient tolerated the procedure well ?Dressing: 2 x 2  sterile gauze and Band-Aid ?  ? ?Post-procedure details: ?Patient was observed during the procedure. ?Post-procedure instructions were reviewed. ? ?Patient left the clinic in stable condition. ?  ? ?Clinical History: ?No specialty comments available.  ? ? ? ?Objective:  VS:  HT:    WT:   BMI:     BP:   HR: bpm  TEMP: ( )  RESP:  ?Physical Exam ?Vitals and nursing note reviewed.  ?Constitutional:   ?   General: She is not in acute distress. ?   Appearance: Normal appearance. She is not ill-appearing.  ?HENT:  ?   Head: Normocephalic and atraumatic.  ?   Right Ear: External ear normal.  ?   Left Ear: External ear normal.  ?Eyes:  ?   Extraocular Movements: Extraocular movements intact.  ?Cardiovascular:  ?   Rate and Rhythm: Normal rate.  ?   Pulses: Normal pulses.  ?Pulmonary:  ?   Effort: Pulmonary effort is normal. No respiratory distress.  ?Abdominal:  ?   General: There is no distension.  ?   Palpations: Abdomen is soft.  ?Musculoskeletal:     ?   General: Tenderness present.  ?   Cervical back: Neck supple.  ?   Right lower leg: No edema.  ?   Left lower leg: No edema.  ?   Comments: Patient has good distal strength with no pain over the greater trochanters.  No clonus or focal weakness.  ?Skin: ?   Findings: No erythema, lesion or rash.  ?Neurological:  ?   General: No focal deficit present.  ?   Mental Status: She is alert and oriented to person, place, and time.  ?   Sensory: No sensory deficit.  ?   Motor: No weakness or abnormal muscle tone.  ?   Coordination: Coordination normal.  ?Psychiatric:     ?   Mood and Affect: Mood normal.     ?   Behavior: Behavior normal.  ?  ? ?Imaging: ?No results found. ?

## 2021-07-31 ENCOUNTER — Other Ambulatory Visit: Payer: Self-pay | Admitting: Cardiology

## 2021-08-04 ENCOUNTER — Other Ambulatory Visit: Payer: Self-pay | Admitting: Cardiology

## 2021-08-31 ENCOUNTER — Other Ambulatory Visit: Payer: Self-pay | Admitting: Cardiology

## 2021-09-13 ENCOUNTER — Other Ambulatory Visit: Payer: Self-pay | Admitting: Cardiology

## 2021-09-18 ENCOUNTER — Telehealth: Payer: Self-pay | Admitting: Cardiology

## 2021-09-18 MED ORDER — TELMISARTAN-HCTZ 80-25 MG PO TABS: 1.0000 | ORAL_TABLET | Freq: Every day | ORAL | 0 refills | Status: DC

## 2021-09-18 MED ORDER — ATORVASTATIN CALCIUM 10 MG PO TABS: 10.0000 mg | ORAL_TABLET | Freq: Every day | ORAL | 0 refills | Status: DC

## 2021-09-18 NOTE — Telephone Encounter (Signed)
Pt's medications were sent to pt's pharmacy as requested. Confirmation received.  

## 2021-09-18 NOTE — Telephone Encounter (Signed)
*  STAT* If patient is at the pharmacy, call can be transferred to refill team.   1. Which medications need to be refilled? (please list name of each medication and dose if known) atorvastatin (LIPITOR) 10 MG tablet telmisartan-hydrochlorothiazide (MICARDIS HCT) 80-25 MG tablet  2. Which pharmacy/location (including street and city if local pharmacy) is medication to be sent to? [id number] - [place], [place] - [address]  3. Do they need a 30 day or 90 day supply? 30  Patient is scheduled to see Jari Favre 10/02/21

## 2021-09-18 NOTE — Addendum Note (Signed)
Addended by: Margaret Pyle D on: 09/18/2021 02:54 PM   Modules accepted: Orders

## 2021-09-25 ENCOUNTER — Other Ambulatory Visit: Payer: Self-pay | Admitting: Cardiology

## 2021-10-02 ENCOUNTER — Ambulatory Visit: Payer: 59 | Admitting: Physician Assistant

## 2021-10-09 ENCOUNTER — Ambulatory Visit: Payer: Self-pay

## 2021-10-09 ENCOUNTER — Ambulatory Visit (INDEPENDENT_AMBULATORY_CARE_PROVIDER_SITE_OTHER): Payer: 59 | Admitting: Orthopaedic Surgery

## 2021-10-09 DIAGNOSIS — M25511 Pain in right shoulder: Secondary | ICD-10-CM | POA: Diagnosis not present

## 2021-10-09 DIAGNOSIS — G8929 Other chronic pain: Secondary | ICD-10-CM | POA: Diagnosis not present

## 2021-10-09 DIAGNOSIS — M25512 Pain in left shoulder: Secondary | ICD-10-CM | POA: Diagnosis not present

## 2021-10-09 MED ORDER — LIDOCAINE HCL 1 % IJ SOLN
3.0000 mL | INTRAMUSCULAR | Status: AC | PRN
  Administered 2021-10-09: 3 mL

## 2021-10-09 MED ORDER — METHYLPREDNISOLONE ACETATE 40 MG/ML IJ SUSP
40.0000 mg | INTRAMUSCULAR | Status: AC | PRN
  Administered 2021-10-09: 40 mg via INTRA_ARTICULAR

## 2021-10-14 ENCOUNTER — Other Ambulatory Visit: Payer: Self-pay | Admitting: Cardiology

## 2021-10-19 NOTE — Progress Notes (Signed)
Office Visit    Patient Name: Caroline Keller Date of Encounter: 10/20/2021  PCP:  Jarrett Soho, PA-C   [facility]  Cardiologist:  Armanda Magic, MD  Advanced Practice Provider:  No care team member to display Electrophysiologist:  None   HPI    Caroline Keller is a 61 y.o. female with a hx of hypertension, PVCs/PACs, HLD, treated hepatitis C, hiatal hernia, palpitations presents today for 1 year follow-up visit.  She has history of palpitations with stress/anxiety which has been treated with diltiazem.  Nuclear study was normal in 2016, EF 59%.  She was seen by Georgie Chard, NP 02/2018 for routine follow-up and it was noted that she had both high blood pressure and high cholesterol.  Micardis was increased to 80/12.5 mg daily and she was started on atorvastatin 20 mg.  She was last seen in the office by Armanda Magic, MD 07/2020.  At that time she was doing well without any chest pain/pressure, SOB, DOE, PND, orthopnea, lower extremity edema, dizziness or syncope.  Occasionally she did endorse a skipped heartbeat but they were not particularly bothersome.  Today, she feels okay today without any new chest pain or shortness of breath.  She does occasionally have some skipped heartbeats but these were worked up prior and found to be PVCs and PACs.  She has been dealing with a lot of grief since her husband passed away in Mar 28, 2023 of last year.  She has been dealing with some insurance issues since then.  We discussed that she will need an updated lipid panel and we have ordered that today as well as LFTs.    Reports no shortness of breath nor dyspnea on exertion. Reports no chest pain, pressure, or tightness. No edema, orthopnea, PND. Reports no palpitations that are different that what she usually experiences,    Past Medical History    Past Medical History:  Diagnosis Date   Anxiety    Arthritis of low back    GERD (gastroesophageal reflux disease)    Hepatitis C     History of C-section    History of cholecystectomy    History of hiatal hernia    Hyperlipidemia    Hypertension    PAC (premature atrial contraction)    Palpitations    PONV (postoperative nausea and vomiting)    PVC (premature ventricular contraction)    Past Surgical History:  Procedure Laterality Date   CARDIOVASCULAR STRESS TEST  01/04/2015   CESAREAN SECTION     CHOLECYSTECTOMY     KNEE SURGERY  10/2018   TOTAL KNEE ARTHROPLASTY Left 04/12/2020   Procedure: LEFT TOTAL KNEE ARTHROPLASTY;  Surgeon: Kathryne Hitch, MD;  Location: [facility] OR;  Service: Orthopedics;  Laterality: Left;  RNFA    Allergies  No Known Allergies   EKGs/Labs/Other Studies Reviewed:   The following studies were reviewed today:  Exercise Tolerance Test  01/03/2015  IMPRESSION: 1. No reversible ischemia or infarction.   2. Normal left ventricular wall motion.   3. Left ventricular ejection fraction 59%   4. Low-risk stress test findings*.    EKG:  EKG is  ordered today.  The ekg ordered today demonstrates NSR rate 91 bpm  Recent Labs: No results found for requested labs within last 365 days.  Recent Lipid Panel    Component Value Date/Time   CHOL 193 08/11/2020 1330   TRIG 64 08/11/2020 1330   HDL 93 08/11/2020 1330   CHOLHDL 2.1 08/11/2020 1330  CHOLHDL 3.3 01/04/2015 0211   VLDL 7 01/04/2015 0211   LDLCALC 88 08/11/2020 1330   Home Medications   Current Meds  Medication Sig   atorvastatin (LIPITOR) 10 MG tablet TAKE 1 TABLET BY MOUTH EVERY DAY   calcium carbonate (OSCAL) 1500 (600 Ca) MG TABS tablet Take 600 mg of elemental calcium by mouth 2 (two) times daily.   clonazePAM (KLONOPIN) 1 MG tablet Take 1 tablet by mouth at bedtime as needed (sleep/anxiety).   diltiazem (CARDIZEM CD) 360 MG 24 hr capsule TAKE 1 CAPSULE BY MOUTH EVERY DAY   escitalopram (LEXAPRO) 20 MG tablet Take 20 mg by mouth daily.   ibuprofen (ADVIL) 200 MG tablet Take 400 mg by mouth every 6 (six)  hours as needed for headache or moderate pain.   pantoprazole (PROTONIX) 40 MG tablet Take 1 tablet by mouth daily.   telmisartan-hydrochlorothiazide (MICARDIS HCT) 80-25 MG tablet TAKE 1 TABLET BY MOUTH EVERY DAY     Review of Systems      All other systems reviewed and are otherwise negative except as noted above.  Physical Exam    VS:  BP 120/70   Pulse 91   Ht 5\' 4"  (1.626 m)   Wt 203 lb (92.1 kg)   SpO2 96%   BMI 34.84 kg/m  , BMI Body mass index is 34.84 kg/m.  Wt Readings from Last 3 Encounters:  10/20/21 203 lb (92.1 kg)  08/11/20 203 lb 9.6 oz (92.4 kg)  04/12/20 208 lb 8.9 oz (94.6 kg)     GEN: Well nourished, well developed, in no acute distress. HEENT: normal. Neck: Supple, no JVD, carotid bruits, or masses. Cardiac: RRR, no murmurs, rubs, or gallops. No clubbing, cyanosis, edema.  Radials/PT 2+ and equal bilaterally.  Respiratory:  Respirations regular and unlabored, clear to auscultation bilaterally. GI: Soft, nontender, nondistended. MS: No deformity or atrophy. Skin: Warm and dry, no rash. Neuro:  Strength and sensation are intact. Psych: Normal affect.  Assessment & Plan    Hypertension -Blood pressure well controlled today 120/70 -Continue current medication regimen with Micardis 80-25 mg daily, and Cardizem CD3 160 mg daily  Hyperlipidemia -Continue Lipitor 10 mg daily -We will plan for LFTs and lipid panel which was ordered today  PVCs and PACs -Not particularly bothersome -EKG today shows normal sinus rhythm, rate 91 bpm   Disposition: Follow up 1 year with 04/14/20, MD or APP.  Signed, Armanda Magic, PA-C 10/20/2021, 2:42 PM [facility]

## 2021-10-20 ENCOUNTER — Ambulatory Visit (INDEPENDENT_AMBULATORY_CARE_PROVIDER_SITE_OTHER): Payer: 59 | Admitting: Physician Assistant

## 2021-10-20 ENCOUNTER — Encounter: Payer: Self-pay | Admitting: Physician Assistant

## 2021-10-20 VITALS — BP 120/70 | HR 91 | Ht 64.0 in | Wt 203.0 lb

## 2021-10-20 DIAGNOSIS — I10 Essential (primary) hypertension: Secondary | ICD-10-CM

## 2021-10-20 DIAGNOSIS — E782 Mixed hyperlipidemia: Secondary | ICD-10-CM

## 2021-10-20 DIAGNOSIS — I493 Ventricular premature depolarization: Secondary | ICD-10-CM | POA: Diagnosis not present

## 2021-10-20 DIAGNOSIS — I491 Atrial premature depolarization: Secondary | ICD-10-CM

## 2021-10-20 NOTE — Patient Instructions (Addendum)
Medication Instructions:  Your physician recommends that you continue on your current medications as directed. Please refer to the Current Medication list given to you today.  *If you need a refill on your cardiac medications before your next appointment, please call your pharmacy*   Lab Work: Fasting lipids and lft's next Friday 10/27/2021 anytime between 7:15 AM and 4:15 PM If you have labs (blood work) drawn today and your tests are completely normal, you will receive your results only by: MyChart Message (if you have MyChart) OR A paper copy in the mail If you have any lab test that is abnormal or we need to change your treatment, we will call you to review the results.  Follow-Up: At [facility], you and your health needs are our priority.  As part of our continuing mission to provide you with exceptional heart care, we have created designated Provider Care Teams.  These Care Teams include your primary Cardiologist (physician) and Advanced Practice Providers (APPs -  Physician Assistants and Nurse Practitioners) who all work together to provide you with the care you need, when you need it.  Your next appointment:   1 year(s)  The format for your next appointment:   In Person  Provider:   Armanda Magic, MD {  Important Information About Sugar

## 2021-10-21 IMAGING — DX DG KNEE 1-2V PORT*L*
1 series · 2 of 2 positions shown · non-contrast
Comparison: 02/03/2020

CLINICAL DATA: Post left total knee replacement.

EXAM:
PORTABLE LEFT KNEE - 1-2 VIEW

[Series 1: knee · 0.14mm/px · 2 of 2 slices shown]
[im 1/2]
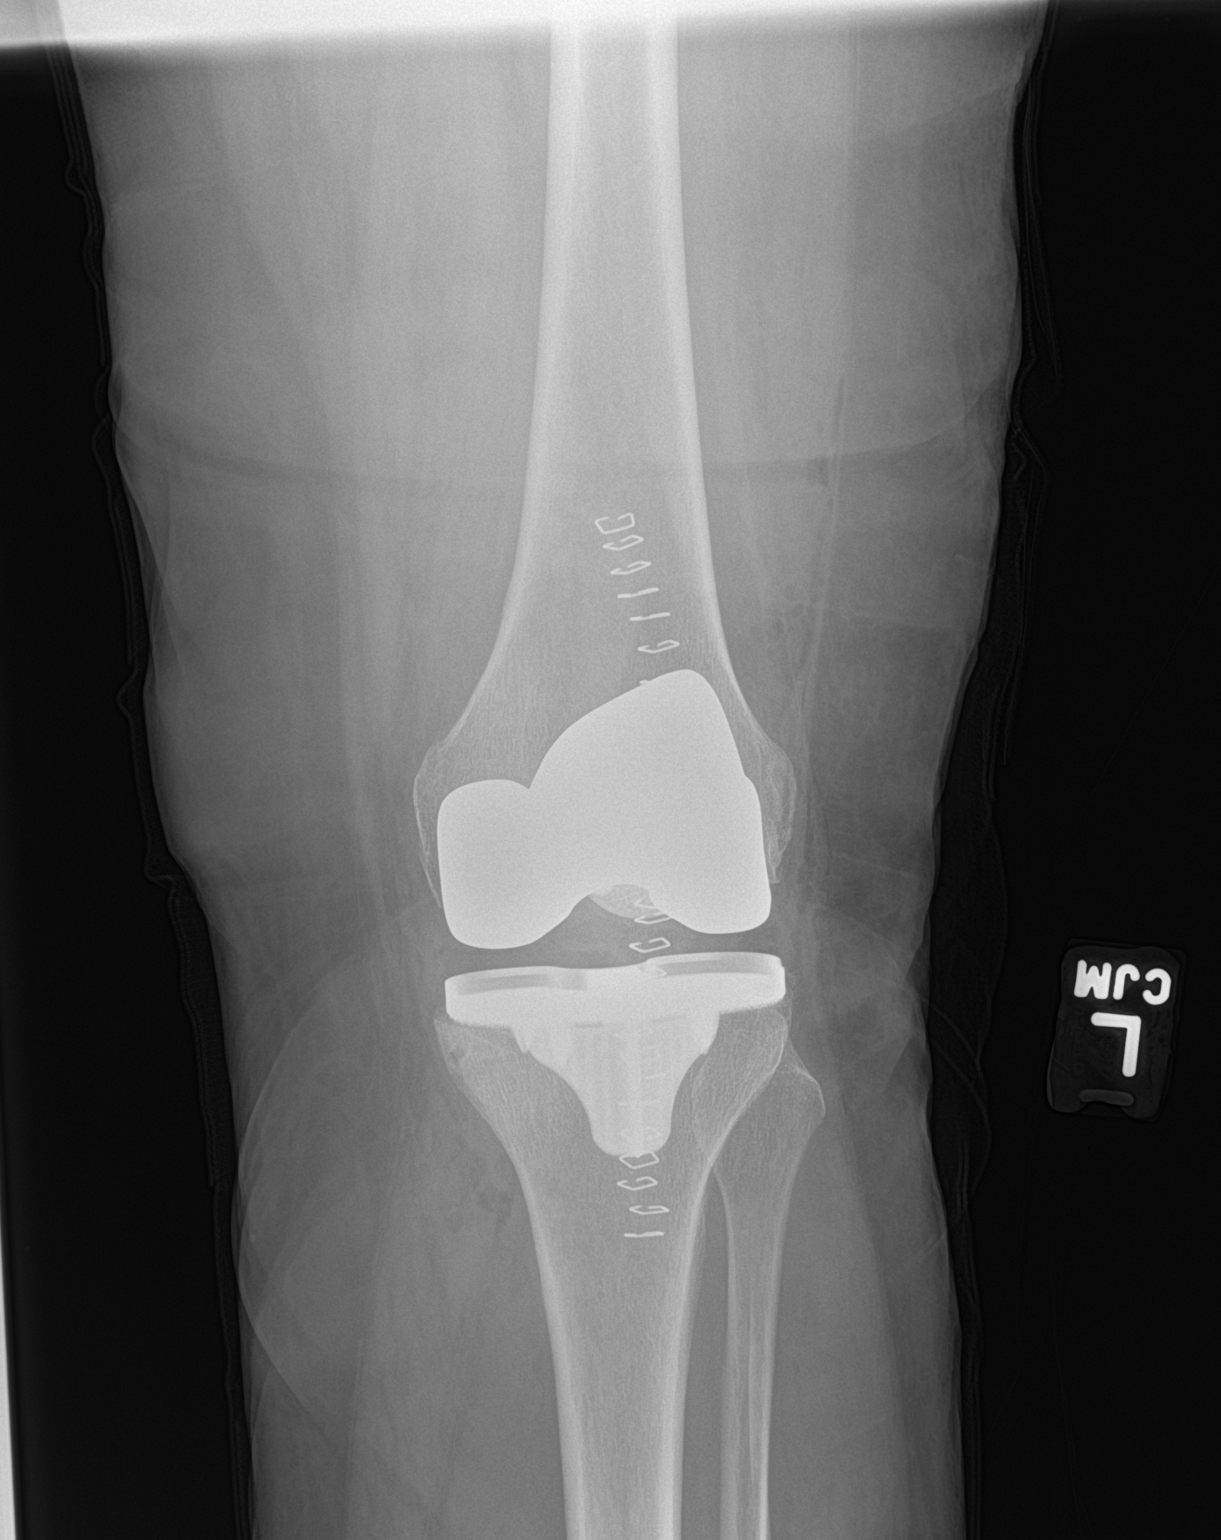
[im 2/2]
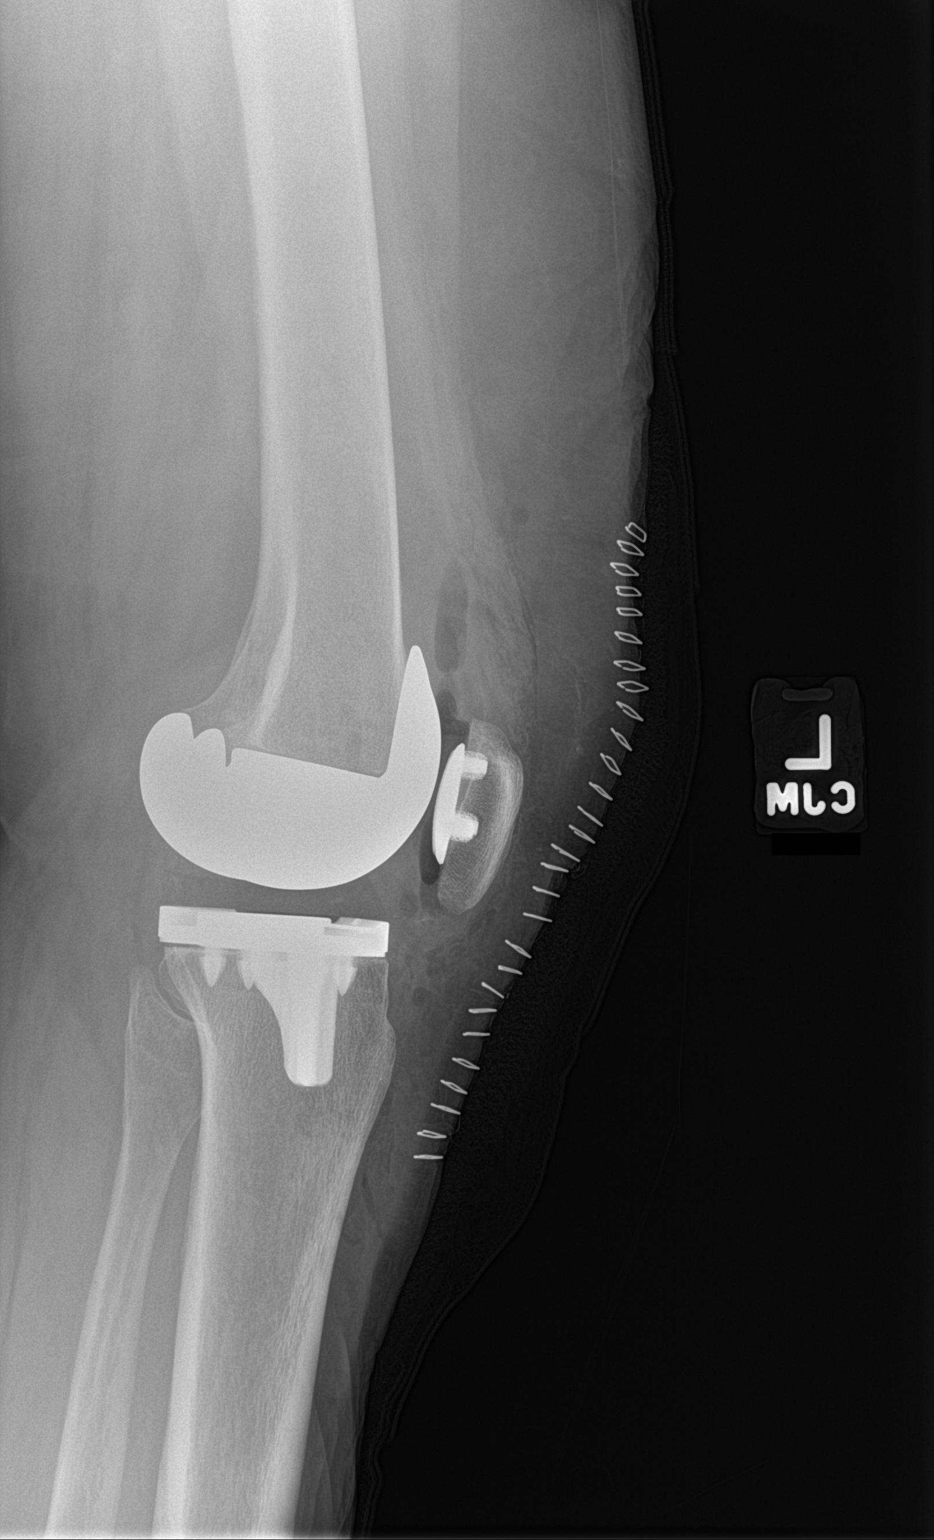

[2 of 2 positions shown; findings below may reference images not displayed]

FINDINGS: Post left total knee replacement. Alignment appears anatomic. No
fracture or dislocation. There is an expected small knee joint
effusion with associated intra-articular air. Midline skin staples.
No radiopaque foreign body.
IMPRESSION: Post left total knee replacement without evidence of complication.

## 2021-10-26 ENCOUNTER — Other Ambulatory Visit: Payer: Self-pay | Admitting: Cardiology

## 2021-10-27 ENCOUNTER — Other Ambulatory Visit: Payer: 59

## 2021-10-30 ENCOUNTER — Ambulatory Visit: Payer: 59 | Admitting: Orthopaedic Surgery

## 2021-11-09 ENCOUNTER — Other Ambulatory Visit: Payer: Self-pay | Admitting: Cardiology

## 2022-01-15 ENCOUNTER — Ambulatory Visit: Payer: Self-pay

## 2022-01-15 ENCOUNTER — Ambulatory Visit (INDEPENDENT_AMBULATORY_CARE_PROVIDER_SITE_OTHER): Payer: 59 | Admitting: Orthopaedic Surgery

## 2022-01-15 ENCOUNTER — Ambulatory Visit (INDEPENDENT_AMBULATORY_CARE_PROVIDER_SITE_OTHER): Payer: 59

## 2022-01-15 DIAGNOSIS — G8929 Other chronic pain: Secondary | ICD-10-CM

## 2022-01-15 DIAGNOSIS — M25561 Pain in right knee: Secondary | ICD-10-CM | POA: Diagnosis not present

## 2022-01-15 DIAGNOSIS — M25512 Pain in left shoulder: Secondary | ICD-10-CM

## 2022-01-15 DIAGNOSIS — M25511 Pain in right shoulder: Secondary | ICD-10-CM

## 2022-01-15 MED ORDER — METHOCARBAMOL 750 MG PO TABS: 750.0000 mg | ORAL_TABLET | Freq: Three times a day (TID) | ORAL | 1 refills | Status: DC | PRN

## 2022-01-15 MED ORDER — METHYLPREDNISOLONE ACETATE 40 MG/ML IJ SUSP
40.0000 mg | INTRAMUSCULAR | Status: AC | PRN
  Administered 2022-01-15: 40 mg via INTRA_ARTICULAR

## 2022-01-15 MED ORDER — OXYCODONE-ACETAMINOPHEN 5-325 MG PO TABS: 1.0000 | ORAL_TABLET | Freq: Four times a day (QID) | ORAL | 0 refills | Status: DC | PRN

## 2022-01-15 MED ORDER — LIDOCAINE HCL 1 % IJ SOLN
3.0000 mL | INTRAMUSCULAR | Status: AC | PRN
  Administered 2022-01-15: 3 mL

## 2022-01-15 NOTE — Progress Notes (Signed)
Office Visit Note   Patient: Caroline Keller           Date of Birth: 20-Dec-1960           MRN: [id number] Visit Date: 01/15/2022              Requested by: Jarrett Soho, PA-C [address] [place],  [place] [address] PCP: Jarrett Soho, PA-C   Assessment & Plan: Visit Diagnoses:  1. Chronic left shoulder pain   2. Chronic right shoulder pain   3. Chronic pain of right knee     Plan: I did provide steroid injections in both shoulder subacromial outlets.  Also provided a steroid injection in her left trapezius area around the trigger point.  At this point we should obtain an MRI of her left shoulder given the severity of her pain and weakness of that left shoulder.  This is to assess the rotator cuff and other possible pathology as a relates to the severity of her pain for her left shoulder.  When we do see her back at the next visit, we can always inject her right knee at that point with a steroid if needed.  I will send in some Percocet and methocarbamol.  Follow-Up Instructions: No follow-ups on file.   She will call for follow-up appointment once she has the date of the MRI of her left shoulder.  Orders:  Orders Placed This Encounter  Procedures   Large Joint Inj: R knee   Large Joint Inj: R subacromial bursa   Large Joint Inj: L subacromial bursa   XR Shoulder Left   XR Shoulder Right   Meds ordered this encounter  Medications   oxyCODONE-acetaminophen (PERCOCET/ROXICET) 5-325 MG tablet    Sig: Take 1-2 tablets by mouth every 6 (six) hours as needed for severe pain.    Dispense:  30 tablet    Refill:  0   methocarbamol (ROBAXIN) 750 MG tablet    Sig: Take 1 tablet (750 mg total) by mouth every 8 (eight) hours as needed for muscle spasms.    Dispense:  40 tablet    Refill:  1      Procedures: Large Joint Inj: R subacromial bursa on 01/15/2022 2:24 PM Indications: pain and diagnostic evaluation Details: 22 G 1.5 in needle  Arthrogram: No  Medications: 3 mL  lidocaine 1 %; 40 mg methylPREDNISolone acetate 40 MG/ML Outcome: tolerated well, no immediate complications Procedure, treatment alternatives, risks and benefits explained, specific risks discussed. Consent was given by the patient. Immediately prior to procedure a time out was called to verify the correct patient, procedure, equipment, support staff and site/side marked as required. Patient was prepped and draped in the usual sterile fashion.    Large Joint Inj: L subacromial bursa on 01/15/2022 2:24 PM Indications: pain and diagnostic evaluation Details: 22 G 1.5 in needle  Arthrogram: No  Medications: 3 mL lidocaine 1 %; 40 mg methylPREDNISolone acetate 40 MG/ML Outcome: tolerated well, no immediate complications Procedure, treatment alternatives, risks and benefits explained, specific risks discussed. Consent was given by the patient. Immediately prior to procedure a time out was called to verify the correct patient, procedure, equipment, support staff and site/side marked as required. Patient was prepped and draped in the usual sterile fashion.       Clinical Data: No additional findings.   Subjective: Chief Complaint  Patient presents with   Left Shoulder - Pain   Right Shoulder - Pain  The patient is someone that is  well-known to me.  I have injected both shoulders before and she comes in today almost tearful given the fact that her left shoulder is hurting significantly and it is radiating into her neck.  It does get around the elbow but there is no numbness and tingling and on exam it seems to be related more to her left shoulder.  I believe she is developing some trigger point pain in the trapezius area on the left side.  She hurts with overhead activities and is getting significantly painful for her and keep her up at night.  Steroid injections have helped some in the past but now they are not as helpful.  She denies any specific injury.  We have not x-rayed her shoulders as of  yet.  She is not a diabetic.  HPI  Review of Systems There is currently listed no fever, chills, nausea, vomiting  Objective: Vital Signs: There were no vitals taken for this visit.  Physical Exam She is alert and oriented x3 and in no acute distress but obvious discomfort Ortho Exam Examination of both her shoulder shows good there is no blocks to rotation.  She hurts severely in her left shoulder past 90 degrees of abduction and has some giveaway pain and strength as well.  She is very tender in the trapezius area and shows signs of trigger point.  The right shoulder moves much more smoothly and shows some signs of impingement.  Distally her motor and sensory exam in both upper extremities is normal. Specialty Comments:  No specialty comments available.  Imaging: XR Shoulder Left  Result Date: 01/15/2022 3 views of the left shoulder show no acute findings.  XR Shoulder Right  Result Date: 01/15/2022 3 views of the right shoulder show no acute findings.  The shoulder is well located.    PMFS History: Patient Active Problem List   Diagnosis Date Noted   GERD (gastroesophageal reflux disease) 01/17/2021   Status post total left knee replacement 04/12/2020   Unilateral primary osteoarthritis, left knee 02/02/2019   S/P arthroscopy of left knee 11/06/2018   Unilateral primary osteoarthritis, right knee 03/19/2018   Hyperlipidemia 02/25/2017   Chronic left-sided low back pain with left-sided sciatica 09/13/2016   Trigger thumb, left thumb 09/13/2016   Trigger thumb, right thumb 09/13/2016   Substernal chest pain 01/03/2015   PVC's (premature ventricular contractions) 03/23/2014   PAC (premature atrial contraction) 03/23/2014   Benign essential HTN 03/23/2014   Hepatitis C 12/08/2013   Past Medical History:  Diagnosis Date   Anxiety    Arthritis of low back    GERD (gastroesophageal reflux disease)    Hepatitis C    History of C-section    History of cholecystectomy     History of hiatal hernia    Hyperlipidemia    Hypertension    PAC (premature atrial contraction)    Palpitations    PONV (postoperative nausea and vomiting)    PVC (premature ventricular contraction)     Family History  Problem Relation Age of Onset   Lung cancer Mother    Coronary artery disease Father    Breast cancer Sister    Colon cancer Neg Hx    Rectal cancer Neg Hx    Stomach cancer Neg Hx    Pancreatic cancer Neg Hx    Esophageal cancer Neg Hx     Past Surgical History:  Procedure Laterality Date   CARDIOVASCULAR STRESS TEST  01/04/2015   CESAREAN SECTION     CHOLECYSTECTOMY  KNEE SURGERY  10/2018   TOTAL KNEE ARTHROPLASTY Left 04/12/2020   Procedure: LEFT TOTAL KNEE ARTHROPLASTY;  Surgeon: Mcarthur Rossetti, MD;  Location: [facility];  Service: Orthopedics;  Laterality: Left;  RNFA   Social History   Occupational History   Not on file  Tobacco Use   Smoking status: Former    Types: Cigarettes    Quit date: 04/16/1981    Years since quitting: 40.7   Smokeless tobacco: Never  Vaping Use   Vaping Use: Never used  Substance and Sexual Activity   Alcohol use: No   Drug use: No   Sexual activity: Not on file

## 2022-01-16 ENCOUNTER — Other Ambulatory Visit: Payer: Self-pay

## 2022-01-16 DIAGNOSIS — G8929 Other chronic pain: Secondary | ICD-10-CM

## 2022-02-03 ENCOUNTER — Other Ambulatory Visit: Payer: 59

## 2022-02-04 ENCOUNTER — Ambulatory Visit
Admission: RE | Admit: 2022-02-04 | Discharge: 2022-02-04 | Disposition: A | Discharge status: Home / Self Care | Payer: 59 | Source: Ambulatory Visit | Attending: Orthopaedic Surgery | Admitting: Orthopaedic Surgery

## 2022-02-04 DIAGNOSIS — M19012 Primary osteoarthritis, left shoulder: Secondary | ICD-10-CM | POA: Diagnosis not present

## 2022-02-04 DIAGNOSIS — G8929 Other chronic pain: Secondary | ICD-10-CM

## 2022-02-04 DIAGNOSIS — M75102 Unspecified rotator cuff tear or rupture of left shoulder, not specified as traumatic: Secondary | ICD-10-CM | POA: Diagnosis not present

## 2022-02-04 DIAGNOSIS — R600 Localized edema: Secondary | ICD-10-CM | POA: Diagnosis not present

## 2022-02-07 ENCOUNTER — Encounter: Payer: Self-pay | Admitting: Orthopaedic Surgery

## 2022-02-07 ENCOUNTER — Ambulatory Visit (INDEPENDENT_AMBULATORY_CARE_PROVIDER_SITE_OTHER): Payer: 59 | Admitting: Orthopaedic Surgery

## 2022-02-07 DIAGNOSIS — M75112 Incomplete rotator cuff tear or rupture of left shoulder, not specified as traumatic: Secondary | ICD-10-CM | POA: Diagnosis not present

## 2022-02-07 DIAGNOSIS — M7542 Impingement syndrome of left shoulder: Secondary | ICD-10-CM | POA: Diagnosis not present

## 2022-02-07 DIAGNOSIS — G8929 Other chronic pain: Secondary | ICD-10-CM

## 2022-02-07 DIAGNOSIS — M25512 Pain in left shoulder: Secondary | ICD-10-CM

## 2022-02-07 NOTE — Progress Notes (Signed)
The patient is well-known to me.  She is here today to go over her MRI of her left shoulder.  She does have bilateral shoulder pain and does a lot of repetitive activities over the years.  It hurts reaching overhead and behind her quite a bit.  Both shoulders bother her but the left was hurting at the worst.  We have tried at least 2 steroid injections in the subacromial outlet.  The first injection helped quite a bit and the second 1 has not helped as much.  This was done in early October.  MRIs reviewed with her of her left shoulder.  It does show partial bursal surface tearing of the rotator cuff and evidence of subdeltoid and subacromial fluid consistent with bursitis.  She has moderate AC joint arthritis with some reactive subchondral edema associated with arthritis in the [facility] joint.  Glenohumeral joint looks good.  The biceps tendon and labrum are normal.  Left shoulder hurts mainly with overhead activities and reaching behind her.  There is positive Neer and Hawkins signs but no significant weakness of the rotator cuff.  We talked about activity modification from a work standpoint.  I did describe arthroscopic surgery for the left shoulder as a treatment for which she is dealing with.  I described the surgery involves using a shoulder model.  She said that she would probably wait until the first of the year to consider surgery.  We can always repeat an injection in that shoulder once its been 12 weeks past the last injection.  All question concerns were answered addressed.  She will let us know.

## 2022-03-26 ENCOUNTER — Ambulatory Visit
Admission: EM | Admit: 2022-03-26 | Discharge: 2022-03-26 | Disposition: A | Discharge status: Home / Self Care | Payer: 59 | Attending: Emergency Medicine | Admitting: Emergency Medicine

## 2022-03-26 ENCOUNTER — Ambulatory Visit (INDEPENDENT_AMBULATORY_CARE_PROVIDER_SITE_OTHER): Payer: 59

## 2022-03-26 ENCOUNTER — Ambulatory Visit: Payer: 59

## 2022-03-26 DIAGNOSIS — S92414A Nondisplaced fracture of proximal phalanx of right great toe, initial encounter for closed fracture: Secondary | ICD-10-CM

## 2022-03-26 DIAGNOSIS — M7989 Other specified soft tissue disorders: Secondary | ICD-10-CM | POA: Diagnosis not present

## 2022-03-26 MED ORDER — IBUPROFEN 800 MG PO TABS
800.0000 mg | ORAL_TABLET | Freq: Once | ORAL | Status: AC
  Administered 2022-03-26: 800 mg via ORAL

## 2022-03-26 MED ORDER — IBUPROFEN 800 MG PO TABS: 800.0000 mg | ORAL_TABLET | Freq: Three times a day (TID) | ORAL | 0 refills | Status: DC | PRN

## 2022-03-26 NOTE — ED Triage Notes (Signed)
Pt reports bruise, swelling and pain in right foot after she missed a step today and fell.

## 2022-03-26 NOTE — ED Provider Notes (Signed)
[facility]    CSN: [id number] Arrival date & time: 03/26/22  1246    HISTORY   Chief Complaint  Patient presents with   Fall   Foot Injury   HPI Caroline Keller is a pleasant, 61 y.o. female who presents to urgent care today. Patient complains of bruising, swelling and pain on her right foot after tripping and falling secondary to missing a step while going down the stairs.  Patient states she had bunion surgery on her right foot and that during her surgery, her surgeon preemptively broke her right second and third toes and we pinned them to prevent them from curling under.  The history is provided by the patient.   Past Medical History:  Diagnosis Date   Anxiety    Arthritis of low back    GERD (gastroesophageal reflux disease)    Hepatitis C    History of C-section    History of cholecystectomy    History of hiatal hernia    Hyperlipidemia    Hypertension    PAC (premature atrial contraction)    Palpitations    PONV (postoperative nausea and vomiting)    PVC (premature ventricular contraction)    Patient Active Problem List   Diagnosis Date Noted   GERD (gastroesophageal reflux disease) 01/17/2021   Status post total left knee replacement 04/12/2020   Unilateral primary osteoarthritis, left knee 02/02/2019   S/P arthroscopy of left knee 11/06/2018   Unilateral primary osteoarthritis, right knee 03/19/2018   Hyperlipidemia 02/25/2017   Chronic left-sided low back pain with left-sided sciatica 09/13/2016   Trigger thumb, left thumb 09/13/2016   Trigger thumb, right thumb 09/13/2016   Substernal chest pain 01/03/2015   PVC's (premature ventricular contractions) 03/23/2014   PAC (premature atrial contraction) 03/23/2014   Benign essential HTN 03/23/2014   Hepatitis C 12/08/2013   Past Surgical History:  Procedure Laterality Date   CARDIOVASCULAR STRESS TEST  01/04/2015   CESAREAN SECTION     CHOLECYSTECTOMY     KNEE SURGERY  10/2018   TOTAL KNEE  ARTHROPLASTY Left 04/12/2020   Procedure: LEFT TOTAL KNEE ARTHROPLASTY;  Surgeon: Kathryne Hitch, MD;  Location: [facility];  Service: Orthopedics;  Laterality: Left;  RNFA   OB History   No obstetric history on file.    Home Medications    Prior to Admission medications   Medication Sig Start Date End Date Taking? Authorizing Provider  atorvastatin (LIPITOR) 10 MG tablet TAKE 1 TABLET BY MOUTH EVERY DAY 11/09/21   Quintella Reichert, MD  calcium carbonate (OSCAL) 1500 (600 Ca) MG TABS tablet Take 600 mg of elemental calcium by mouth 2 (two) times daily.    [provider]  clonazePAM (KLONOPIN) 1 MG tablet Take 1 tablet by mouth at bedtime as needed (sleep/anxiety). 02/05/17   [provider]  diltiazem (CARDIZEM CD) 360 MG 24 hr capsule TAKE 1 CAPSULE BY MOUTH EVERY DAY 10/26/21   Quintella Reichert, MD  escitalopram (LEXAPRO) 20 MG tablet Take 20 mg by mouth daily. 01/06/16   [provider]  ibuprofen (ADVIL) 200 MG tablet Take 400 mg by mouth every 6 (six) hours as needed for headache or moderate pain.    [provider]  methocarbamol (ROBAXIN) 750 MG tablet Take 1 tablet (750 mg total) by mouth every 8 (eight) hours as needed for muscle spasms. 01/15/22   Kathryne Hitch, MD  oxyCODONE-acetaminophen (PERCOCET/ROXICET) 5-325 MG tablet Take 1-2 tablets by mouth every 6 (six) hours as  needed for severe pain. 01/15/22   Kathryne Hitch, MD  pantoprazole (PROTONIX) 40 MG tablet Take 1 tablet by mouth daily. 08/09/20   [provider]  telmisartan-hydrochlorothiazide (MICARDIS HCT) 80-25 MG tablet TAKE 1 TABLET BY MOUTH EVERY DAY 11/09/21   Quintella Reichert, MD    Family History Family History  Problem Relation Age of Onset   Lung cancer Mother    Coronary artery disease Father    Breast cancer Sister    Colon cancer Neg Hx    Rectal cancer Neg Hx    Stomach cancer Neg Hx    Pancreatic cancer Neg Hx    Esophageal cancer Neg Hx     Social History Social History   Tobacco Use   Smoking status: Former    Types: Cigarettes    Quit date: 04/16/1981    Years since quitting: 40.9   Smokeless tobacco: Never  Vaping Use   Vaping Use: Never used  Substance Use Topics   Alcohol use: No   Drug use: No   Allergies   Patient has no known allergies.  Review of Systems Review of Systems Pertinent findings revealed after performing a 14 point review of systems has been noted in the history of present illness.  Physical Exam Triage Vital Signs ED Triage Vitals  Enc Vitals Group     BP 02/10/21 0827 (!) 147/82     Pulse Rate 02/10/21 0827 72     Resp 02/10/21 0827 18     Temp 02/10/21 0827 98.3 F (36.8 C)     Temp Source 02/10/21 0827 Oral     SpO2 02/10/21 0827 98 %     Weight --      Height --      Head Circumference --      Peak Flow --      Pain Score 02/10/21 0826 5     Pain Loc --      Pain Edu? --      Excl. in GC? --    Updated Vital Signs BP 124/82 (BP Location: Left Arm)   Pulse (!) 103   Temp 98.1 F (36.7 C) (Oral)   Resp 16   SpO2 95%   Physical Exam Vitals and nursing note reviewed.  Constitutional:      General: She is not in acute distress.    Appearance: Normal appearance.  HENT:     Head: Normocephalic and atraumatic.  Eyes:     Pupils: Pupils are equal, round, and reactive to light.  Cardiovascular:     Rate and Rhythm: Normal rate and regular rhythm.  Pulmonary:     Effort: Pulmonary effort is normal.     Breath sounds: Normal breath sounds.  Musculoskeletal:     Cervical back: Normal range of motion and neck supple.     Right foot: Decreased range of motion. Normal capillary refill. Swelling, bunion, tenderness and bony tenderness present. No deformity. Normal pulse.     Left foot: Normal.  Skin:    General: Skin is warm and dry.  Neurological:     General: No focal deficit present.     Mental Status: She is alert and oriented to person, place, and time. Mental  status is at baseline.  Psychiatric:        Mood and Affect: Mood normal.        Behavior: Behavior normal.        Thought Content: Thought content normal.  Judgment: Judgment normal.     [facility] Couse / Diagnostics / Procedures:     Radiology DG Foot Complete Right  Result Date: 03/26/2022 CLINICAL DATA:  Pain and swelling after twisting injury and fall EXAM: RIGHT FOOT COMPLETE - 3+ VIEW COMPARISON:  None Available. FINDINGS: Transverse minimally comminuted fracture across the proximal phalanx right great toe, distracted less than 1 mm without significant angulation deformity; no significant fracture involvement of the subchondral cortex. Mild hallux valgus deformity. Orthopedic screws in the second and third metatarsal heads. Calcaneal spur. IMPRESSION: Minimally comminuted fracture, proximal phalanx right great toe. Electronically Signed   By: Corlis Leak  Hassell M.D.   On: 03/26/2022 15:51    Procedures Procedures (including critical care time) EKG  Pending results:  Labs Reviewed - No data to display  Medications Ordered in UC: Medications  ibuprofen (ADVIL) tablet 800 mg (has no administration in time range)    UC Diagnoses / Final Clinical Impressions(s)   I have reviewed the triage vital signs and the nursing notes.  Pertinent labs & imaging results that were available during my care of the patient were reviewed by me and considered in my medical decision making (see chart for details).    Final diagnoses:  Nondisplaced fracture of proximal phalanx of right great toe, initial encounter for closed fracture   Patient provided with ibuprofen 100 mg and a cam boot during her visit today.  Patient advised of x-ray findings.    Please see discharge instructions below for details of plan of care as provided to patient.  Patient states she has an orthopedic provider and will contact them today to let them know that her toe is broken and to see if they have further instructions. ED  Prescriptions     Medication Sig Dispense Auth. Provider   ibuprofen (ADVIL) 800 MG tablet Take 1 tablet (800 mg total) by mouth every 8 (eight) hours as needed for up to 21 doses for fever, headache, mild pain or moderate pain. 21 tablet Theadora RamaMorgan, Sadira Standard Scales, PA-C      PDMP not reviewed this encounter.  Discharge Instructions:   Discharge Instructions      Your right toe is broken.  Please wear the cam boot provided to you today at all times when walking until you can be seen by your orthopedic doctor.  Please contact them today to schedule appointment as soon as possible.  You are provided with ibuprofen 800 mg during your visit today for pain and swelling.  I have also sent a prescription for ibuprofen 800 mg tablets to your pharmacy that you can take every 8 hours as needed for pain and swelling.  When you are not walking, please keep your foot elevated above the level of your heart as much as possible.  You are also welcome to ice your foot is much as possible to help reduce swelling as well.  Thank you for visiting urgent care today.      Disposition Upon Discharge:  Condition: stable for discharge home Home: take medications as prescribed; routine discharge instructions as discussed; follow up as advised.  Patient presented with an acute illness with associated systemic symptoms and significant discomfort requiring urgent management. In my opinion, this is a condition that a prudent lay person (someone who possesses an average knowledge of health and medicine) may potentially expect to result in complications if not addressed urgently such as respiratory distress, impairment of bodily function or dysfunction of bodily organs.   Routine symptom specific, illness  specific and/or disease specific instructions were discussed with the patient and/or caregiver at length.   As such, the patient has been evaluated and assessed, work-up was performed and treatment was provided in  alignment with urgent care protocols and evidence based medicine.  Patient/parent/caregiver has been advised that the patient may require follow up for further testing and treatment if the symptoms continue in spite of treatment, as clinically indicated and appropriate.  Patient/parent/caregiver has been advised to report to orthopedic urgent care clinic or return to the [facility] or PCP in 3-5 days if no better; follow-up with orthopedics, PCP or the Emergency Department if new signs and symptoms develop or if the current signs or symptoms continue to change or worsen for further workup, evaluation and treatment as clinically indicated and appropriate  The patient will follow up with their current PCP if and as advised. If the patient does not currently have a PCP we will have assisted them in obtaining one.   The patient may need specialty follow up if the symptoms continue, in spite of conservative treatment and management, for further workup, evaluation, consultation and treatment as clinically indicated and appropriate.  Patient/parent/caregiver verbalized understanding and agreement of plan as discussed.  All questions were addressed during visit.  Please see discharge instructions below for further details of plan.  This office note has been dictated using Teaching laboratory technician.  Unfortunately, this method of dictation can sometimes lead to typographical or grammatical errors.  I apologize for your inconvenience in advance if this occurs.  Please do not hesitate to reach out to me if clarification is needed.      Theadora Rama Scales, PA-C 03/26/22 1606

## 2022-03-26 NOTE — Discharge Instructions (Addendum)
Your right toe is broken.  Please wear the cam boot provided to you today at all times when walking until you can be seen by your orthopedic doctor.  Please contact them today to schedule appointment as soon as possible.  You were provided with ibuprofen 800 mg during your visit today for pain and swelling.  I have also sent a prescription for ibuprofen 800 mg tablets to your pharmacy that you can take every 8 hours as needed for pain and swelling.  When you are not walking, please keep your foot elevated above the level of your heart as much as possible.  You are also welcome to ice your foot is much as possible to help reduce swelling as well.  Thank you for visiting urgent care today.

## 2022-03-27 ENCOUNTER — Encounter: Payer: Self-pay | Admitting: Physician Assistant

## 2022-03-27 ENCOUNTER — Ambulatory Visit: Payer: 59 | Admitting: Physician Assistant

## 2022-03-27 DIAGNOSIS — S92401B Displaced unspecified fracture of right great toe, initial encounter for open fracture: Secondary | ICD-10-CM

## 2022-03-27 NOTE — Progress Notes (Signed)
Office Visit Note   Patient: Caroline Keller           Date of Birth: May 30, 1960           MRN: [id number] Visit Date: 03/27/2022              Requested by: Jarrett Soho, PA-C [address] [place],  [place] [address] PCP: Jarrett Soho, PA-C  Chief Complaint  Patient presents with   Right Foot - Injury    DOI 03/26/2022      HPI: Ms. Grable is a pleasant 61 year old woman with a chief complaint of right great toe pain.  She missed stepped yesterday and had immediate pain in her great toe.  She denies any other foot pain or ankle pain.  She was seen and evaluated at an urgent care.  She was diagnosed with a proximal phalanx fracture of the great toe.  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  She has been in a tall cam walker boot.   Assessment & Plan: Visit Diagnoses:  1. Open non-physeal fracture of phalanx of right great toe, unspecified phalanx, initial encounter     Plan: Findings consistent with an isolated nondisplaced proximal phalanx fracture of the great toe.  She has good mobility EHL FHL are working.  She has no midfoot pain no concerns for Lisfranc injury.  We discussed transitioning to a postop shoe which given her height may be much more comfortable for her.  Will follow-up in 2 weeks.  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   Ortho Exam  Patient is alert, oriented, no adenopathy, well-dressed, normal affect, normal respiratory effort. Examination of her foot she has ecchymosis focused on the right great toe.  Pulses are palpable sensation is intact she is able to elevate her toe EHL intact and flex her toe.  She focally tender over the proximal phalanx.  She has well-healed or healed surgical scars she does have a hallux valgus deformity  Imaging: DG Foot Complete Right  Result Date:  03/26/2022 CLINICAL DATA:  Pain and swelling after twisting injury and fall EXAM: RIGHT FOOT COMPLETE - 3+ VIEW COMPARISON:  None Available. FINDINGS: Transverse minimally comminuted fracture across the proximal phalanx right great toe, distracted less than 1 mm without significant angulation deformity; no significant fracture involvement of the subchondral cortex. Mild hallux valgus deformity. Orthopedic screws in the second and third metatarsal heads. Calcaneal spur. IMPRESSION: Minimally comminuted fracture, proximal phalanx right great toe. Electronically Signed   By: Corlis Leak M.D.   On: 03/26/2022 15:51   No images are attached to the encounter.  Labs: Lab Results  Component Value Date   REPTSTATUS 09/15/2013 FINAL 09/13/2013   CULT  09/13/2013    No Beta Hemolytic Streptococci Isolated Performed at [facility]     Lab Results  Component Value Date   ALBUMIN 4.7 08/11/2020   ALBUMIN 4.9 06/17/2019   ALBUMIN 4.4 05/28/2018    No results found for: "MG" No results found for: "VD25OH"  No results found for: "PREALBUMIN"    Latest Ref Rng & Units 04/13/2020    4:40 AM 04/06/2020   11:34 AM 06/17/2019   11:28 AM  CBC EXTENDED  WBC 4.0 - 10.5 K/uL 10.7  5.9  6.6   RBC 3.87 - 5.11 MIL/uL 3.29  3.92  3.99   Hemoglobin 12.0 - 15.0 g/dL 43.1  54.0  08.6   HCT 36.0 - 46.0 % 33.7  39.0  37.0   Platelets 150 - 400 K/uL 228  235  262      There is no height or weight on file to calculate BMI.  Orders:  No orders of the defined types were placed in this encounter.  No orders of the defined types were placed in this encounter.    Procedures: No procedures performed  Clinical Data: No additional findings.  ROS:  All other systems negative, except as noted in the HPI. Review of Systems  Objective: Vital Signs: There were no vitals taken for this visit.  Specialty Comments:  No specialty comments available.  PMFS History: Patient Active Problem List    Diagnosis Date Noted   GERD (gastroesophageal reflux disease) 01/17/2021   Status post total left knee replacement 04/12/2020   Unilateral primary osteoarthritis, left knee 02/02/2019   S/P arthroscopy of left knee 11/06/2018   Unilateral primary osteoarthritis, right knee 03/19/2018   Hyperlipidemia 02/25/2017   Chronic left-sided low back pain with left-sided sciatica 09/13/2016   Trigger thumb, left thumb 09/13/2016   Trigger thumb, right thumb 09/13/2016   Substernal chest pain 01/03/2015   PVC's (premature ventricular contractions) 03/23/2014   PAC (premature atrial contraction) 03/23/2014   Benign essential HTN 03/23/2014   Hepatitis C 12/08/2013   Past Medical History:  Diagnosis Date   Anxiety    Arthritis of low back    GERD (gastroesophageal reflux disease)    Hepatitis C    History of C-section    History of cholecystectomy    History of hiatal hernia    Hyperlipidemia    Hypertension    PAC (premature atrial contraction)    Palpitations    PONV (postoperative nausea and vomiting)    PVC (premature ventricular contraction)     Family History  Problem Relation Age of Onset   Lung cancer Mother    Coronary artery disease Father    Breast cancer Sister    Colon cancer Neg Hx    Rectal cancer Neg Hx    Stomach cancer Neg Hx    Pancreatic cancer Neg Hx    Esophageal cancer Neg Hx     Past Surgical History:  Procedure Laterality Date   CARDIOVASCULAR STRESS TEST  01/04/2015   CESAREAN SECTION     CHOLECYSTECTOMY     KNEE SURGERY  10/2018   TOTAL KNEE ARTHROPLASTY Left 04/12/2020   Procedure: LEFT TOTAL KNEE ARTHROPLASTY;  Surgeon: Kathryne Hitch, MD;  Location: [facility] OR;  Service: Orthopedics;  Laterality: Left;  RNFA   Social History   Occupational History   Not on file  Tobacco Use   Smoking status: Former    Types: Cigarettes    Quit date: 04/16/1981    Years since quitting: 40.9   Smokeless tobacco: Never  Vaping Use   Vaping Use: Never used   Substance and Sexual Activity   Alcohol use: No   Drug use: No   Sexual activity: Not on file

## 2022-04-12 ENCOUNTER — Ambulatory Visit: Payer: 59 | Admitting: Physician Assistant

## 2022-04-12 ENCOUNTER — Encounter: Payer: Self-pay | Admitting: Physician Assistant

## 2022-04-12 ENCOUNTER — Encounter: Payer: Self-pay | Admitting: Orthopaedic Surgery

## 2022-04-12 ENCOUNTER — Ambulatory Visit: Payer: Self-pay

## 2022-04-12 ENCOUNTER — Ambulatory Visit (INDEPENDENT_AMBULATORY_CARE_PROVIDER_SITE_OTHER): Payer: 59 | Admitting: Orthopaedic Surgery

## 2022-04-12 ENCOUNTER — Ambulatory Visit (INDEPENDENT_AMBULATORY_CARE_PROVIDER_SITE_OTHER): Payer: 59

## 2022-04-12 VITALS — Ht 64.0 in | Wt 203.0 lb

## 2022-04-12 DIAGNOSIS — S92401B Displaced unspecified fracture of right great toe, initial encounter for open fracture: Secondary | ICD-10-CM

## 2022-04-12 DIAGNOSIS — S92401A Displaced unspecified fracture of right great toe, initial encounter for closed fracture: Secondary | ICD-10-CM

## 2022-04-12 NOTE — Progress Notes (Signed)
Office Visit Note   Patient: Caroline Keller           Date of Birth: Oct 31, 1960           MRN: [id number] Visit Date: 04/12/2022              Requested by: Jarrett Soho, PA-C [address] [place],  [place] [address] PCP: Jarrett Soho, PA-C   Assessment & Plan: Visit Diagnoses:  1. Closed displaced fracture of phalanx of right great toe, unspecified phalanx, initial encounter     Plan: Caroline Keller was seen by Clerance Lav this morning for follow-up evaluation of the right great toe proximal phalanx fracture.  There is significant comminution but normal alignment on the AP view.  On the lateral view there is extension of the distal fragment.  I asked her to come back in this afternoon and after numbing the toe with 1% Xylocaine I reduce the fracture and then taped it to the second toe with gauze in between.  Follow-up films appear to demonstrate improvement in the position.  Clinically that the toe seems nicely aligned.  Should continue nonworking and follow-up in 2 weeks.  Continue with taping of the great to the second toe and the wooden shoe  Follow-Up Instructions: Return in about 2 weeks (around 04/26/2022).   Orders:  Orders Placed This Encounter  Procedures   XR Toe Great Right   No orders of the defined types were placed in this encounter.     Procedures: No procedures performed   Clinical Data: No additional findings.   Subjective: Chief Complaint  Patient presents with   Right Foot - Pain    DOI 03/25/2022  Patient presents for possible reduction right great toe fracture. She is wearing post op shoe.   HPI  Review of Systems   Objective: Vital Signs: Ht 5\' 4"  (1.626 m)   Wt 203 lb (92.1 kg)   BMI 34.84 kg/m   Physical Exam  Ortho Exam right great toe was not hot red warm or swollen.  No edema.  Neurologically intact.  There is a bunion but nontender and there is a hallux valgus.  Clinically there was just minimal extension across the fracture  site. I prepped the toe with alcohol Betadine injected 1% Xylocaine on either the side and dorsally at the base of the great toe and after the toe was anesthetized I performed a reduction and then taped the great to the second toe and follow-up films demonstrated improvement in position  Specialty Comments:  No specialty comments available.  Imaging: XR Toe Great Right  Result Date: 04/12/2022 Films of the right great toe were obtained in 3 projections after close manipulation.  I think that there is been an improvement in the extension position of the fracture on the lateral film.  It appears to be anatomic alignment on the AP  XR Toe Great Right  Result Date: 04/12/2022 Radiographs of her right great toe reviewed today.  Dedicated x-rays of the toe were taken.  Comminuted fracture of proximal phalanx of the great toe.  On oblique view does appear to have some displacement and shortening.    PMFS History: Patient Active Problem List   Diagnosis Date Noted   Closed fracture of phalanx of right great toe 04/12/2022   GERD (gastroesophageal reflux disease) 01/17/2021   Status post total left knee replacement 04/12/2020   Unilateral primary osteoarthritis, left knee 02/02/2019   S/P arthroscopy of left knee 11/06/2018   Unilateral primary  osteoarthritis, right knee 03/19/2018   Hyperlipidemia 02/25/2017   Chronic left-sided low back pain with left-sided sciatica 09/13/2016   Trigger thumb, left thumb 09/13/2016   Trigger thumb, right thumb 09/13/2016   Substernal chest pain 01/03/2015   PVC's (premature ventricular contractions) 03/23/2014   PAC (premature atrial contraction) 03/23/2014   Benign essential HTN 03/23/2014   Hepatitis C 12/08/2013   Past Medical History:  Diagnosis Date   Anxiety    Arthritis of low back    GERD (gastroesophageal reflux disease)    Hepatitis C    History of C-section    History of cholecystectomy    History of hiatal hernia    Hyperlipidemia     Hypertension    PAC (premature atrial contraction)    Palpitations    PONV (postoperative nausea and vomiting)    PVC (premature ventricular contraction)     Family History  Problem Relation Age of Onset   Lung cancer Mother    Coronary artery disease Father    Breast cancer Sister    Colon cancer Neg Hx    Rectal cancer Neg Hx    Stomach cancer Neg Hx    Pancreatic cancer Neg Hx    Esophageal cancer Neg Hx     Past Surgical History:  Procedure Laterality Date   CARDIOVASCULAR STRESS TEST  01/04/2015   CESAREAN SECTION     CHOLECYSTECTOMY     KNEE SURGERY  10/2018   TOTAL KNEE ARTHROPLASTY Left 04/12/2020   Procedure: LEFT TOTAL KNEE ARTHROPLASTY;  Surgeon: Kathryne Hitch, MD;  Location: [facility] OR;  Service: Orthopedics;  Laterality: Left;  RNFA   Social History   Occupational History   Not on file  Tobacco Use   Smoking status: Former    Types: Cigarettes    Quit date: 04/16/1981    Years since quitting: 41.0   Smokeless tobacco: Never  Vaping Use   Vaping Use: Never used  Substance and Sexual Activity   Alcohol use: No   Drug use: No   Sexual activity: Not on file

## 2022-04-12 NOTE — Progress Notes (Signed)
Office Visit Note   Patient: Caroline Keller           Date of Birth: [birth year]           MRN: [id number] Visit Date: 04/12/2022              Requested by: Jarrett Soho, PA-C [address] [place],  [place] [address] PCP: Jarrett Soho, PA-C  Chief Complaint  Patient presents with   Right Foot - Follow-up      HPI: Ms. Sarver is a pleasant 61 year old woman who is now 18 days status post missing a step and injuring her right great toe.  She has been in a postop shoe and is here for follow-up.  She says it is feeling a little bit better.  She is able to wiggle her toes.  Assessment & Plan: Visit Diagnoses:  Right great toe fracture  Plan: I am going to review her x-rays with one of our physicians.  Dedicated x-rays of the great toe do demonstrate some displacement.  Her EHL and flexion function is weak but intact.  She is neurovascularly intact.  Follow-Up Instructions: Return in about 2 weeks (around 04/26/2022).   Ortho Exam  Patient is alert, oriented, no adenopathy, well-dressed, normal affect, normal respiratory effort. Examination she has well-healed previous surgery scars from her osteotomies.  She does have a hallux valgus deformity.  She does have some resolving ecchymosis over the proximal phalanx.  Is able to weakly extend and flex her toe strong dorsalis pedis pulse  Imaging: XR Toe Great Right  Result Date: 04/12/2022 Radiographs of her right great toe reviewed today.  Dedicated x-rays of the toe were taken.  Comminuted fracture of proximal phalanx of the great toe.  On oblique view does appear to have some displacement and shortening.  No images are attached to the encounter.  Labs: Lab Results  Component Value Date   REPTSTATUS 09/15/2013 FINAL 09/13/2013   CULT  09/13/2013    No Beta Hemolytic Streptococci Isolated Performed at [facility]     Lab Results  Component Value Date   ALBUMIN 4.7 08/11/2020   ALBUMIN 4.9 06/17/2019    ALBUMIN 4.4 05/28/2018    No results found for: "MG" No results found for: "VD25OH"  No results found for: "PREALBUMIN"    Latest Ref Rng & Units 04/13/2020    4:40 AM 04/06/2020   11:34 AM 06/17/2019   11:28 AM  CBC EXTENDED  WBC 4.0 - 10.5 K/uL 10.7  5.9  6.6   RBC 3.87 - 5.11 MIL/uL 3.29  3.92  3.99   Hemoglobin 12.0 - 15.0 g/dL 84.6  65.9  93.5   HCT 36.0 - 46.0 % 33.7  39.0  37.0   Platelets 150 - 400 K/uL 228  235  262      There is no height or weight on file to calculate BMI.  Orders:  Orders Placed This Encounter  Procedures   XR Toe Great Right   No orders of the defined types were placed in this encounter.    Procedures: No procedures performed  Clinical Data: No additional findings.  ROS:  All other systems negative, except as noted in the HPI. Review of Systems  Objective: Vital Signs: There were no vitals taken for this visit.  Specialty Comments:  No specialty comments available.  PMFS History: Patient Active Problem List   Diagnosis Date Noted   Closed fracture of phalanx of right great toe 04/12/2022   GERD (  gastroesophageal reflux disease) 01/17/2021   Status post total left knee replacement 04/12/2020   Unilateral primary osteoarthritis, left knee 02/02/2019   S/P arthroscopy of left knee 11/06/2018   Unilateral primary osteoarthritis, right knee 03/19/2018   Hyperlipidemia 02/25/2017   Chronic left-sided low back pain with left-sided sciatica 09/13/2016   Trigger thumb, left thumb 09/13/2016   Trigger thumb, right thumb 09/13/2016   Substernal chest pain 01/03/2015   PVC's (premature ventricular contractions) 03/23/2014   PAC (premature atrial contraction) 03/23/2014   Benign essential HTN 03/23/2014   Hepatitis C 12/08/2013   Past Medical History:  Diagnosis Date   Anxiety    Arthritis of low back    GERD (gastroesophageal reflux disease)    Hepatitis C    History of C-section    History of cholecystectomy    History of  hiatal hernia    Hyperlipidemia    Hypertension    PAC (premature atrial contraction)    Palpitations    PONV (postoperative nausea and vomiting)    PVC (premature ventricular contraction)     Family History  Problem Relation Age of Onset   Lung cancer Mother    Coronary artery disease Father    Breast cancer Sister    Colon cancer Neg Hx    Rectal cancer Neg Hx    Stomach cancer Neg Hx    Pancreatic cancer Neg Hx    Esophageal cancer Neg Hx     Past Surgical History:  Procedure Laterality Date   CARDIOVASCULAR STRESS TEST  01/04/2015   CESAREAN SECTION     CHOLECYSTECTOMY     KNEE SURGERY  10/2018   TOTAL KNEE ARTHROPLASTY Left 04/12/2020   Procedure: LEFT TOTAL KNEE ARTHROPLASTY;  Surgeon: Kathryne Hitch, MD;  Location: [facility] OR;  Service: Orthopedics;  Laterality: Left;  RNFA   Social History   Occupational History   Not on file  Tobacco Use   Smoking status: Former    Types: Cigarettes    Quit date: 04/16/1981    Years since quitting: 41.0   Smokeless tobacco: Never  Vaping Use   Vaping Use: Never used  Substance and Sexual Activity   Alcohol use: No   Drug use: No   Sexual activity: Not on file

## 2022-04-12 NOTE — Addendum Note (Signed)
Addended by: Polly Cobia on: 04/12/2022 12:52 PM   Modules accepted: Level of Service

## 2022-04-26 ENCOUNTER — Ambulatory Visit: Payer: 59 | Admitting: Physician Assistant

## 2022-04-27 ENCOUNTER — Ambulatory Visit: Payer: 59 | Admitting: Physician Assistant

## 2022-05-02 ENCOUNTER — Ambulatory Visit: Payer: 59 | Admitting: Orthopaedic Surgery

## 2022-05-02 ENCOUNTER — Encounter: Payer: Self-pay | Admitting: Orthopaedic Surgery

## 2022-05-02 DIAGNOSIS — M25511 Pain in right shoulder: Secondary | ICD-10-CM

## 2022-05-02 DIAGNOSIS — G8929 Other chronic pain: Secondary | ICD-10-CM

## 2022-05-02 DIAGNOSIS — M25512 Pain in left shoulder: Secondary | ICD-10-CM | POA: Diagnosis not present

## 2022-05-02 MED ORDER — METHYLPREDNISOLONE ACETATE 40 MG/ML IJ SUSP
40.0000 mg | INTRAMUSCULAR | Status: AC | PRN
  Administered 2022-05-02: 40 mg via INTRA_ARTICULAR

## 2022-05-02 MED ORDER — LIDOCAINE HCL 1 % IJ SOLN
3.0000 mL | INTRAMUSCULAR | Status: AC | PRN
  Administered 2022-05-02: 3 mL

## 2022-05-02 NOTE — Progress Notes (Signed)
The patient is well-known to me.  I have seen her for multiple orthopedic issues and most recently from my standpoint it has been dealing with bilateral shoulder pain and impingement.  We did obtain an MRI late last year with her left shoulder and it did show partial-thickness rotator cuff tear and definitely evidence of impingement.  We did end up recommending an arthroscopic intervention and she wanted to wait until after the first of the year.  Since then her right shoulder is hurting her and her left shoulder and she has been dealing with a fracture of her right great toe that has been treated appropriately.  She comes in today requesting injections in both her shoulders.  She states she hurts extensively with overhead activities and other functions with the shoulder.  She will schedule for surgery at the moment and since she is not a diabetic I agree with injections of both shoulders.  On exam both shoulders have good mobility but there is definitely pain past 90 degrees of abduction of both shoulders as well as forward flexion.  Both shoulders show signs of impingement with some slight weakness.  I did place a steroid injection in both shoulder subacromial outlets which she tolerated well.  At this point this is the third injection in each shoulder and we have an MRI of the left shoulder now we need MRI of the right shoulder to assess the rotator cuff.  She agrees with this treatment plan.  We will see her in follow-up once we have the MRI of her right shoulder.      Procedure Note  Patient: Caroline Keller             Date of Birth: Sep 02, 1960           MRN: [id number]             Visit Date: 05/02/2022  Procedures: Visit Diagnoses:  1. Chronic left shoulder pain   2. Chronic right shoulder pain     Large Joint Inj: R subacromial bursa on 05/02/2022 4:34 PM Indications: pain and diagnostic evaluation Details: 22 G 1.5 in needle  Arthrogram: No  Medications: 3 mL lidocaine 1 %; 40 mg  methylPREDNISolone acetate 40 MG/ML Outcome: tolerated well, no immediate complications Procedure, treatment alternatives, risks and benefits explained, specific risks discussed. Consent was given by the patient. Immediately prior to procedure a time out was called to verify the correct patient, procedure, equipment, support staff and site/side marked as required. Patient was prepped and draped in the usual sterile fashion.    Large Joint Inj: L subacromial bursa on 05/02/2022 4:34 PM Indications: pain and diagnostic evaluation Details: 22 G 1.5 in needle  Arthrogram: No  Medications: 3 mL lidocaine 1 %; 40 mg methylPREDNISolone acetate 40 MG/ML Outcome: tolerated well, no immediate complications Procedure, treatment alternatives, risks and benefits explained, specific risks discussed. Consent was given by the patient. Immediately prior to procedure a time out was called to verify the correct patient, procedure, equipment, support staff and site/side marked as required. Patient was prepped and draped in the usual sterile fashion.

## 2022-05-03 ENCOUNTER — Other Ambulatory Visit: Payer: Self-pay

## 2022-05-03 DIAGNOSIS — G8929 Other chronic pain: Secondary | ICD-10-CM

## 2022-05-23 DIAGNOSIS — E785 Hyperlipidemia, unspecified: Secondary | ICD-10-CM | POA: Diagnosis not present

## 2022-05-23 DIAGNOSIS — I10 Essential (primary) hypertension: Secondary | ICD-10-CM | POA: Diagnosis not present

## 2022-06-04 ENCOUNTER — Ambulatory Visit
Admission: RE | Admit: 2022-06-04 | Discharge: 2022-06-04 | Disposition: A | Discharge status: Home / Self Care | Payer: 59 | Source: Ambulatory Visit | Attending: Orthopaedic Surgery | Admitting: Orthopaedic Surgery

## 2022-06-04 DIAGNOSIS — M25511 Pain in right shoulder: Secondary | ICD-10-CM | POA: Diagnosis not present

## 2022-06-04 DIAGNOSIS — G8929 Other chronic pain: Secondary | ICD-10-CM

## 2022-06-06 ENCOUNTER — Ambulatory Visit: Payer: 59 | Admitting: Orthopaedic Surgery

## 2022-06-06 ENCOUNTER — Encounter: Payer: Self-pay | Admitting: Orthopaedic Surgery

## 2022-06-06 DIAGNOSIS — M25511 Pain in right shoulder: Secondary | ICD-10-CM

## 2022-06-06 DIAGNOSIS — M75121 Complete rotator cuff tear or rupture of right shoulder, not specified as traumatic: Secondary | ICD-10-CM | POA: Diagnosis not present

## 2022-06-06 DIAGNOSIS — G8929 Other chronic pain: Secondary | ICD-10-CM

## 2022-06-06 NOTE — Progress Notes (Signed)
The patient comes in today to go over an MRI of her right shoulder.  She has been having worsening pain and weakness with that right shoulder and had failed conservative treatment including anti-inflammatories, steroid injections and rest as well as therapy.  On exam her right shoulder shows definite deficiency of the rotator cuff with weakness and pain with decreased motion as well.  She is using more of her deltoid to abduct her shoulder.  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  There is also some intra-articular tearing of the biceps tendon and a ganglion cyst along the subscapularis tendon.  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  Given the findings of her right shoulder MRI combined with her clinical exam, we are recommending an right shoulder arthroscopy with extensive debridement, subacromial decompression and an arthroscopically assisted rotator cuff repair.  We explained in detail what the surgery involves.  I showed a shoulder model and we talked about the risks and benefits of the surgery.  Postoperatively she will likely need to be out of work at least 6 to 8 weeks while she recovers and participates in therapy on her right shoulder.  All questions and concerns were answered and addressed.  We will work on getting this scheduled and she agrees with this as well.

## 2022-06-18 ENCOUNTER — Encounter: Payer: Self-pay | Admitting: Orthopaedic Surgery

## 2022-06-18 ENCOUNTER — Encounter (HOSPITAL_BASED_OUTPATIENT_CLINIC_OR_DEPARTMENT_OTHER): Payer: Self-pay | Admitting: Cardiology

## 2022-06-19 ENCOUNTER — Telehealth: Payer: Self-pay

## 2022-06-19 DIAGNOSIS — E782 Mixed hyperlipidemia: Secondary | ICD-10-CM

## 2022-06-19 MED ORDER — ATORVASTATIN CALCIUM 20 MG PO TABS: 20.0000 mg | ORAL_TABLET | Freq: Every day | ORAL | 3 refills | Status: DC

## 2022-06-19 NOTE — Telephone Encounter (Signed)
Called and left detailed message per DPR that I would call in prescription for 20 mg atorvastatin per Dr. Johney Frame and Dr. Oralia Rud recommendations. Orders  placed to patient's preferred pharmacy.

## 2022-08-02 ENCOUNTER — Ambulatory Visit: Payer: 59 | Admitting: Orthopaedic Surgery

## 2022-08-02 DIAGNOSIS — M25511 Pain in right shoulder: Secondary | ICD-10-CM

## 2022-08-02 DIAGNOSIS — M25512 Pain in left shoulder: Secondary | ICD-10-CM | POA: Diagnosis not present

## 2022-08-02 DIAGNOSIS — G8929 Other chronic pain: Secondary | ICD-10-CM

## 2022-08-02 MED ORDER — LIDOCAINE HCL 1 % IJ SOLN
3.0000 mL | INTRAMUSCULAR | Status: AC | PRN
  Administered 2022-08-02: 3 mL

## 2022-08-02 MED ORDER — METHYLPREDNISOLONE ACETATE 40 MG/ML IJ SUSP
40.0000 mg | INTRAMUSCULAR | Status: AC | PRN
  Administered 2022-08-02: 40 mg via INTRA_ARTICULAR

## 2022-08-02 NOTE — Progress Notes (Signed)
The patient is a 62 year old female well-known to me.  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  She has lost her husband.  She is taking some time off from work as well and trying to rest her shoulders.  Her left 1 is hurting worse than the right 1 today and she is able to lift her right 1 above her head and this is better than it was before.  She is requesting a steroid injection in both shoulders today.  She is not a diabetic either.  I did assess both shoulders today and felt it was reasonable to try subacromial steroid injection of both shoulders which she tolerated well.  I am happy to proceed with right shoulder surgery at some point when she would like to have this done.  She knows to wait at least 3 months between steroid injections.     Procedure Note  Patient: Caroline Keller             Date of Birth: 03-20-1961           MRN: [id number]             Visit Date: 08/02/2022  Procedures: Visit Diagnoses:  1. Chronic right shoulder pain   2. Chronic left shoulder pain     Large Joint Inj: R subacromial bursa on 08/02/2022 3:15 PM Indications: pain and diagnostic evaluation Details: 22 G 1.5 in needle  Arthrogram: No  Medications: 3 mL lidocaine 1 %; 40 mg methylPREDNISolone acetate 40 MG/ML Outcome: tolerated well, no immediate complications Procedure, treatment alternatives, risks and benefits explained, specific risks discussed. Consent was given by the patient. Immediately prior to procedure a time out was called to verify the correct patient, procedure, equipment, support staff and site/side marked as required. Patient was prepped and draped in the usual sterile fashion.    Large Joint Inj: L subacromial bursa on 08/02/2022 3:15 PM Indications: pain and diagnostic evaluation Details: 22 G 1.5 in needle  Arthrogram: No  Medications: 3 mL lidocaine 1 %; 40 mg  methylPREDNISolone acetate 40 MG/ML Outcome: tolerated well, no immediate complications Procedure, treatment alternatives, risks and benefits explained, specific risks discussed. Consent was given by the patient. Immediately prior to procedure a time out was called to verify the correct patient, procedure, equipment, support staff and site/side marked as required. Patient was prepped and draped in the usual sterile fashion.

## 2022-09-06 ENCOUNTER — Encounter: Payer: Self-pay | Admitting: Physician Assistant

## 2022-10-26 ENCOUNTER — Other Ambulatory Visit: Payer: Self-pay | Admitting: Cardiology

## 2022-10-30 DIAGNOSIS — H10021 Other mucopurulent conjunctivitis, right eye: Secondary | ICD-10-CM | POA: Diagnosis not present

## 2022-10-30 DIAGNOSIS — Z6834 Body mass index (BMI) 34.0-34.9, adult: Secondary | ICD-10-CM | POA: Diagnosis not present

## 2022-11-05 ENCOUNTER — Ambulatory Visit: Payer: 59 | Admitting: Orthopaedic Surgery

## 2022-11-05 ENCOUNTER — Encounter: Payer: Self-pay | Admitting: Orthopaedic Surgery

## 2022-11-05 DIAGNOSIS — G8929 Other chronic pain: Secondary | ICD-10-CM | POA: Diagnosis not present

## 2022-11-05 DIAGNOSIS — M25512 Pain in left shoulder: Secondary | ICD-10-CM | POA: Diagnosis not present

## 2022-11-05 DIAGNOSIS — M25511 Pain in right shoulder: Secondary | ICD-10-CM | POA: Diagnosis not present

## 2022-11-05 MED ORDER — LIDOCAINE HCL 1 % IJ SOLN
3.0000 mL | INTRAMUSCULAR | Status: AC | PRN
  Administered 2022-11-05: 3 mL

## 2022-11-05 MED ORDER — METHYLPREDNISOLONE ACETATE 40 MG/ML IJ SUSP
40.0000 mg | INTRAMUSCULAR | Status: AC | PRN
  Administered 2022-11-05: 40 mg via INTRA_ARTICULAR

## 2022-11-05 NOTE — Progress Notes (Signed)
The patient comes in today requesting a steroid injection in both of her shoulders.  She has chronic bilateral shoulder pain and we have recommended arthroscopic surgery in the past.  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  She is an active 62 year old female.  We last play steroid injections in both shoulders just over 3 months ago.  She says they do last for a while.  Her left today is hurting worse than the right.  Both shoulders have good range of motion but a lot of guarding throughout the arc of motion of both shoulders.  There is no blocks to rotation and there is weakness with giveaway pain.  I did place a steroid injection in both shoulder subacromial outlets which she tolerated well.  She does not have high blood glucose levels.  She knows to wait at least 3 months between injections.  We can also scope her shoulders at some point if he gets to where conservative treatment is failing for her.  We have recommended these in the past.    Procedure Note  Patient: Caroline Keller             Date of Birth: 1960-11-26           MRN: [id number]             Visit Date: 11/05/2022  Procedures: Visit Diagnoses:  1. Chronic right shoulder pain   2. Chronic left shoulder pain     Large Joint Inj: R subacromial bursa on 11/05/2022 10:04 AM Indications: pain and diagnostic evaluation Details: 22 G 1.5 in needle  Arthrogram: No  Medications: 3 mL lidocaine 1 %; 40 mg methylPREDNISolone acetate 40 MG/ML Outcome: tolerated well, no immediate complications Procedure, treatment alternatives, risks and benefits explained, specific risks discussed. Consent was given by the patient. Immediately prior to procedure a time out was called to verify the correct patient, procedure, equipment, support staff and site/side marked as required. Patient was prepped and draped in the usual sterile fashion.    Large Joint Inj: L subacromial bursa on 11/05/2022 10:04 AM Indications:  pain and diagnostic evaluation Details: 22 G 1.5 in needle  Arthrogram: No  Medications: 3 mL lidocaine 1 %; 40 mg methylPREDNISolone acetate 40 MG/ML Outcome: tolerated well, no immediate complications Procedure, treatment alternatives, risks and benefits explained, specific risks discussed. Consent was given by the patient. Immediately prior to procedure a time out was called to verify the correct patient, procedure, equipment, support staff and site/side marked as required. Patient was prepped and draped in the usual sterile fashion.

## 2022-11-06 ENCOUNTER — Other Ambulatory Visit: Payer: Self-pay | Admitting: Cardiology

## 2022-11-19 ENCOUNTER — Ambulatory Visit: Payer: 59 | Admitting: Physician Assistant

## 2022-12-03 ENCOUNTER — Ambulatory Visit: Payer: 59 | Admitting: Orthopaedic Surgery

## 2022-12-05 ENCOUNTER — Other Ambulatory Visit: Payer: Self-pay | Admitting: Cardiology

## 2022-12-18 ENCOUNTER — Other Ambulatory Visit: Payer: Self-pay | Admitting: Cardiology

## 2022-12-26 ENCOUNTER — Other Ambulatory Visit: Payer: Self-pay | Admitting: Cardiology

## 2023-01-27 ENCOUNTER — Other Ambulatory Visit: Payer: Self-pay | Admitting: Cardiology

## 2023-01-31 ENCOUNTER — Other Ambulatory Visit: Payer: Self-pay | Admitting: Cardiology

## 2023-02-04 NOTE — Telephone Encounter (Signed)
Called to offer patient an appointment, no answer. Left detailed message per DPR explaining that she needs to make an appointment before micardis can be refilled.

## 2023-02-07 NOTE — Telephone Encounter (Signed)
Call to patient to explain she needs to be seen before we can refill her Telmisartan. Booked patient an appointment for 05/02/22. Patient states she has 20 days worth of medication left, asking if she can have a 90 day supply to make it to her appointment in January. Forwarded to Dr. Mayford Knife.

## 2023-02-08 ENCOUNTER — Other Ambulatory Visit: Payer: Self-pay | Admitting: Cardiology

## 2023-02-11 MED ORDER — TELMISARTAN-HCTZ 80-25 MG PO TABS: 1.0000 | ORAL_TABLET | Freq: Every day | ORAL | 3 refills | Status: DC

## 2023-02-11 NOTE — Addendum Note (Signed)
Addended by: Luellen Pucker on: 02/11/2023 08:50 AM   Modules accepted: Orders

## 2023-02-11 NOTE — Telephone Encounter (Signed)
Refilled per Dr. Mayford Knife, patient has appt in Jan. 2025.

## 2023-03-20 ENCOUNTER — Ambulatory Visit: Payer: 59 | Admitting: Orthopaedic Surgery

## 2023-03-20 DIAGNOSIS — M25512 Pain in left shoulder: Secondary | ICD-10-CM

## 2023-03-20 DIAGNOSIS — G8929 Other chronic pain: Secondary | ICD-10-CM

## 2023-03-20 DIAGNOSIS — M25511 Pain in right shoulder: Secondary | ICD-10-CM | POA: Diagnosis not present

## 2023-03-20 MED ORDER — LIDOCAINE HCL 1 % IJ SOLN
3.0000 mL | INTRAMUSCULAR | Status: AC | PRN
  Administered 2023-03-20: 3 mL

## 2023-03-20 MED ORDER — METHYLPREDNISOLONE ACETATE 40 MG/ML IJ SUSP
40.0000 mg | INTRAMUSCULAR | Status: AC | PRN
  Administered 2023-03-20: 40 mg via INTRA_ARTICULAR

## 2023-03-20 NOTE — Progress Notes (Signed)
The patient is well-known to [place].  She has chronic bilateral shoulder impingement.  She gets steroid injections in her shoulders about every 3 to 4 months.  She has had no acute change in her medical status.  She would like to have steroid injections in both shoulders today.  It has been at least since July I think when we placed injections in both of her shoulders.  Most of her pain is with overhead activities and reaching behind her.  On exam both shoulders show signs of impingement and pain.  I did place a steroid injection in both shoulder subacromial outlets which she tolerated well.  She knows to wait at least 3 to 4 months before repeat injections.    Procedure Note  Patient: Caroline Keller             Date of Birth: 1960-05-08           MRN: [id number]             Visit Date: 03/20/2023  Procedures: Visit Diagnoses:  1. Chronic right shoulder pain   2. Chronic left shoulder pain     Large Joint Inj: R subacromial bursa on 03/20/2023 11:40 AM Indications: pain and diagnostic evaluation Details: 22 G 1.5 in needle  Arthrogram: No  Medications: 3 mL lidocaine 1 %; 40 mg methylPREDNISolone acetate 40 MG/ML Outcome: tolerated well, no immediate complications Procedure, treatment alternatives, risks and benefits explained, specific risks discussed. Consent was given by the patient. Immediately prior to procedure a time out was called to verify the correct patient, procedure, equipment, support staff and site/side marked as required. Patient was prepped and draped in the usual sterile fashion.    Large Joint Inj: L subacromial bursa on 03/20/2023 11:40 AM Indications: pain and diagnostic evaluation Details: 22 G 1.5 in needle  Arthrogram: No  Medications: 3 mL lidocaine 1 %; 40 mg methylPREDNISolone acetate 40 MG/ML Outcome: tolerated well, no immediate complications Procedure, treatment alternatives, risks and benefits explained, specific risks discussed. Consent was given by the  patient. Immediately prior to procedure a time out was called to verify the correct patient, procedure, equipment, support staff and site/side marked as required. Patient was prepped and draped in the usual sterile fashion.

## 2023-03-27 ENCOUNTER — Ambulatory Visit: Payer: 59 | Admitting: Orthopaedic Surgery

## 2023-04-28 ENCOUNTER — Other Ambulatory Visit: Payer: Self-pay | Admitting: Cardiology

## 2023-05-03 ENCOUNTER — Ambulatory Visit: Payer: 59 | Attending: Cardiology | Admitting: Cardiology

## 2023-05-03 ENCOUNTER — Encounter: Payer: Self-pay | Admitting: Cardiology

## 2023-05-03 VITALS — BP 128/82 | HR 83 | Ht 65.0 in | Wt 200.0 lb

## 2023-05-03 DIAGNOSIS — I493 Ventricular premature depolarization: Secondary | ICD-10-CM | POA: Diagnosis not present

## 2023-05-03 DIAGNOSIS — I10 Essential (primary) hypertension: Secondary | ICD-10-CM

## 2023-05-03 DIAGNOSIS — E782 Mixed hyperlipidemia: Secondary | ICD-10-CM

## 2023-05-03 NOTE — Addendum Note (Signed)
Addended by: Franchot Gallo on: 05/03/2023 03:25 PM   Modules accepted: Orders

## 2023-05-03 NOTE — Patient Instructions (Signed)
Medication Instructions:  Your physician recommends that you continue on your current medications as directed. Please refer to the Current Medication list given to you today.  *If you need a refill on your cardiac medications before your next appointment, please call your pharmacy*  Lab Work: TODAY: CMET, Lipid panel If you have labs (blood work) drawn today and your tests are completely normal, you will receive your results only by: MyChart Message (if you have MyChart) OR A paper copy in the mail If you have any lab test that is abnormal or we need to change your treatment, we will call you to review the results.  Testing/Procedures: None ordered today.  Follow-Up: At [facility], you and your health needs are our priority.  As part of our continuing mission to provide you with exceptional heart care, we have created designated Provider Care Teams.  These Care Teams include your primary Cardiologist (physician) and Advanced Practice Providers (APPs -  Physician Assistants and Nurse Practitioners) who all work together to provide you with the care you need, when you need it.  Your next appointment:   As needed  The format for your next appointment:   In Person  Provider:   Armanda Magic, MD {

## 2023-05-03 NOTE — Progress Notes (Signed)
Cardiology Office Note:    Date:  05/03/2023   ID:  Caroline Keller, DOB Jul 01, 1960, MRN [id number]  PCP:  Jarrett Soho, PA-C  Cardiologist:  Armanda Magic, MD    Referring MD: Jarrett Soho, PA-C   Chief Complaint  Patient presents with   Hypertension   Hyperlipidemia   Palpitations    History of Present Illness:    Caroline Keller is a 63 y.o. female with a hx of HTN, PVC/PACs, HLD, treated hepatitis C, hiatal hernia . She has history of palpitations with stress/anxiety which have been treated with diltiazem. Nuc in 2016 was normal, EF 59%. She saw Arelia Longest NP 02/2018 for routine follow-up and it was noted that she had both high BP and high cholesterol. Micardis was increased to 80/12.5mg  daily and she was started on atorastatin 20mg .     She is here today for followup and is doing well.  She denies any chest pain or pressure, SOB, DOE, PND, orthopnea, LE edema, dizziness, palpitations or syncope. She is compliant with her meds and is tolerating meds with no SE.    Past Medical History:  Diagnosis Date   Anxiety    Arthritis of low back    GERD (gastroesophageal reflux disease)    Hepatitis C    History of C-section    History of cholecystectomy    History of hiatal hernia    Hyperlipidemia    Hypertension    PAC (premature atrial contraction)    Palpitations    PONV (postoperative nausea and vomiting)    PVC (premature ventricular contraction)     Past Surgical History:  Procedure Laterality Date   CARDIOVASCULAR STRESS TEST  01/04/2015   CESAREAN SECTION     CHOLECYSTECTOMY     KNEE SURGERY  10/2018   TOTAL KNEE ARTHROPLASTY Left 04/12/2020   Procedure: LEFT TOTAL KNEE ARTHROPLASTY;  Surgeon: Kathryne Hitch, MD;  Location: [facility] OR;  Service: Orthopedics;  Laterality: Left;  RNFA    Current Medications: Current Meds  Medication Sig   atorvastatin (LIPITOR) 20 MG tablet Take 1 tablet (20 mg total) by mouth daily.   calcium carbonate (OSCAL) 1500 (600  Ca) MG TABS tablet Take 600 mg of elemental calcium by mouth 2 (two) times daily.   clonazePAM (KLONOPIN) 1 MG tablet Take 1 tablet by mouth at bedtime as needed (sleep/anxiety).   diltiazem (CARDIZEM CD) 360 MG 24 hr capsule TAKE 1 CAPSULE BY MOUTH EVERY DAY   escitalopram (LEXAPRO) 20 MG tablet Take 20 mg by mouth daily.   methocarbamol (ROBAXIN) 750 MG tablet Take 1 tablet (750 mg total) by mouth every 8 (eight) hours as needed for muscle spasms.   pantoprazole (PROTONIX) 40 MG tablet Take 1 tablet by mouth daily.   telmisartan-hydrochlorothiazide (MICARDIS HCT) 80-25 MG tablet Take 1 tablet by mouth daily.     Allergies:   Patient has no known allergies.   Social History   Socioeconomic History   Marital status: Married    Spouse name: Not on file   Number of children: Not on file   Years of education: Not on file   Highest education level: Not on file  Occupational History   Not on file  Tobacco Use   Smoking status: Former    Current packs/day: 0.00    Types: Cigarettes    Quit date: 04/16/1981    Years since quitting: 42.0   Smokeless tobacco: Never  Vaping Use   Vaping status: Never Used  Substance and Sexual  Activity   Alcohol use: No   Drug use: No   Sexual activity: Not on file  Other Topics Concern   Not on file  Social History Narrative   Not on file   Social Drivers of Health   Financial Resource Strain: Not on file  Food Insecurity: Not on file  Transportation Needs: Not on file  Physical Activity: Not on file  Stress: Not on file  Social Connections: Unknown (08/28/2021)   Received from [facility]   Social Network    Social Network: Not on file     Family History: The patient's family history includes Breast cancer in her sister; Coronary artery disease in her father; Lung cancer in her mother. There is no history of Colon cancer, Rectal cancer, Stomach cancer, Pancreatic cancer, or Esophageal cancer.  ROS:   Please see the  history of present illness.    ROS  All other systems reviewed and negative.   EKGs/Labs/Other Studies Reviewed:    The following studies were reviewed today:  EKG Interpretation Date/Time:  Friday May 03 2023 14:41:49 EST Ventricular Rate:  83 PR Interval:  176 QRS Duration:  76 QT Interval:  370 QTC Calculation: 434 R Axis:   32  Text Interpretation: Normal sinus rhythm Low voltage QRS RSR' or QR pattern in V1 suggests right ventricular conduction delay When compared with ECG of 03-Jan-2015 14:17, No significant change was found Confirmed by Armanda Magic ([id number]) on 05/03/2023 3:09:06 PM   Recent Labs: No results found for requested labs within last 365 days.   Recent Lipid Panel    Component Value Date/Time   CHOL 193 08/11/2020 1330   TRIG 64 08/11/2020 1330   HDL 93 08/11/2020 1330   CHOLHDL 2.1 08/11/2020 1330   CHOLHDL 3.3 01/04/2015 0211   VLDL 7 01/04/2015 0211   LDLCALC 88 08/11/2020 1330    Physical Exam:    VS:  BP 128/82   Pulse 83   Ht 5\' 5"  (1.651 m)   Wt 200 lb (90.7 kg)   BMI 33.28 kg/m     Wt Readings from Last 3 Encounters:  05/03/23 200 lb (90.7 kg)  04/12/22 203 lb (92.1 kg)  10/20/21 203 lb (92.1 kg)    GEN: Well nourished, well developed in no acute distress HEENT: Normal NECK: No JVD; No carotid bruits LYMPHATICS: No lymphadenopathy CARDIAC:RRR, no murmurs, rubs, gallops RESPIRATORY:  Clear to auscultation without rales, wheezing or rhonchi  ABDOMEN: Soft, non-tender, non-distended MUSCULOSKELETAL:  No edema; No deformity  SKIN: Warm and dry NEUROLOGIC:  Alert and oriented x 3 PSYCHIATRIC:  Normal affect  ASSESSMENT:    1. Essential hypertension   2. Mixed hyperlipidemia   3. PVC's (premature ventricular contractions)    PLAN:    In order of problems listed above:  1.  HTN -BP is well controlled -continue prescription drug management with Cardizem CD 360mg  daily and Telmisartan HCT 80-25mg  daily with PRN refills -check  BMET  2.  HLD -LDL goal < 100 -check FLP and ALT -continue prescription drug management with Atorvastatin 20mg  daily with PRN refills -continue atorvastatin 10mg  daily  3.  PVCs and PACs -denies any palpitations -Continue Cardizem CD  Followup with me PRN   Medication Adjustments/Labs and Tests Ordered: Current medicines are reviewed at length with the patient today.  Concerns regarding medicines are outlined above.  Orders Placed This Encounter  Procedures   EKG 12-Lead   No orders of the defined types were placed in  this encounter.   Signed, Armanda Magic, MD  05/03/2023 3:17 PM    [facility]

## 2023-05-04 LAB — COMPREHENSIVE METABOLIC PANEL
ALT: 12 [IU]/L (ref 0–32)
AST: 23 [IU]/L (ref 0–40)
Albumin: 4.5 g/dL (ref 3.9–4.9)
Alkaline Phosphatase: 114 [IU]/L (ref 44–121)
BUN/Creatinine Ratio: 21 (ref 12–28)
BUN: 25 mg/dL (ref 8–27)
Bilirubin Total: 0.6 mg/dL (ref 0.0–1.2)
CO2: 22 mmol/L (ref 20–29)
Calcium: 9.4 mg/dL (ref 8.7–10.3)
Chloride: 100 mmol/L (ref 96–106)
Creatinine, Ser: 1.2 mg/dL — ABNORMAL HIGH (ref 0.57–1.00)
Globulin, Total: 2.7 g/dL (ref 1.5–4.5)
Glucose: 88 mg/dL (ref 70–99)
Potassium: 4.5 mmol/L (ref 3.5–5.2)
Sodium: 138 mmol/L (ref 134–144)
Total Protein: 7.2 g/dL (ref 6.0–8.5)
eGFR: 51 mL/min/{1.73_m2} — ABNORMAL LOW (ref >59)

## 2023-05-04 LAB — LIPID PANEL
Chol/HDL Ratio: 2.2 {ratio} (ref 0.0–4.4)
Cholesterol, Total: 179 mg/dL (ref 100–199)
HDL: 83 mg/dL (ref >39)
LDL Chol Calc (NIH): 83 mg/dL (ref 0–99)
Triglycerides: 67 mg/dL (ref 0–149)
VLDL Cholesterol Cal: 13 mg/dL (ref 5–40)

## 2023-05-07 ENCOUNTER — Telehealth: Payer: Self-pay | Admitting: Cardiology

## 2023-05-07 NOTE — Telephone Encounter (Signed)
Reviewed lipid panel and CMET results 05/03/23, per Dr. Mayford Knife, normal labs and continue current medications. Patient verbalizes understanding.

## 2023-05-07 NOTE — Telephone Encounter (Signed)
Patient is returning call to discuss lab results. 

## 2023-05-24 ENCOUNTER — Other Ambulatory Visit: Payer: Self-pay | Admitting: Cardiology

## 2023-05-28 DIAGNOSIS — E7800 Pure hypercholesterolemia, unspecified: Secondary | ICD-10-CM | POA: Diagnosis not present

## 2023-05-28 DIAGNOSIS — Z6834 Body mass index (BMI) 34.0-34.9, adult: Secondary | ICD-10-CM | POA: Diagnosis not present

## 2023-05-28 DIAGNOSIS — B182 Chronic viral hepatitis C: Secondary | ICD-10-CM | POA: Diagnosis not present

## 2023-05-28 DIAGNOSIS — F419 Anxiety disorder, unspecified: Secondary | ICD-10-CM | POA: Diagnosis not present

## 2023-05-28 DIAGNOSIS — M542 Cervicalgia: Secondary | ICD-10-CM | POA: Diagnosis not present

## 2023-05-28 DIAGNOSIS — I10 Essential (primary) hypertension: Secondary | ICD-10-CM | POA: Diagnosis not present

## 2023-05-28 DIAGNOSIS — K219 Gastro-esophageal reflux disease without esophagitis: Secondary | ICD-10-CM | POA: Diagnosis not present

## 2023-05-28 DIAGNOSIS — N1831 Chronic kidney disease, stage 3a: Secondary | ICD-10-CM | POA: Diagnosis not present

## 2023-06-08 ENCOUNTER — Ambulatory Visit
Admission: EM | Admit: 2023-06-08 | Discharge: 2023-06-08 | Disposition: A | Discharge status: Home / Self Care | Payer: 59 | Attending: Family Medicine | Admitting: Family Medicine

## 2023-06-08 ENCOUNTER — Other Ambulatory Visit: Payer: Self-pay

## 2023-06-08 DIAGNOSIS — M436 Torticollis: Secondary | ICD-10-CM

## 2023-06-08 MED ORDER — TIZANIDINE HCL 4 MG PO TABS: 4.0000 mg | ORAL_TABLET | Freq: Three times a day (TID) | ORAL | 0 refills | Status: AC | PRN

## 2023-06-08 MED ORDER — MELOXICAM 7.5 MG PO TABS: 7.5000 mg | ORAL_TABLET | Freq: Every day | ORAL | 0 refills | Status: AC

## 2023-06-08 MED ORDER — TIZANIDINE HCL 4 MG PO TABS: 4.0000 mg | ORAL_TABLET | Freq: Three times a day (TID) | ORAL | 0 refills | Status: DC | PRN

## 2023-06-08 NOTE — ED Triage Notes (Signed)
 Pt presents with complaints of sharp left-sided neck pain x 4 days. OTC Tylenol taken with no relief. Pt currently rates her neck pain a 10/10. Denies any recent injuries.

## 2023-06-08 NOTE — ED Provider Notes (Signed)
 [facility]    CSN: [id number] Arrival date & time: 06/08/23  1310      History   Chief Complaint No chief complaint on file.   HPI Caroline Keller is a 63 y.o. female.   HPI Left-sided neck pain since 05/28/2023, gradual onset, constant, described as sharp, admits neck stiffness states the pain is worse with certain movements or positions particularly with turning to the left, is relieved with certain positions.  She has been treating it with over-the-counter Tylenol, ice and heat application.  Saw PCP several days ago states they gave her a shot of an anti-inflammatory which helped for short period of time.  Denies history of injury, similar neck pain in the past paresthesias, weakness, headache, dizziness, change in vision, URI symptoms, nausea, vomiting, diarrhea.  Denies chest pain or short. States she is feeling really nervous right now, has a history of panic attacks and she started to feel nervous about the severity of her pain about coming to urgent care.  She usually takes Klonopin for her anxiety/panic. Past Medical History:  Diagnosis Date   Anxiety    Arthritis of low back    GERD (gastroesophageal reflux disease)    Hepatitis C    History of C-section    History of cholecystectomy    History of hiatal hernia    Hyperlipidemia    Hypertension    PAC (premature atrial contraction)    Palpitations    PONV (postoperative nausea and vomiting)    PVC (premature ventricular contraction)     Patient Active Problem List   Diagnosis Date Noted   Closed fracture of phalanx of right great toe 04/12/2022   GERD (gastroesophageal reflux disease) 01/17/2021   Status post total left knee replacement 04/12/2020   Unilateral primary osteoarthritis, left knee 02/02/2019   S/P arthroscopy of left knee 11/06/2018   Unilateral primary osteoarthritis, right knee 03/19/2018   Hyperlipidemia 02/25/2017   Chronic left-sided low back pain with left-sided sciatica 09/13/2016    Trigger thumb, left thumb 09/13/2016   Trigger thumb, right thumb 09/13/2016   Substernal chest pain 01/03/2015   PVC's (premature ventricular contractions) 03/23/2014   PAC (premature atrial contraction) 03/23/2014   Benign essential HTN 03/23/2014   Hepatitis C 12/08/2013    Past Surgical History:  Procedure Laterality Date   CARDIOVASCULAR STRESS TEST  01/04/2015   CESAREAN SECTION     CHOLECYSTECTOMY     KNEE SURGERY  10/2018   TOTAL KNEE ARTHROPLASTY Left 04/12/2020   Procedure: LEFT TOTAL KNEE ARTHROPLASTY;  Surgeon: Kathryne Hitch, MD;  Location: [facility] OR;  Service: Orthopedics;  Laterality: Left;  RNFA    OB History   No obstetric history on file.      Home Medications    Prior to Admission medications   Medication Sig Start Date End Date Taking? Authorizing Provider  atorvastatin (LIPITOR) 20 MG tablet Take 1 tablet (20 mg total) by mouth daily. 06/19/22   Meriam Sprague, MD  calcium carbonate (OSCAL) 1500 (600 Ca) MG TABS tablet Take 600 mg of elemental calcium by mouth 2 (two) times daily.    [provider]  clonazePAM (KLONOPIN) 1 MG tablet Take 1 tablet by mouth at bedtime as needed (sleep/anxiety). 02/05/17   [provider]  diltiazem (CARDIZEM CD) 360 MG 24 hr capsule Take 1 capsule (360 mg total) by mouth daily. 05/24/23   Quintella Reichert, MD  escitalopram (LEXAPRO) 20 MG tablet Take 20 mg by mouth daily. 01/06/16  [provider]  methocarbamol (ROBAXIN) 750 MG tablet Take 1 tablet (750 mg total) by mouth every 8 (eight) hours as needed for muscle spasms. 01/15/22   Kathryne Hitch, MD  pantoprazole (PROTONIX) 40 MG tablet Take 1 tablet by mouth daily. 08/09/20   [provider]  telmisartan-hydrochlorothiazide (MICARDIS HCT) 80-25 MG tablet Take 1 tablet by mouth daily. 02/11/23   Quintella Reichert, MD    Family History Family History  Problem Relation Age of Onset   Lung cancer Mother    Coronary artery  disease Father    Breast cancer Sister    Colon cancer Neg Hx    Rectal cancer Neg Hx    Stomach cancer Neg Hx    Pancreatic cancer Neg Hx    Esophageal cancer Neg Hx     Social History Social History   Tobacco Use   Smoking status: Former    Current packs/day: 0.00    Types: Cigarettes    Quit date: 04/16/1981    Years since quitting: 42.1   Smokeless tobacco: Never  Vaping Use   Vaping status: Never Used  Substance Use Topics   Alcohol use: No   Drug use: No     Allergies   Patient has no known allergies.   Review of Systems Review of Systems   Physical Exam Triage Vital Signs ED Triage Vitals [06/08/23 1324]  Encounter Vitals Group     BP (!) 86/58     Systolic BP Percentile      Diastolic BP Percentile      Pulse Rate (!) 116     Resp 20     Temp 98.6 F (37 C)     Temp Source Oral     SpO2 95 %     Weight      Height      Head Circumference      Peak Flow      Pain Score      Pain Loc      Pain Education      Exclude from Growth Chart    No data found.  Updated Vital Signs BP (!) 86/58 (BP Location: Right Arm)   Pulse (!) 116   Temp 98.6 F (37 C) (Oral)   Resp 20   SpO2 95%   Visual Acuity Right Eye Distance:   Left Eye Distance:   Bilateral Distance:    Right Eye Near:   Left Eye Near:    Bilateral Near:     Physical Exam Vitals and nursing note reviewed.  Constitutional:      Comments: Tremulous, no distress  HENT:     Head: Normocephalic and atraumatic.     Right Ear: Tympanic membrane and ear canal normal.     Left Ear: Tympanic membrane normal.     Nose: No rhinorrhea.     Mouth/Throat:     Mouth: Mucous membranes are moist.  Eyes:     Extraocular Movements: Extraocular movements intact.     Conjunctiva/sclera: Conjunctivae normal.     Pupils: Pupils are equal, round, and reactive to light.  Neck:     Comments: Mild muscle tenderness left side with some spasm left trapezius, no midline bony tenderness.  There is  limited flexion and left lateral rotation Cardiovascular:     Rate and Rhythm: Normal rate and regular rhythm.     Heart sounds: Normal heart sounds.  Pulmonary:     Effort: Pulmonary effort is normal.  Neurological:  Mental Status: She is alert and oriented to person, place, and time.     Sensory: No sensory deficit.     Motor: No weakness.  Psychiatric:     Comments: Anxious      UC Treatments / Results  Labs (all labs ordered are listed, but only abnormal results are displayed) Labs Reviewed - No data to display  EKG   Radiology No results found.  Procedures Procedures (including critical care time)  Medications Ordered in UC Medications - No data to display  Initial Impression / Assessment and Plan / UC Course  I have reviewed the triage vital signs and the nursing notes.  Pertinent labs & imaging results that were available during my care of the patient were reviewed by me and considered in my medical decision making (see chart for details).     63 year old female with neck pain and stiffness for 11 days no injury.  Discussed with patient suspect for tocolysis, will treat with muscle relaxers.  She has a history of GERD will treat with once daily meloxicam which she should take with food, recommend continued heat application, follow-up with PCP if no improvement Final Clinical Impressions(s) / UC Diagnoses   Final diagnoses:  None   Discharge Instructions   None    ED Prescriptions   None    PDMP not reviewed this encounter.   Meliton Rattan, [place] 06/08/23 1400

## 2023-06-27 ENCOUNTER — Other Ambulatory Visit: Payer: Self-pay

## 2023-06-27 DIAGNOSIS — E782 Mixed hyperlipidemia: Secondary | ICD-10-CM

## 2023-06-27 MED ORDER — ATORVASTATIN CALCIUM 20 MG PO TABS: 20.0000 mg | ORAL_TABLET | Freq: Every day | ORAL | 3 refills | Status: AC

## 2023-07-07 ENCOUNTER — Other Ambulatory Visit: Payer: Self-pay | Admitting: Cardiology

## 2023-08-07 DIAGNOSIS — H40013 Open angle with borderline findings, low risk, bilateral: Secondary | ICD-10-CM | POA: Diagnosis not present

## 2023-08-07 DIAGNOSIS — Z83518 Family history of other specified eye disorder: Secondary | ICD-10-CM | POA: Diagnosis not present

## 2023-08-07 DIAGNOSIS — H25813 Combined forms of age-related cataract, bilateral: Secondary | ICD-10-CM | POA: Diagnosis not present

## 2023-10-22 DIAGNOSIS — H268 Other specified cataract: Secondary | ICD-10-CM | POA: Diagnosis not present

## 2023-10-22 DIAGNOSIS — H25813 Combined forms of age-related cataract, bilateral: Secondary | ICD-10-CM | POA: Diagnosis not present

## 2023-10-22 DIAGNOSIS — H2512 Age-related nuclear cataract, left eye: Secondary | ICD-10-CM | POA: Diagnosis not present

## 2023-10-31 DIAGNOSIS — H25811 Combined forms of age-related cataract, right eye: Secondary | ICD-10-CM | POA: Diagnosis not present

## 2023-11-05 DIAGNOSIS — H268 Other specified cataract: Secondary | ICD-10-CM | POA: Diagnosis not present

## 2023-11-05 DIAGNOSIS — H25811 Combined forms of age-related cataract, right eye: Secondary | ICD-10-CM | POA: Diagnosis not present

## 2023-12-19 DIAGNOSIS — I10 Essential (primary) hypertension: Secondary | ICD-10-CM | POA: Diagnosis not present

## 2023-12-19 DIAGNOSIS — Z1331 Encounter for screening for depression: Secondary | ICD-10-CM | POA: Diagnosis not present

## 2023-12-19 DIAGNOSIS — E7800 Pure hypercholesterolemia, unspecified: Secondary | ICD-10-CM | POA: Diagnosis not present

## 2023-12-19 DIAGNOSIS — F419 Anxiety disorder, unspecified: Secondary | ICD-10-CM | POA: Diagnosis not present

## 2023-12-19 DIAGNOSIS — K219 Gastro-esophageal reflux disease without esophagitis: Secondary | ICD-10-CM | POA: Diagnosis not present

## 2024-01-15 DIAGNOSIS — N3001 Acute cystitis with hematuria: Secondary | ICD-10-CM | POA: Diagnosis not present

## 2024-02-17 ENCOUNTER — Encounter: Payer: Self-pay | Admitting: Radiology

## 2024-05-02 ENCOUNTER — Encounter: Payer: Self-pay | Admitting: Emergency Medicine

## 2024-05-02 ENCOUNTER — Ambulatory Visit: Admission: EM | Admit: 2024-05-02 | Discharge: 2024-05-02 | Disposition: A | Discharge status: Home / Self Care

## 2024-05-02 ENCOUNTER — Ambulatory Visit: Admitting: Radiology

## 2024-05-02 DIAGNOSIS — M79675 Pain in left toe(s): Secondary | ICD-10-CM

## 2024-05-02 DIAGNOSIS — S92415A Nondisplaced fracture of proximal phalanx of left great toe, initial encounter for closed fracture: Secondary | ICD-10-CM | POA: Diagnosis not present

## 2024-05-02 NOTE — ED Provider Notes (Signed)
 [facility]    CSN: [id number] Arrival date & time: 05/02/24  1519      History   Chief Complaint Chief Complaint  Patient presents with   Toe Injury    HPI Caroline Keller is a 64 y.o. female.   Caroline Keller presents today with complaint of pain in the left great toe that began today after a fall.  She reports that she was going down the stairs when she fell  onto her left foot.  She has had left great toe pain since the fall.  She reports mild intermittent throbbing pain at rest and moderate to severe pain with ambulation.  She reports the left great toe is bruised, but has not had any numbness or tingling.  She denies pain elsewhere.  She reports that she has broken the toe previously many years ago.  She has not taking anything over-the-counter for her pain  The history is provided by the patient.    Past Medical History:  Diagnosis Date   Anxiety    Arthritis of low back    GERD (gastroesophageal reflux disease)    Hepatitis C    History of C-section    History of cholecystectomy    History of hiatal hernia    Hyperlipidemia    Hypertension    PAC (premature atrial contraction)    Palpitations    PONV (postoperative nausea and vomiting)    PVC (premature ventricular contraction)     Patient Active Problem List   Diagnosis Date Noted   Closed fracture of phalanx of right great toe 04/12/2022   GERD (gastroesophageal reflux disease) 01/17/2021   Status post total left knee replacement 04/12/2020   Unilateral primary osteoarthritis, left knee 02/02/2019   S/P arthroscopy of left knee 11/06/2018   Unilateral primary osteoarthritis, right knee 03/19/2018   Hyperlipidemia 02/25/2017   Chronic left-sided low back pain with left-sided sciatica 09/13/2016   Trigger thumb, left thumb 09/13/2016   Trigger thumb, right thumb 09/13/2016   Substernal chest pain 01/03/2015   PVC's (premature ventricular contractions) 03/23/2014   PAC (premature atrial contraction)  03/23/2014   Benign essential HTN 03/23/2014   Hepatitis C 12/08/2013    Past Surgical History:  Procedure Laterality Date   CARDIOVASCULAR STRESS TEST  01/04/2015   CESAREAN SECTION     CHOLECYSTECTOMY     KNEE SURGERY  10/2018   TOTAL KNEE ARTHROPLASTY Left 04/12/2020   Procedure: LEFT TOTAL KNEE ARTHROPLASTY;  Surgeon: Vernetta Lonni GRADE, MD;  Location: [facility] OR;  Service: Orthopedics;  Laterality: Left;  RNFA    OB History   No obstetric history on file.      Home Medications    Prior to Admission medications  Medication Sig Start Date End Date Taking? Authorizing Provider  atorvastatin  (LIPITOR) 20 MG tablet Take 1 tablet (20 mg total) by mouth daily. 06/27/23   Shlomo Wilbert SAUNDERS, MD  calcium  carbonate (OSCAL) 1500 (600 Ca) MG TABS tablet Take 600 mg of elemental calcium  by mouth 2 (two) times daily.    [provider]  clonazePAM  (KLONOPIN ) 1 MG tablet Take 1 tablet by mouth at bedtime as needed (sleep/anxiety). 02/05/17   [provider]  diltiazem  (CARDIZEM  CD) 360 MG 24 hr capsule Take 1 capsule (360 mg total) by mouth daily. 05/24/23   Shlomo Wilbert SAUNDERS, MD  pantoprazole  (PROTONIX ) 40 MG tablet Take 1 tablet by mouth daily. 08/09/20   [provider]  telmisartan -hydrochlorothiazide  (MICARDIS  HCT) 80-25 MG tablet TAKE  1 TABLET BY MOUTH EVERY DAY 07/08/23   Shlomo Wilbert SAUNDERS, MD  tiZANidine  (ZANAFLEX ) 4 MG tablet Take 1 tablet (4 mg total) by mouth every 8 (eight) hours as needed for muscle spasms (neck pain). 06/08/23   Acevedo, Angela, PA    Family History Family History  Problem Relation Age of Onset   Lung cancer Mother    Coronary artery disease Father    Breast cancer Sister    Colon cancer Neg Hx    Rectal cancer Neg Hx    Stomach cancer Neg Hx    Pancreatic cancer Neg Hx    Esophageal cancer Neg Hx     Social History Social History[1]   Allergies   Patient has no known allergies.   Review of Systems Review of Systems   Constitutional: Negative.   Musculoskeletal:  Positive for arthralgias, gait problem and joint swelling.  Skin:  Positive for color change.  Neurological:  Negative for dizziness, weakness, light-headedness, numbness and headaches.     Physical Exam Triage Vital Signs ED Triage Vitals  Encounter Vitals Group     BP 05/02/24 1527 114/75     Girls Systolic BP Percentile --      Girls Diastolic BP Percentile --      Boys Systolic BP Percentile --      Boys Diastolic BP Percentile --      Pulse Rate 05/02/24 1527 (!) 114     Resp 05/02/24 1527 20     Temp 05/02/24 1527 98.1 F (36.7 C)     Temp Source 05/02/24 1527 Oral     SpO2 --      Weight --      Height --      Head Circumference --      Peak Flow --      Pain Score 05/02/24 1540 8     Pain Loc --      Pain Education --      Exclude from Growth Chart --    No data found.  Updated Vital Signs BP 114/75 (BP Location: Right Arm)   Pulse (!) 114   Temp 98.1 F (36.7 C) (Oral)   Resp 20   Visual Acuity Right Eye Distance:   Left Eye Distance:   Bilateral Distance:    Right Eye Near:   Left Eye Near:    Bilateral Near:     Physical Exam Vitals and nursing note reviewed.  Constitutional:      General: She is not in acute distress.    Appearance: Normal appearance. She is normal weight. She is not toxic-appearing.  Eyes:     Conjunctiva/sclera: Conjunctivae normal.  Cardiovascular:     Rate and Rhythm: Normal rate and regular rhythm.     Heart sounds: Normal heart sounds.  Pulmonary:     Effort: Pulmonary effort is normal.     Breath sounds: Normal breath sounds and air entry.  Musculoskeletal:     Lumbar back: Decreased range of motion: Guarded due to pain. Negative right straight leg raise test and negative left straight leg raise test.     Left foot: Normal capillary refill. Swelling, prominent metatarsal heads, tenderness and bony tenderness present. No laceration. Normal pulse.     Comments: Diffuse  swelling and tenderness to left great toe  Skin:    General: Skin is warm and dry.     Findings: Bruising (Dorsal aspect of left great toe) present.  Neurological:     Mental Status: She  is alert and oriented to person, place, and time.  Psychiatric:        Mood and Affect: Mood normal.        Behavior: Behavior normal.      UC Treatments / Results  Labs (all labs ordered are listed, but only abnormal results are displayed) Labs Reviewed - No data to display  EKG   Radiology DG Foot Complete Left Result Date: 05/02/2024 EXAM: 3 OR MORE VIEW(S) XRAY OF THE LEFT FOOT 05/02/2024 03:40:05 PM COMPARISON: None available. CLINICAL HISTORY: Trauma to foot with pain in toe. FINDINGS: BONES AND JOINTS: Linear lucency traversing the middle of the first proximal phalanx compatible with nondisplaced fracture. Hallux valgus deformity. Calcaneal spur. Degenerative changes of the first metatarsophalangeal joint. No malalignment. SOFT TISSUES: Forefoot soft tissue swelling. IMPRESSION: 1. Nondisplaced fracture of the first proximal phalanx. 2. Hallux valgus deformity with degenerative changes of the first metatarsophalangeal joint. Electronically signed by: Greig Pique MD 05/02/2024 04:09 PM EST RP Workstation: [id number]    Procedures Procedures (including critical care time)  Medications Ordered in UC Medications - No data to display  Initial Impression / Assessment and Plan / UC Course  I have reviewed the triage vital signs and the nursing notes.  Pertinent labs & imaging results that were available during my care of the patient were reviewed by me and considered in my medical decision making (see chart for details).     Nondisplaced fracture of the first proximal phalanx Identified on three-view x-ray of the left foot.  Caroline Keller was provided with a postop shoe to immobilize the big toe and assist in weightbearing.  She is advised that she may weight-bear as tolerated as well as postop  shoe was in place.  She is established with orthopedics, and I advised that she make a follow-up appointment to ensure proper healing.  She may take 1000 mg of Tylenol  every 6-8 hours as needed for pain not exceeding 4000 mg in 24 hours. Final Clinical Impressions(s) / UC Diagnoses   Final diagnoses:  Toe pain, left  Closed nondisplaced fracture of proximal phalanx of left great toe, initial encounter     Discharge Instructions       Diagnosis Nondisplaced fracture of the left great toe  What This Means The bone in your big toe is cracked but remains in proper alignment. Surgery is not needed. This type of fracture typically heals well with protection and time.  Treatment Plan - Wear the post-op shoe as directed to protect the toe - Weight bearing as tolerated (WBAT) -- you may walk on the foot as long as pain allows - Take acetaminophen  (Tylenol ) 1000mg  every 6-8 hours as needed for pain, following label instructions. Do not exceed 4000mg  of tylenol  a day   Activity - Avoid running, jumping, or impact activities - Keep the foot elevated when resting to reduce swelling - You may return to normal activities gradually as pain improves  Expected Recovery - Pain and swelling usually improve over 1-2 weeks - Bone healing typically takes 4-6 weeks - Full return to sports or high-impact activity may take longer depending on comfort  When to Seek Care - Increasing pain or swelling - Numbness, color change, or worsening bruising - Trouble walking despite the post-op shoe - Signs of infection (redness, warmth, drainage)  Follow-Up Follow up as directed or if symptoms are not improving as expected.     ED Prescriptions   None    PDMP not reviewed this encounter.    [  1]  Social History Tobacco Use   Smoking status: Former    Current packs/day: 0.00    Types: Cigarettes    Quit date: 04/16/1981    Years since quitting: 43.0   Smokeless tobacco: Never  Vaping Use    Vaping status: Never Used  Substance Use Topics   Alcohol use: No   Drug use: No     Leatrice Vernell HERO, NP 05/02/24 1618  "

## 2024-05-02 NOTE — Discharge Instructions (Addendum)
" °  Diagnosis Nondisplaced fracture of the left great toe  What This Means The bone in your big toe is cracked but remains in proper alignment. Surgery is not needed. This type of fracture typically heals well with protection and time.  Treatment Plan - Wear the post-op shoe as directed to protect the toe - Weight bearing as tolerated (WBAT) -- you may walk on the foot as long as pain allows - Take acetaminophen  (Tylenol ) 1000mg  every 6-8 hours as needed for pain, following label instructions. Do not exceed 4000mg  of tylenol  a day   Activity - Avoid running, jumping, or impact activities - Keep the foot elevated when resting to reduce swelling - You may return to normal activities gradually as pain improves  Expected Recovery - Pain and swelling usually improve over 1-2 weeks - Bone healing typically takes 4-6 weeks - Full return to sports or high-impact activity may take longer depending on comfort  When to Seek Care - Increasing pain or swelling - Numbness, color change, or worsening bruising - Trouble walking despite the post-op shoe - Signs of infection (redness, warmth, drainage)  Follow-Up Follow up as directed or if symptoms are not improving as expected.  "

## 2024-05-02 NOTE — ED Triage Notes (Addendum)
 Pt fell while going down a few steps this morning and landed on her bottom with her left foot under her. Pt c/o sharp pains in big toe.

## 2024-05-04 ENCOUNTER — Ambulatory Visit (HOSPITAL_COMMUNITY): Payer: Self-pay

## 2024-05-08 ENCOUNTER — Encounter: Payer: Self-pay | Admitting: Physician Assistant

## 2024-05-08 ENCOUNTER — Ambulatory Visit: Admitting: Physician Assistant

## 2024-05-08 DIAGNOSIS — S92402A Displaced unspecified fracture of left great toe, initial encounter for closed fracture: Secondary | ICD-10-CM

## 2024-05-08 NOTE — Progress Notes (Signed)
 "  Office Visit Note   Patient: Caroline Keller           Date of Birth: 10-Jun-1960           MRN: [id number] Visit Date: 05/08/2024              Requested by: Katina Pfeiffer, PA-C [address] [place],  [place] [address] PCP: Katina Pfeiffer, PA-C  Chief Complaint  Patient presents with   Left Foot - Fracture    Great toe      HPI: 64 y/o female here s/p fall down steps and sustained a GT fracture.  The injury occurred on 05/02/24.  She was seen at an urgent care and placed in a post op shoe for protection and splinting of the GT.  She denies much pain.  She does have bruising at the base of all the toes.  She denies any other pain otherwise and is not taking medication.    Assessment & Plan: Visit Diagnoses:  1. Closed displaced fracture of phalanx of left great toe, unspecified phalanx, initial encounter     Plan: Left first proximal phalanx minimally displaced fracture. Ambulate in the post op shoe, elevate for edema.  Tylenol  PRN for pain.  She will be out of work for 3 weeks due to the fact she is not allowed to wear the post op shoe to work.  I recommended she get a carbon fiber insert and then she can wean into her tennis shoe for work.    She will f/u in 3 weeks for exam and pending exam we will check a repeat x ray for healing.  Follow-Up Instructions: Return in about 3 weeks (around 05/29/2024) for left foot xray.   Ortho Exam  Patient is alert, oriented, no adenopathy, well-dressed, normal affect, normal respiratory effort. Minimal edema in the left GT.  No edema in the foot with good skin lines and palpable DP pulse.  Tenderness to touch and motion.  No obvious external deformity.  Minimal ecchymosis base of GT and lesser toes.      Imaging: X rays taken 05/02/24 show a minimal displaced GT fracture without interarticular involvement.  IMPRESSION: 1. Nondisplaced fracture of the first proximal phalanx. 2. Hallux valgus deformity with degenerative changes of  the first metatarsophalangeal joint.  Labs: Lab Results  Component Value Date   REPTSTATUS 09/15/2013 FINAL 09/13/2013   CULT  09/13/2013    No Beta Hemolytic Streptococci Isolated Performed at [facility]     Lab Results  Component Value Date   ALBUMIN 4.5 05/03/2023   ALBUMIN 4.7 08/11/2020   ALBUMIN 4.9 06/17/2019    No results found for: MG No results found for: VD25OH  No results found for: PREALBUMIN    Latest Ref Rng & Units 04/13/2020    4:40 AM 04/06/2020   11:34 AM 06/17/2019   11:28 AM  CBC EXTENDED  WBC 4.0 - 10.5 K/uL 10.7  5.9  6.6   RBC 3.87 - 5.11 MIL/uL 3.29  3.92  3.99   Hemoglobin 12.0 - 15.0 g/dL 89.1  87.0  86.8   HCT 36.0 - 46.0 % 33.7  39.0  37.0   Platelets 150 - 400 K/uL 228  235  262      There is no height or weight on file to calculate BMI.  Orders:  No orders of the defined types were placed in this encounter.  No orders of the defined types were placed in this encounter.  Procedures: No procedures performed  Clinical Data: No additional findings.  ROS:  All other systems negative, except as noted in the HPI. Review of Systems  Objective: Vital Signs: There were no vitals taken for this visit.  Specialty Comments:  No specialty comments available.  PMFS History: Patient Active Problem List   Diagnosis Date Noted   Closed fracture of phalanx of right great toe 04/12/2022   GERD (gastroesophageal reflux disease) 01/17/2021   Status post total left knee replacement 04/12/2020   Unilateral primary osteoarthritis, left knee 02/02/2019   S/P arthroscopy of left knee 11/06/2018   Unilateral primary osteoarthritis, right knee 03/19/2018   Hyperlipidemia 02/25/2017   Chronic left-sided low back pain with left-sided sciatica 09/13/2016   Trigger thumb, left thumb 09/13/2016   Trigger thumb, right thumb 09/13/2016   Substernal chest pain 01/03/2015   PVC's (premature ventricular contractions) 03/23/2014    PAC (premature atrial contraction) 03/23/2014   Benign essential HTN 03/23/2014   Hepatitis C 12/08/2013   Past Medical History:  Diagnosis Date   Anxiety    Arthritis of low back    GERD (gastroesophageal reflux disease)    Hepatitis C    History of C-section    History of cholecystectomy    History of hiatal hernia    Hyperlipidemia    Hypertension    PAC (premature atrial contraction)    Palpitations    PONV (postoperative nausea and vomiting)    PVC (premature ventricular contraction)     Family History  Problem Relation Age of Onset   Lung cancer Mother    Coronary artery disease Father    Breast cancer Sister    Colon cancer Neg Hx    Rectal cancer Neg Hx    Stomach cancer Neg Hx    Pancreatic cancer Neg Hx    Esophageal cancer Neg Hx     Past Surgical History:  Procedure Laterality Date   CARDIOVASCULAR STRESS TEST  01/04/2015   CESAREAN SECTION     CHOLECYSTECTOMY     KNEE SURGERY  10/2018   TOTAL KNEE ARTHROPLASTY Left 04/12/2020   Procedure: LEFT TOTAL KNEE ARTHROPLASTY;  Surgeon: Vernetta Lonni GRADE, MD;  Location: [facility] OR;  Service: Orthopedics;  Laterality: Left;  RNFA   Social History   Occupational History   Not on file  Tobacco Use   Smoking status: Former    Current packs/day: 0.00    Types: Cigarettes    Quit date: 04/16/1981    Years since quitting: 43.0   Smokeless tobacco: Never  Vaping Use   Vaping status: Never Used  Substance and Sexual Activity   Alcohol use: No   Drug use: No   Sexual activity: Not on file       "

## 2024-05-29 ENCOUNTER — Ambulatory Visit: Admitting: Physician Assistant
